# Patient Record
Sex: Female | Born: 1935 | Race: White | Hispanic: No | Smoking: Former smoker
Health system: Southern US, Community
[De-identification: ages and names within clinical notes are randomized; demographics above are authoritative.]

## PROBLEM LIST (undated history)

## (undated) DIAGNOSIS — C49A0 Gastrointestinal stromal tumor, unspecified site: Secondary | ICD-10-CM

## (undated) DIAGNOSIS — C679 Malignant neoplasm of bladder, unspecified: Secondary | ICD-10-CM

## (undated) DIAGNOSIS — E039 Hypothyroidism, unspecified: Secondary | ICD-10-CM

## (undated) DIAGNOSIS — K3189 Other diseases of stomach and duodenum: Secondary | ICD-10-CM

## (undated) DIAGNOSIS — I10 Essential (primary) hypertension: Secondary | ICD-10-CM

## (undated) DIAGNOSIS — C4490 Unspecified malignant neoplasm of skin, unspecified: Secondary | ICD-10-CM

## (undated) DIAGNOSIS — I499 Cardiac arrhythmia, unspecified: Secondary | ICD-10-CM

## (undated) DIAGNOSIS — K2270 Barrett's esophagus without dysplasia: Secondary | ICD-10-CM

## (undated) DIAGNOSIS — R413 Other amnesia: Principal | ICD-10-CM

## (undated) HISTORY — DX: Other amnesia: R41.3

## (undated) HISTORY — DX: Barrett's esophagus without dysplasia: K22.70

## (undated) HISTORY — PX: OTHER SURGICAL HISTORY: SHX169

## (undated) HISTORY — DX: Unspecified malignant neoplasm of skin, unspecified: C44.90

## (undated) HISTORY — PX: UPPER GASTROINTESTINAL ENDOSCOPY: SHX188

## (undated) HISTORY — PX: COLONOSCOPY: SHX174

## (undated) HISTORY — PX: CATARACT EXTRACTION, BILATERAL: SHX1313

## (undated) HISTORY — DX: Malignant neoplasm of bladder, unspecified: C67.9

## (undated) HISTORY — DX: Gastrointestinal stromal tumor, unspecified site: C49.A0

---

## 1997-04-15 ENCOUNTER — Other Ambulatory Visit: Admission: RE | Admit: 1997-04-15 | Discharge: 1997-04-15 | Payer: Self-pay | Admitting: Internal Medicine

## 1998-10-15 ENCOUNTER — Ambulatory Visit (HOSPITAL_COMMUNITY): Admission: RE | Admit: 1998-10-15 | Discharge: 1998-10-15 | Payer: Self-pay | Admitting: *Deleted

## 1998-10-15 ENCOUNTER — Encounter (INDEPENDENT_AMBULATORY_CARE_PROVIDER_SITE_OTHER): Payer: Self-pay | Admitting: Specialist

## 1999-04-18 ENCOUNTER — Encounter: Admission: RE | Admit: 1999-04-18 | Discharge: 1999-04-18 | Payer: Self-pay | Admitting: Internal Medicine

## 1999-04-18 ENCOUNTER — Encounter: Payer: Self-pay | Admitting: Internal Medicine

## 2000-04-19 ENCOUNTER — Encounter: Admission: RE | Admit: 2000-04-19 | Discharge: 2000-04-19 | Payer: Self-pay | Admitting: Internal Medicine

## 2000-04-19 ENCOUNTER — Encounter: Payer: Self-pay | Admitting: Internal Medicine

## 2000-05-24 ENCOUNTER — Other Ambulatory Visit: Admission: RE | Admit: 2000-05-24 | Discharge: 2000-05-24 | Payer: Self-pay | Admitting: Internal Medicine

## 2000-06-08 ENCOUNTER — Other Ambulatory Visit: Admission: RE | Admit: 2000-06-08 | Discharge: 2000-06-08 | Payer: Self-pay | Admitting: Obstetrics and Gynecology

## 2000-06-08 ENCOUNTER — Encounter (INDEPENDENT_AMBULATORY_CARE_PROVIDER_SITE_OTHER): Payer: Self-pay

## 2001-04-10 ENCOUNTER — Encounter: Payer: Self-pay | Admitting: Internal Medicine

## 2001-04-10 ENCOUNTER — Encounter: Admission: RE | Admit: 2001-04-10 | Discharge: 2001-04-10 | Payer: Self-pay | Admitting: Internal Medicine

## 2001-11-02 ENCOUNTER — Encounter: Admission: RE | Admit: 2001-11-02 | Discharge: 2001-11-02 | Payer: Self-pay | Admitting: Orthopedic Surgery

## 2001-11-02 ENCOUNTER — Encounter: Payer: Self-pay | Admitting: Orthopedic Surgery

## 2002-04-25 ENCOUNTER — Encounter: Admission: RE | Admit: 2002-04-25 | Discharge: 2002-04-25 | Payer: Self-pay | Admitting: Internal Medicine

## 2002-04-25 ENCOUNTER — Encounter: Payer: Self-pay | Admitting: Internal Medicine

## 2003-06-22 ENCOUNTER — Other Ambulatory Visit: Admission: RE | Admit: 2003-06-22 | Discharge: 2003-06-22 | Payer: Self-pay | Admitting: Internal Medicine

## 2004-05-25 ENCOUNTER — Encounter: Admission: RE | Admit: 2004-05-25 | Discharge: 2004-05-25 | Payer: Self-pay | Admitting: Internal Medicine

## 2005-08-03 ENCOUNTER — Encounter: Admission: RE | Admit: 2005-08-03 | Discharge: 2005-08-03 | Payer: Self-pay | Admitting: Internal Medicine

## 2006-08-21 ENCOUNTER — Encounter: Admission: RE | Admit: 2006-08-21 | Discharge: 2006-08-21 | Payer: Self-pay | Admitting: Internal Medicine

## 2007-09-25 ENCOUNTER — Encounter: Admission: RE | Admit: 2007-09-25 | Discharge: 2007-09-25 | Payer: Self-pay | Admitting: Internal Medicine

## 2008-10-21 ENCOUNTER — Encounter: Admission: RE | Admit: 2008-10-21 | Discharge: 2008-10-21 | Payer: Self-pay | Admitting: Internal Medicine

## 2009-10-22 ENCOUNTER — Encounter: Admission: RE | Admit: 2009-10-22 | Discharge: 2009-10-22 | Payer: Self-pay | Admitting: Internal Medicine

## 2010-11-09 ENCOUNTER — Other Ambulatory Visit: Payer: Self-pay | Admitting: Internal Medicine

## 2010-11-09 DIAGNOSIS — Z1231 Encounter for screening mammogram for malignant neoplasm of breast: Secondary | ICD-10-CM

## 2010-11-18 ENCOUNTER — Ambulatory Visit
Admission: RE | Admit: 2010-11-18 | Discharge: 2010-11-18 | Disposition: A | Discharge status: Home / Self Care | Payer: Medicare Other | Source: Ambulatory Visit | Attending: Internal Medicine | Admitting: Internal Medicine

## 2010-11-18 DIAGNOSIS — Z1231 Encounter for screening mammogram for malignant neoplasm of breast: Secondary | ICD-10-CM

## 2011-04-06 DIAGNOSIS — I10 Essential (primary) hypertension: Secondary | ICD-10-CM | POA: Diagnosis not present

## 2011-06-13 DIAGNOSIS — I10 Essential (primary) hypertension: Secondary | ICD-10-CM | POA: Diagnosis not present

## 2011-06-13 DIAGNOSIS — K649 Unspecified hemorrhoids: Secondary | ICD-10-CM | POA: Diagnosis not present

## 2011-06-13 DIAGNOSIS — K5900 Constipation, unspecified: Secondary | ICD-10-CM | POA: Diagnosis not present

## 2011-07-05 DIAGNOSIS — K625 Hemorrhage of anus and rectum: Secondary | ICD-10-CM | POA: Diagnosis not present

## 2011-07-05 DIAGNOSIS — K623 Rectal prolapse: Secondary | ICD-10-CM | POA: Diagnosis not present

## 2011-07-05 DIAGNOSIS — K648 Other hemorrhoids: Secondary | ICD-10-CM | POA: Diagnosis not present

## 2011-07-05 DIAGNOSIS — K6289 Other specified diseases of anus and rectum: Secondary | ICD-10-CM | POA: Diagnosis not present

## 2011-07-18 DIAGNOSIS — I10 Essential (primary) hypertension: Secondary | ICD-10-CM | POA: Diagnosis not present

## 2011-07-19 DIAGNOSIS — K648 Other hemorrhoids: Secondary | ICD-10-CM | POA: Diagnosis not present

## 2011-07-19 DIAGNOSIS — K602 Anal fissure, unspecified: Secondary | ICD-10-CM | POA: Diagnosis not present

## 2011-08-02 DIAGNOSIS — K648 Other hemorrhoids: Secondary | ICD-10-CM | POA: Diagnosis not present

## 2011-08-22 DIAGNOSIS — Z961 Presence of intraocular lens: Secondary | ICD-10-CM | POA: Diagnosis not present

## 2011-10-02 DIAGNOSIS — Z23 Encounter for immunization: Secondary | ICD-10-CM | POA: Diagnosis not present

## 2011-11-08 DIAGNOSIS — L570 Actinic keratosis: Secondary | ICD-10-CM | POA: Diagnosis not present

## 2011-11-08 DIAGNOSIS — Z85820 Personal history of malignant melanoma of skin: Secondary | ICD-10-CM | POA: Diagnosis not present

## 2011-11-08 DIAGNOSIS — D235 Other benign neoplasm of skin of trunk: Secondary | ICD-10-CM | POA: Diagnosis not present

## 2011-12-14 ENCOUNTER — Other Ambulatory Visit: Payer: Self-pay | Admitting: Internal Medicine

## 2011-12-14 DIAGNOSIS — Z1231 Encounter for screening mammogram for malignant neoplasm of breast: Secondary | ICD-10-CM

## 2012-01-19 ENCOUNTER — Ambulatory Visit: Payer: Medicare Other

## 2012-02-15 DIAGNOSIS — Z0000 Encounter for general adult medical examination without abnormal findings: Secondary | ICD-10-CM | POA: Diagnosis not present

## 2012-02-15 DIAGNOSIS — M899 Disorder of bone, unspecified: Secondary | ICD-10-CM | POA: Diagnosis not present

## 2012-02-15 DIAGNOSIS — R7989 Other specified abnormal findings of blood chemistry: Secondary | ICD-10-CM | POA: Diagnosis not present

## 2012-02-15 DIAGNOSIS — M949 Disorder of cartilage, unspecified: Secondary | ICD-10-CM | POA: Diagnosis not present

## 2012-02-15 DIAGNOSIS — I10 Essential (primary) hypertension: Secondary | ICD-10-CM | POA: Diagnosis not present

## 2012-02-15 DIAGNOSIS — E039 Hypothyroidism, unspecified: Secondary | ICD-10-CM | POA: Diagnosis not present

## 2012-02-20 DIAGNOSIS — E039 Hypothyroidism, unspecified: Secondary | ICD-10-CM | POA: Diagnosis not present

## 2012-02-20 DIAGNOSIS — I10 Essential (primary) hypertension: Secondary | ICD-10-CM | POA: Diagnosis not present

## 2012-02-20 DIAGNOSIS — Z8249 Family history of ischemic heart disease and other diseases of the circulatory system: Secondary | ICD-10-CM | POA: Diagnosis not present

## 2012-02-20 DIAGNOSIS — M899 Disorder of bone, unspecified: Secondary | ICD-10-CM | POA: Diagnosis not present

## 2012-02-27 DIAGNOSIS — M810 Age-related osteoporosis without current pathological fracture: Secondary | ICD-10-CM | POA: Diagnosis not present

## 2012-09-19 DIAGNOSIS — Z23 Encounter for immunization: Secondary | ICD-10-CM | POA: Diagnosis not present

## 2013-02-19 DIAGNOSIS — Z0000 Encounter for general adult medical examination without abnormal findings: Secondary | ICD-10-CM | POA: Diagnosis not present

## 2013-02-19 DIAGNOSIS — I10 Essential (primary) hypertension: Secondary | ICD-10-CM | POA: Diagnosis not present

## 2013-02-19 DIAGNOSIS — M899 Disorder of bone, unspecified: Secondary | ICD-10-CM | POA: Diagnosis not present

## 2013-02-19 DIAGNOSIS — E039 Hypothyroidism, unspecified: Secondary | ICD-10-CM | POA: Diagnosis not present

## 2013-02-19 DIAGNOSIS — M949 Disorder of cartilage, unspecified: Secondary | ICD-10-CM | POA: Diagnosis not present

## 2013-02-19 DIAGNOSIS — Z8249 Family history of ischemic heart disease and other diseases of the circulatory system: Secondary | ICD-10-CM | POA: Diagnosis not present

## 2013-02-19 DIAGNOSIS — N390 Urinary tract infection, site not specified: Secondary | ICD-10-CM | POA: Diagnosis not present

## 2013-02-24 DIAGNOSIS — M949 Disorder of cartilage, unspecified: Secondary | ICD-10-CM | POA: Diagnosis not present

## 2013-02-24 DIAGNOSIS — M899 Disorder of bone, unspecified: Secondary | ICD-10-CM | POA: Diagnosis not present

## 2013-02-24 DIAGNOSIS — Z1212 Encounter for screening for malignant neoplasm of rectum: Secondary | ICD-10-CM | POA: Diagnosis not present

## 2013-02-24 DIAGNOSIS — E039 Hypothyroidism, unspecified: Secondary | ICD-10-CM | POA: Diagnosis not present

## 2013-02-24 DIAGNOSIS — M549 Dorsalgia, unspecified: Secondary | ICD-10-CM | POA: Diagnosis not present

## 2013-02-24 DIAGNOSIS — Z2911 Encounter for prophylactic immunotherapy for respiratory syncytial virus (RSV): Secondary | ICD-10-CM | POA: Diagnosis not present

## 2013-02-24 DIAGNOSIS — I10 Essential (primary) hypertension: Secondary | ICD-10-CM | POA: Diagnosis not present

## 2013-03-10 DIAGNOSIS — M6530 Trigger finger, unspecified finger: Secondary | ICD-10-CM | POA: Diagnosis not present

## 2013-03-10 DIAGNOSIS — I10 Essential (primary) hypertension: Secondary | ICD-10-CM | POA: Diagnosis not present

## 2013-03-19 ENCOUNTER — Encounter: Payer: Self-pay | Admitting: Hematology & Oncology

## 2013-03-25 ENCOUNTER — Ambulatory Visit: Payer: Medicare Other | Admitting: Hematology & Oncology

## 2013-03-25 ENCOUNTER — Ambulatory Visit: Payer: Medicare Other

## 2013-03-25 ENCOUNTER — Other Ambulatory Visit: Payer: Medicare Other | Admitting: Lab

## 2013-04-01 DIAGNOSIS — M6530 Trigger finger, unspecified finger: Secondary | ICD-10-CM | POA: Diagnosis not present

## 2013-04-01 DIAGNOSIS — M79609 Pain in unspecified limb: Secondary | ICD-10-CM | POA: Diagnosis not present

## 2013-04-08 ENCOUNTER — Telehealth: Payer: Self-pay | Admitting: Hematology & Oncology

## 2013-04-08 NOTE — Telephone Encounter (Signed)
Spoke w NEW PATIENT today to remind them of their appointment with Dr. Ennever. Also, advised them to bring all meds and insurance information. ° °

## 2013-04-09 ENCOUNTER — Ambulatory Visit (HOSPITAL_BASED_OUTPATIENT_CLINIC_OR_DEPARTMENT_OTHER): Payer: Medicare Other | Admitting: Hematology & Oncology

## 2013-04-09 ENCOUNTER — Other Ambulatory Visit (HOSPITAL_BASED_OUTPATIENT_CLINIC_OR_DEPARTMENT_OTHER): Payer: Medicare Other | Admitting: Lab

## 2013-04-09 ENCOUNTER — Ambulatory Visit: Payer: Medicare Other

## 2013-04-09 VITALS — BP 141/56 | HR 95 | Temp 98.4°F | Resp 16 | Wt 152.0 lb

## 2013-04-09 DIAGNOSIS — D696 Thrombocytopenia, unspecified: Secondary | ICD-10-CM

## 2013-04-09 LAB — CBC WITH DIFFERENTIAL (CANCER CENTER ONLY)
BASO#: 0 10*3/uL (ref 0.0–0.2)
BASO%: 0.3 % (ref 0.0–2.0)
EOS ABS: 0.1 10*3/uL (ref 0.0–0.5)
EOS%: 0.7 % (ref 0.0–7.0)
HCT: 40.9 % (ref 34.8–46.6)
HEMOGLOBIN: 13.8 g/dL (ref 11.6–15.9)
LYMPH#: 1.6 10*3/uL (ref 0.9–3.3)
LYMPH%: 23.5 % (ref 14.0–48.0)
MCH: 31.3 pg (ref 26.0–34.0)
MCHC: 33.7 g/dL (ref 32.0–36.0)
MCV: 93 fL (ref 81–101)
MONO#: 0.6 10*3/uL (ref 0.1–0.9)
MONO%: 8.3 % (ref 0.0–13.0)
NEUT#: 4.6 10*3/uL (ref 1.5–6.5)
NEUT%: 67.2 % (ref 39.6–80.0)
Platelets: 214 10*3/uL (ref 145–400)
RBC: 4.41 10*6/uL (ref 3.70–5.32)
RDW: 12.8 % (ref 11.1–15.7)
WBC: 6.9 10*3/uL (ref 3.9–10.0)

## 2013-04-09 LAB — CHCC SATELLITE - SMEAR

## 2013-04-09 NOTE — Progress Notes (Signed)
Referral MD  Reason for Referral: Thrombocytopenia   Chief Complaint  Patient presents with  . New patient  : I was told to come here today because my blood is low.  HPI: Victoria Patrick is a very kind 80 year old female. She is followed by Dr. Shelia Media. He has noted that she's had some thrombocytopenia over the past year. He did work on her back in February of 2014. This showed a platelet count of 67,000. She had a normal white cell and red cell count.  This year, he, repeated a CBC which showed a white cell count of 5300. Hemoglobin was 13.9. Platelet count was 72,000. Her MCV was 92. She had a relatively normal electrolytes.  She's asymptomatic. She's had no bleeding. Has not noted any bruising.  She's not a vegetarian. There's been no weight loss weight gain. She's had no nausea vomiting. There's been no headache. There's been no leg swelling. She's had no rashes.  She was, and referred to the wasn't [place] for an evaluation.  She's had no change in bowel or bladder habits. There's been no cough. She's had no recent infections. She recently did have a steroid injection for a trigger finger.  Otherwise, she's been incredibly healthy. She's been active. She's been doing things outside.   No past medical history on file.:  No past surgical history on file.:  Current outpatient prescriptions:calcium-vitamin D (OSCAL WITH D) 500-200 MG-UNIT per tablet, Take 1 tablet by mouth., Disp: , Rfl: ;  Cholecalciferol (VITAMIN D-3) 5000 UNITS TABS, Take 5,000 Units by mouth daily., Disp: , Rfl: ;  losartan-hydrochlorothiazide (HYZAAR) 100-12.5 MG per tablet, Take 1 tablet by mouth daily., Disp: , Rfl: ;  Multiple Vitamin (MULTIVITAMIN) capsule, Take 1 capsule by mouth daily., Disp: , Rfl:  Multiple Vitamins-Minerals (PRESERVISION AREDS PO), Take by mouth. Take as directed, Disp: , Rfl: ;  Omega-3 Fatty Acids (FISH OIL) 1000 MG CAPS, Take by mouth daily. Two capsules daily, Disp: , Rfl: ;   SYNTHROID 88 MCG tablet, Take 88 mcg by mouth daily., Disp: , Rfl: :  :  Allergies  Allergen Reactions  . Diovan [Valsartan] Itching  :  No family history on file.:  History   Social History  . Marital Status: Widowed    Spouse Name: N/A    Number of Children: N/A  . Years of Education: N/A   Occupational History  . Not on file.   Social History Main Topics  . Smoking status: Not on file  . Smokeless tobacco: Not on file  . Alcohol Use: Not on file  . Drug Use: Not on file  . Sexual Activity: Not on file   Other Topics Concern  . Not on file   Social History Narrative  . No narrative on file  :  Pertinent items are noted in HPI.  Exam: @IPVITALS @  this is a well-developed well-nourished white female who is somewhat elderly. She certainly does not look her age. Her vital signs show temperature of 98 6. Pulse 82. Respiratory 18 blood pressure is 141/56. Weight 152 pounds.  Head and neck exam is normocephalic intra-axial. Is no ocular or oral lesions. There is no scleral icterus. There is no adenopathy in the neck. Thyroid is not palpable. Lungs are clear. Cardiac exam regular rate and rhythm with a normal S1 and S2. There are no murmurs rubs or bruits. Abdomen is soft. Has good bowel sounds. There is no fluid wave. There is no palpable abdominal mass. There is a  palpable hepatosplenomegaly. Back exam no tenderness over the spine ribs or hips. Extremities shows no clubbing cyanosis or edema. She has good range of motion of her joints. She has some osteophytic changes. There is no joint swelling or redness. Skin exam shows no rashes ecchymosis or petechia. Neurological exam shows no focal neurological deficits.    Recent Labs  04/09/13 1442  WBC 6.9  HGB 13.8  HCT 40.9  PLT 214 Platelet count consistent in citrate   No results found for this basename: NA, K, CL, CO2, GLUCOSE, BUN, CREATININE, CALCIUM,  in the last 72 hours  Blood smear review: Normochromic and  normocytic red blood cells. There are no nucleated red blood cells. I see no schistocytes or spherocytes. She has no rouleau formation. There are no nucleated red blood cells. A few teardrop cells. There are no target cells. White cells are normal in morphology maturation. She has no immature myeloid or lymphoid forms. There is no atypical lymphocytes. Platelets are adequate number size. I do not see any large platelets. Platelets are well granulated.  Pathology: Not available     Assessment and Plan: Victoria Patrick is a very charming 80 year old white female with transient thrombocytopenia. Again I do not see anything today that would suggest, thrombocytopenia. Her blood smear looks normal. Her physical exam is unremarkable. I cannot palpate a liver or spleen. She has no risk factors for hepatitis or HIV. I cannot palpate any lymphadenopathy.  I repeated the CBC and again the platelet count still came out about 200,000.  Again I don't have a real explanation as to why the platelet count was low with Dr. Shelia Media. He is very thorough. I don't see any thing on her medication list that would be an issue.  At 80 years old, she certainly looks great. She is totally asymptomatic. I just don't think we have to get her back to see [place].  I spent a good 45 minutes with her. I went over her lab work. I explained to her why she was sent over here.  I gave her a prayer blanket. She is very appreciative of this..  I will be more than happy to see her back again if there are any issues.

## 2013-04-14 DIAGNOSIS — E039 Hypothyroidism, unspecified: Secondary | ICD-10-CM | POA: Diagnosis not present

## 2013-05-21 DIAGNOSIS — L570 Actinic keratosis: Secondary | ICD-10-CM | POA: Diagnosis not present

## 2013-05-21 DIAGNOSIS — D235 Other benign neoplasm of skin of trunk: Secondary | ICD-10-CM | POA: Diagnosis not present

## 2013-05-21 DIAGNOSIS — L820 Inflamed seborrheic keratosis: Secondary | ICD-10-CM | POA: Diagnosis not present

## 2013-05-21 DIAGNOSIS — Z85820 Personal history of malignant melanoma of skin: Secondary | ICD-10-CM | POA: Diagnosis not present

## 2013-10-01 DIAGNOSIS — Z23 Encounter for immunization: Secondary | ICD-10-CM | POA: Diagnosis not present

## 2013-11-11 DIAGNOSIS — K622 Anal prolapse: Secondary | ICD-10-CM | POA: Diagnosis not present

## 2013-11-11 DIAGNOSIS — K5900 Constipation, unspecified: Secondary | ICD-10-CM | POA: Diagnosis not present

## 2013-12-12 DIAGNOSIS — R319 Hematuria, unspecified: Secondary | ICD-10-CM | POA: Diagnosis not present

## 2014-03-11 DIAGNOSIS — R634 Abnormal weight loss: Secondary | ICD-10-CM | POA: Diagnosis not present

## 2014-03-11 DIAGNOSIS — R319 Hematuria, unspecified: Secondary | ICD-10-CM | POA: Diagnosis not present

## 2014-04-01 DIAGNOSIS — C672 Malignant neoplasm of lateral wall of bladder: Secondary | ICD-10-CM | POA: Diagnosis not present

## 2014-04-01 DIAGNOSIS — C670 Malignant neoplasm of trigone of bladder: Secondary | ICD-10-CM | POA: Diagnosis not present

## 2014-04-01 DIAGNOSIS — N858 Other specified noninflammatory disorders of uterus: Secondary | ICD-10-CM | POA: Diagnosis not present

## 2014-04-01 DIAGNOSIS — N3289 Other specified disorders of bladder: Secondary | ICD-10-CM | POA: Diagnosis not present

## 2014-04-01 DIAGNOSIS — R310 Gross hematuria: Secondary | ICD-10-CM | POA: Diagnosis not present

## 2014-04-01 DIAGNOSIS — N1330 Unspecified hydronephrosis: Secondary | ICD-10-CM | POA: Diagnosis not present

## 2014-04-07 ENCOUNTER — Other Ambulatory Visit: Payer: Self-pay | Admitting: Urology

## 2014-04-07 DIAGNOSIS — N83201 Unspecified ovarian cyst, right side: Secondary | ICD-10-CM

## 2014-04-08 DIAGNOSIS — I10 Essential (primary) hypertension: Secondary | ICD-10-CM | POA: Diagnosis not present

## 2014-04-08 DIAGNOSIS — N390 Urinary tract infection, site not specified: Secondary | ICD-10-CM | POA: Diagnosis not present

## 2014-04-08 DIAGNOSIS — E039 Hypothyroidism, unspecified: Secondary | ICD-10-CM | POA: Diagnosis not present

## 2014-04-10 ENCOUNTER — Ambulatory Visit (HOSPITAL_COMMUNITY)
Admission: RE | Admit: 2014-04-10 | Discharge: 2014-04-10 | Disposition: A | Discharge status: Home / Self Care | Payer: Medicare Other | Source: Ambulatory Visit | Attending: Urology | Admitting: Urology

## 2014-04-10 ENCOUNTER — Other Ambulatory Visit: Payer: Self-pay | Admitting: Urology

## 2014-04-10 DIAGNOSIS — N8329 Other ovarian cysts: Secondary | ICD-10-CM | POA: Diagnosis not present

## 2014-04-10 DIAGNOSIS — N854 Malposition of uterus: Secondary | ICD-10-CM | POA: Diagnosis not present

## 2014-04-10 DIAGNOSIS — N83201 Unspecified ovarian cyst, right side: Secondary | ICD-10-CM

## 2014-04-10 DIAGNOSIS — N838 Other noninflammatory disorders of ovary, fallopian tube and broad ligament: Secondary | ICD-10-CM | POA: Diagnosis not present

## 2014-04-10 DIAGNOSIS — D259 Leiomyoma of uterus, unspecified: Secondary | ICD-10-CM | POA: Diagnosis not present

## 2014-04-15 DIAGNOSIS — R9431 Abnormal electrocardiogram [ECG] [EKG]: Secondary | ICD-10-CM | POA: Diagnosis not present

## 2014-04-15 DIAGNOSIS — Z01818 Encounter for other preprocedural examination: Secondary | ICD-10-CM | POA: Diagnosis not present

## 2014-04-15 DIAGNOSIS — C672 Malignant neoplasm of lateral wall of bladder: Secondary | ICD-10-CM | POA: Diagnosis not present

## 2014-04-15 DIAGNOSIS — Z1212 Encounter for screening for malignant neoplasm of rectum: Secondary | ICD-10-CM | POA: Diagnosis not present

## 2014-04-15 DIAGNOSIS — I10 Essential (primary) hypertension: Secondary | ICD-10-CM | POA: Diagnosis not present

## 2014-04-21 DIAGNOSIS — R9431 Abnormal electrocardiogram [ECG] [EKG]: Secondary | ICD-10-CM | POA: Diagnosis not present

## 2014-04-21 DIAGNOSIS — Z01810 Encounter for preprocedural cardiovascular examination: Secondary | ICD-10-CM | POA: Diagnosis not present

## 2014-04-21 DIAGNOSIS — I10 Essential (primary) hypertension: Secondary | ICD-10-CM | POA: Diagnosis not present

## 2014-04-22 NOTE — Progress Notes (Signed)
LOV- 04/10/2014 in EPIC - Dr Martha Clan- thrombocytopenia.

## 2014-04-22 NOTE — Patient Instructions (Signed)
Victoria Patrick  04/22/2014   Your procedure is scheduled on:    04/30/2014    Report to [facility] Main  Entrance and follow signs to               [place] at      [place] AM.  Call this number if you have problems the morning of surgery [phone]   Remember:  Do not eat food or drink liquids :After Midnight.     Take these medicines the morning of surgery with A SIP OF WATER:   Synthroid                                You may not have any metal on your body including hair pins and              piercings  Do not wear jewelry, make-up, lotions, powders or perfumes., deodorant.               Do not wear nail polish.  Do not shave  48 hours prior to surgery.                 Do not bring valuables to the hospital. [place].  Contacts, dentures or bridgework may not be worn into surgery.  Leave suitcase in the car. After surgery it may be brought to your room.         Special Instructions: coughing and deep breathing exercises, leg exercises               Please read over the following fact sheets you were given: _____________________________________________________________________             [facility] - Preparing for Surgery Before surgery, you can play an important role.  Because skin is not sterile, your skin needs to be as free of germs as possible.  You can reduce the number of germs on your skin by washing with CHG (chlorahexidine gluconate) soap before surgery.  CHG is an antiseptic cleaner which kills germs and bonds with the skin to continue killing germs even after washing. Please DO NOT use if you have an allergy to CHG or antibacterial soaps.  If your skin becomes reddened/irritated stop using the CHG and inform your nurse when you arrive at Short Stay. Do not shave (including legs and underarms) for at least 48 hours prior to the first CHG shower.  You may shave your face/neck. Please  follow these instructions carefully:  1.  Shower with CHG Soap the night before surgery and the  morning of Surgery.  2.  If you choose to wash your hair, wash your hair first as usual with your  normal  shampoo.  3.  After you shampoo, rinse your hair and body thoroughly to remove the  shampoo.                           4.  Use CHG as you would any other liquid soap.  You can apply chg directly  to the skin and wash                       Gently with  a scrungie or clean washcloth.  5.  Apply the CHG Soap to your body ONLY FROM THE NECK DOWN.   Do not use on face/ open                           Wound or open sores. Avoid contact with eyes, ears mouth and genitals (private parts).                       Wash face,  Genitals (private parts) with your normal soap.             6.  Wash thoroughly, paying special attention to the area where your surgery  will be performed.  7.  Thoroughly rinse your body with warm water from the neck down.  8.  DO NOT shower/wash with your normal soap after using and rinsing off  the CHG Soap.                9.  Pat yourself dry with a clean towel.            10.  Wear clean pajamas.            11.  Place clean sheets on your bed the night of your first shower and do not  sleep with pets. Day of Surgery : Do not apply any lotions/deodorants the morning of surgery.  Please wear clean clothes to the hospital/surgery center.  FAILURE TO FOLLOW THESE INSTRUCTIONS MAY RESULT IN THE CANCELLATION OF YOUR SURGERY PATIENT SIGNATURE_________________________________  NURSE SIGNATURE__________________________________  ________________________________________________________________________

## 2014-04-22 NOTE — Progress Notes (Addendum)
Requested and received from office of Dr Einar Gip office visit dated 04/21/2014  With preop clearance in note and highlighted and placed on chart.  EKG- 04/15/2014 on chart.

## 2014-04-23 ENCOUNTER — Encounter (HOSPITAL_COMMUNITY)
Admission: RE | Admit: 2014-04-23 | Discharge: 2014-04-23 | Disposition: A | Discharge status: Home / Self Care | Payer: Medicare Other | Source: Ambulatory Visit | Attending: Urology | Admitting: Urology

## 2014-04-23 ENCOUNTER — Encounter (HOSPITAL_COMMUNITY): Payer: Self-pay

## 2014-04-23 DIAGNOSIS — Z01818 Encounter for other preprocedural examination: Secondary | ICD-10-CM | POA: Diagnosis not present

## 2014-04-23 HISTORY — DX: Cardiac arrhythmia, unspecified: I49.9

## 2014-04-23 HISTORY — DX: Essential (primary) hypertension: I10

## 2014-04-23 HISTORY — DX: Hypothyroidism, unspecified: E03.9

## 2014-04-23 LAB — CBC
HEMATOCRIT: 39 % (ref 36.0–46.0)
HEMOGLOBIN: 13.3 g/dL (ref 12.0–15.0)
MCH: 30.8 pg (ref 26.0–34.0)
MCHC: 34.1 g/dL (ref 30.0–36.0)
MCV: 90.3 fL (ref 78.0–100.0)
Platelets: ADEQUATE 10*3/uL (ref 150–400)
RBC: 4.32 MIL/uL (ref 3.87–5.11)
RDW: 12.2 % (ref 11.5–15.5)
WBC: 6 10*3/uL (ref 4.0–10.5)

## 2014-04-23 LAB — BASIC METABOLIC PANEL
Anion gap: 9 (ref 5–15)
BUN: 17 mg/dL (ref 6–23)
CO2: 28 mmol/L (ref 19–32)
Calcium: 9.3 mg/dL (ref 8.4–10.5)
Chloride: 96 mmol/L (ref 96–112)
Creatinine, Ser: 0.96 mg/dL (ref 0.50–1.10)
GFR calc Af Amer: 59 mL/min — ABNORMAL LOW (ref >90)
GFR calc non Af Amer: 51 mL/min — ABNORMAL LOW (ref >90)
Glucose, Bld: 174 mg/dL — ABNORMAL HIGH (ref 70–99)
POTASSIUM: 3.7 mmol/L (ref 3.5–5.1)
SODIUM: 133 mmol/L — AB (ref 135–145)

## 2014-04-23 NOTE — Progress Notes (Signed)
CBC result done 04/23/2014 faxed via EPIC to Dr Irine Seal.

## 2014-04-23 NOTE — Progress Notes (Addendum)
EKG- 04/15/2014 on chart  LOV with Dr Einar Gip 04/21/2014 on chart with clearance in note.  LOV with Dr Oliver Pila in [facility] on 04/09/2013.

## 2014-04-28 NOTE — H&P (Signed)
Active Problems Problems  1. Cyst of right ovary (N83.20) 2. Gross hematuria (R31.0) 3. Hydronephrosis, left (N13.30) 4. 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 Shelia Media for hematuria. She saw gross hematuria in December for a few days that then resolved.  She had no pain or dysuria associated with the bleeding. She was given something for 5 days that was an antibiotic but she doesn't know the name. She got a second round when the bleeding recurred.  A culture was sent but I don't have that result. She has no voiding complaints or flank pain and has had no further gross hematuria since December.  Her UA today shows microhematuria. She has no prior GU history. She was smoker for about 25 years but she quit about 35 years ago.   Past Medical History Problems  1. History of Abnormal finding on EKG (R94.31) 2. History of Anal fissure (K60.2) 3. History of Chronic back pain (M54.9,G89.29) 4. History of cardiac disorder (Z86.79) 5. History of constipation (Z87.19) 6. History of hemorrhoids (Z87.19) 7. History of hypertension (Z86.79) 8. History of hypothyroidism (Z86.39) 9. History of malignant neoplasm of skin (Z85.828) 10. History of osteopenia (Z87.39) 11. History of thrombocytopenia (Z86.2) 12. History of Prediabetes (R73.09)  Surgical History Problems  1. History of No Surgical Problems  Current Meds 1. Calcium + D TABS;  Therapy: (Recorded:23Mar2016) to Recorded 2. Fish Oil CAPS;  Therapy: (Recorded:23Mar2016) to Recorded 3. Levothyroxine Sodium 88 MCG Oral Tablet;  Therapy: (Recorded:23Mar2016) to Recorded 4. Losartan Potassium-HCTZ 100-12.5 MG Oral Tablet;  Therapy: (Recorded:23Mar2016) to Recorded 5. Multi-Vitamin TABS;  Therapy: (Recorded:23Mar2016) to Recorded 6. PreserVision AREDS Oral Capsule;  Therapy: (Recorded:23Mar2016)  to Recorded 7. Vitamin D3 5000 UNIT Oral Capsule;  Therapy: (Recorded:23Mar2016) to Recorded  Allergies Medication  1. Codeine Derivatives 2. Diovan TABS 3. Lisinopril TABS  Family History Problems  1. Family history of cardiac disorder (Z82.49) : Father 2. Family history of congestive heart failure (Z82.49) : Mother  Social History Problems    Denied: History of Alcohol use   Caffeine use (F15.90)   2 a day   Former smoker [phone])   Number of children   None   Retired   Widowed  Review of Systems Genitourinary, constitutional, skin, eye, otolaryngeal, hematologic/lymphatic, cardiovascular, pulmonary, endocrine, musculoskeletal, gastrointestinal, neurological and psychiatric system(s) were reviewed and pertinent findings if present are noted and are otherwise negative.  Genitourinary: nocturia.  Constitutional: recent weight loss.    Vitals Vital Signs [Data Includes: Last 1 Day]  Recorded: 23Mar2016 09:06AM  Height: 5 ft 7.75 in Weight: 130 lb  BMI Calculated: 19.91 BSA Calculated: 1.7 Blood Pressure: 152 / 78 Temperature: 97.7 F Heart Rate: 86  Physical Exam Constitutional: Well nourished and well developed . No acute distress.  ENT:. The ears and nose are normal in appearance.  Neck: The appearance of the neck is normal and no neck mass is present.  Pulmonary: No respiratory distress and normal respiratory rhythm and effort.  Cardiovascular: Heart rate and rhythm are normal . No peripheral edema.  Abdomen: The abdomen is soft and nontender. No masses are palpated. No CVA tenderness. No hernias are palpable. No hepatosplenomegaly noted.  Genitourinary:   Examination of the external genitalia shows vulvar atrophy (severe stenosis). The urethra is normal in appearance. Urethral hypermobility is not present. Vaginal exam demonstrates atrophy and the vaginal epithelium  to be poorly estrogenized, but no uterine prolapse. No cystocele is identified. No  rectocele is identified. The cervix is is without abnormalities (but I am unable to reach above the cervix). The adnexa are palpably normal. The bladder is normal on palpation, non tender and not distended. The anus is normal on inspection. The perineum is normal on inspection.  Lymphatics: The posterior cervical, supraclavicular, femoral and inguinal nodes are not enlarged or tender.  Skin: Normal skin turgor, no visible rash and no visible skin lesions.  Neuro/Psych:. Mood and affect are appropriate. Normal sensation of the perineum/perianal region (S3,4,5).    Results/Data  Urine [Data Includes: Last 1 Day]   [id number]  COLOR YELLOW   APPEARANCE CLEAR   SPECIFIC GRAVITY 1.010   pH 7.0   GLUCOSE NEG mg/dL  BILIRUBIN NEG   KETONE NEG mg/dL  BLOOD LARGE   PROTEIN TRACE mg/dL  UROBILINOGEN 0.2 mg/dL  NITRITE NEG   LEUKOCYTE ESTERASE SMALL   SQUAMOUS EPITHELIAL/HPF NONE SEEN   WBC 3-6 WBC/hpf  RBC 21-50 RBC/hpf  BACTERIA RARE   CRYSTALS NONE SEEN   CASTS NONE SEEN   Other OVAL FAT BODIES NOTED    Old records or history reviewed: I have reviewed records and UA's from Dr. Pennie Banter office. The UA's had hematuria without evidence of infection.  The following clinical lab reports were reviewed:  UA reviewed.  Will request old records/history: Urine culture, labs and list of antibiotics given requested from Dr. Pennie Banter office. Selected Results  AU CT-HEMATURIA PROTOCOL [id number] 12:00AM Irine Seal   Test Name Result Flag Reference  AU CT-HEMATURIA PROTOCOL (Report)    ** RADIOLOGY REPORT BY [facility] **   CLINICAL DATA: Microhematuria and gross hematuria, initial encounter.  EXAM: CT ABDOMEN AND PELVIS WITHOUT AND WITH CONTRAST  TECHNIQUE: Multidetector CT imaging of the abdomen and pelvis was performed following the standard protocol before and following the bolus administration of intravenous contrast.  CONTRAST: 125 cc Isovue 300.  COMPARISON:  None.  FINDINGS: Lower chest: Lung bases show no acute findings. Coronary artery calcification. Heart is at the upper limits of normal in size. No pericardial or pleural effusion.  Hepatobiliary: Liver measures 18.6 cm. Liver and gallbladder are otherwise unremarkable. No biliary ductal dilatation.  Pancreas: Negative.  Spleen: Negative.  Adrenals/Urinary Tract: Right adrenal gland is unremarkable. Adreniform thickening on the left. Right kidney is unremarkable. Right ureter is decompressed. Moderate to severe left hydronephrosis secondary to an enhancing mass along the posterior and left lateral wall of the bladder, measuring approximately 3.0 x 4.8 x 6.2 cm. There is symmetric excretion of contrast from the left kidney. Low-attenuation lesions in the left kidney measure up to 7 mm, too small to characterize.  Stomach/Bowel: Stomach, small bowel and colon are unremarkable. Appendix is not readily visualized.  Vascular/Lymphatic: Atherosclerotic calcification of the arterial vasculature without abdominal aortic aneurysm. Circumaortic left renal vein. There is asymmetrically increased vascularity along the distal left ureter and left posterolateral aspect of the bladder. No definite pathologically enlarged lymph nodes.  Reproductive: Calcifications in the uterus likely represent fibroids. A 5.6 cm low-attenuation mass in the right adnexa likely arises from the right ovary. Left ovary is visualized.  Other: No free fluid. Mild omental haziness, nonspecific. No discrete nodularity. Tiny periumbilical hernia contains fat. A 12 mm subcutaneous supraumbilical nodule is likely a sebaceous cyst.  Musculoskeletal: No worrisome lytic or sclerotic lesions.  IMPRESSION: 1. Large bladder mass, most consistent with transitional cell carcinoma, with associated moderate to  severe left hydronephrosis. Normal excretion of contrast from the left kidney. 2. Coronary artery  calcification. 3. Mild hepatomegaly. 4. Cystic right adnexal lesion, likely rising from the right ovary. Baseline pelvic ultrasound may be helpful in further evaluation, as clinically indicated, as a cystic ovarian neoplasm cannot be excluded.   Electronically Signed  By: Lorin Picket M.D.  On: 04/01/2014 12:15    01 Apr 2014 8:46 AM  UA With REFLEX    COLOR YELLOW     APPEARANCE CLEAR     SPECIFIC GRAVITY 1.010     pH 7.0     GLUCOSE NEG     BILIRUBIN NEG     KETONE NEG     BLOOD LARGE     PROTEIN TRACE     UROBILINOGEN 0.2     NITRITE NEG     LEUKOCYTE ESTERASE SMALL     SQUAMOUS EPITHELIAL/HPF NONE SEEN     WBC 3-6     CRYSTALS NONE SEEN     CASTS NONE SEEN     RBC 21-50     BACTERIA RARE     Other OVAL FAT BODIES NOTED  Assessment Assessed  1. Gross hematuria (R31.0) 2. Hydronephrosis, left (N13.30) 3. 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 Cyst of right ovary (N83.20)  Victoria Patrick has a large bladder neoplasm on the left with partial obstruction of the left ureter. this is the source of the hematuria.  She also has a right ovarian cyst and pelvic US was recommended but she has introital stenosis so it would need to be don't abdominally.   Plan  Cyst of right ovary  1. PELVIC U/S; Status:Hold For - Appointment,Date of Service; Requested for:23Mar2016;  Gross hematuria  2. Get Outside Records Office  Follow-up  Status: Hold For - Records,Records Release   Requested for: [id number] 3. AU CT-HEMATURIA PROTOCOL; Status:Resulted - Requires Verification;   Done:  [id number] 12:00AM 4. Pelvic Exam; Status:Complete;   Done: [id number] 5. URINE CYTOLOGY; Status:Canceled - Specimen/Data Collection,Appointment;  6. VENIPUNCTURE; Status:Complete;   Done: [id number] Malignant neoplasm of lateral wall of bladder  7. Follow-up Schedule Surgery Office  Follow-up  Status: Hold For - Appointment   Requested  for: [phone]  Urine culture.  I am going to order the pelvic US.  She will be scheduled for cystoscopy with TURBT with possible [facility] and possible left ureteral stenting.    I reviewed the risks of bleeding, infection, bladder wall injury, chemical cystitis, need for a stent or secondary resections, thrombotic events and anesthetic complications.   Discussion/Summary CC: Dr. Deland Pretty.

## 2014-04-30 ENCOUNTER — Encounter (HOSPITAL_COMMUNITY)
Admission: RE | Disposition: A | Discharge status: Home / Self Care | Payer: Self-pay | Source: Ambulatory Visit | Attending: Urology

## 2014-04-30 ENCOUNTER — Ambulatory Visit (HOSPITAL_COMMUNITY): Payer: Medicare Other | Admitting: Anesthesiology

## 2014-04-30 ENCOUNTER — Observation Stay (HOSPITAL_COMMUNITY)
Admission: RE | Admit: 2014-04-30 | Discharge: 2014-05-02 | Disposition: A | Discharge status: Home / Self Care | Payer: Medicare Other | Source: Ambulatory Visit | Attending: Urology | Admitting: Urology

## 2014-04-30 ENCOUNTER — Encounter (HOSPITAL_COMMUNITY): Payer: Self-pay | Admitting: Anesthesiology

## 2014-04-30 DIAGNOSIS — F419 Anxiety disorder, unspecified: Secondary | ICD-10-CM | POA: Diagnosis not present

## 2014-04-30 DIAGNOSIS — Z79899 Other long term (current) drug therapy: Secondary | ICD-10-CM | POA: Insufficient documentation

## 2014-04-30 DIAGNOSIS — C670 Malignant neoplasm of trigone of bladder: Secondary | ICD-10-CM | POA: Insufficient documentation

## 2014-04-30 DIAGNOSIS — C679 Malignant neoplasm of bladder, unspecified: Secondary | ICD-10-CM

## 2014-04-30 DIAGNOSIS — R634 Abnormal weight loss: Secondary | ICD-10-CM | POA: Insufficient documentation

## 2014-04-30 DIAGNOSIS — M8580 Other specified disorders of bone density and structure, unspecified site: Secondary | ICD-10-CM | POA: Diagnosis not present

## 2014-04-30 DIAGNOSIS — N9971 Accidental puncture and laceration of a genitourinary system organ or structure during a genitourinary system procedure: Secondary | ICD-10-CM | POA: Diagnosis not present

## 2014-04-30 DIAGNOSIS — M549 Dorsalgia, unspecified: Secondary | ICD-10-CM | POA: Diagnosis not present

## 2014-04-30 DIAGNOSIS — Y92234 Operating room of hospital as the place of occurrence of the external cause: Secondary | ICD-10-CM | POA: Diagnosis not present

## 2014-04-30 DIAGNOSIS — G8929 Other chronic pain: Secondary | ICD-10-CM | POA: Insufficient documentation

## 2014-04-30 DIAGNOSIS — Z87891 Personal history of nicotine dependence: Secondary | ICD-10-CM | POA: Insufficient documentation

## 2014-04-30 DIAGNOSIS — I10 Essential (primary) hypertension: Secondary | ICD-10-CM | POA: Diagnosis not present

## 2014-04-30 DIAGNOSIS — N131 Hydronephrosis with ureteral stricture, not elsewhere classified: Secondary | ICD-10-CM | POA: Diagnosis not present

## 2014-04-30 DIAGNOSIS — C672 Malignant neoplasm of lateral wall of bladder: Principal | ICD-10-CM | POA: Insufficient documentation

## 2014-04-30 DIAGNOSIS — Z681 Body mass index (BMI) 19 or less, adult: Secondary | ICD-10-CM | POA: Insufficient documentation

## 2014-04-30 DIAGNOSIS — E039 Hypothyroidism, unspecified: Secondary | ICD-10-CM | POA: Diagnosis not present

## 2014-04-30 DIAGNOSIS — R310 Gross hematuria: Secondary | ICD-10-CM | POA: Diagnosis present

## 2014-04-30 DIAGNOSIS — Y838 Other surgical procedures as the cause of abnormal reaction of the patient, or of later complication, without mention of misadventure at the time of the procedure: Secondary | ICD-10-CM | POA: Diagnosis not present

## 2014-04-30 DIAGNOSIS — C678 Malignant neoplasm of overlapping sites of bladder: Secondary | ICD-10-CM

## 2014-04-30 DIAGNOSIS — D490 Neoplasm of unspecified behavior of digestive system: Secondary | ICD-10-CM | POA: Diagnosis not present

## 2014-04-30 HISTORY — PX: TRANSURETHRAL RESECTION OF BLADDER TUMOR: SHX2575

## 2014-04-30 HISTORY — PX: CYSTOSCOPY: SHX5120

## 2014-04-30 HISTORY — DX: Malignant neoplasm of bladder, unspecified: C67.9

## 2014-04-30 LAB — GLUCOSE, CAPILLARY: GLUCOSE-CAPILLARY: 104 mg/dL — AB (ref 70–99)

## 2014-04-30 SURGERY — CYSTOSCOPY
Anesthesia: General

## 2014-04-30 MED ORDER — EPHEDRINE SULFATE 50 MG/ML IJ SOLN
INTRAMUSCULAR | Status: AC
  Filled 2014-04-30: qty 1

## 2014-04-30 MED ORDER — STERILE WATER FOR IRRIGATION IR SOLN: Status: DC | PRN

## 2014-04-30 MED ORDER — ONDANSETRON HCL 4 MG/2ML IJ SOLN
INTRAMUSCULAR | Status: AC
  Filled 2014-04-30: qty 2

## 2014-04-30 MED ORDER — PHENYLEPHRINE HCL 10 MG/ML IJ SOLN
INTRAMUSCULAR | Status: DC | PRN
  Administered 2014-04-30 (×5): 80 ug via INTRAVENOUS

## 2014-04-30 MED ORDER — PROPOFOL 10 MG/ML IV BOLUS
INTRAVENOUS | Status: DC | PRN
  Administered 2014-04-30: 200 mg via INTRAVENOUS
  Administered 2014-04-30: 50 mg via INTRAVENOUS

## 2014-04-30 MED ORDER — DIPHENHYDRAMINE HCL 50 MG/ML IJ SOLN: 12.5000 mg | Freq: Four times a day (QID) | INTRAMUSCULAR | Status: DC | PRN

## 2014-04-30 MED ORDER — LACTATED RINGERS IV SOLN
INTRAVENOUS | Status: DC | PRN
  Administered 2014-04-30: 09:00:00 via INTRAVENOUS

## 2014-04-30 MED ORDER — FENTANYL CITRATE (PF) 100 MCG/2ML IJ SOLN
INTRAMUSCULAR | Status: AC
  Filled 2014-04-30: qty 2

## 2014-04-30 MED ORDER — CIPROFLOXACIN IN D5W 400 MG/200ML IV SOLN
INTRAVENOUS | Status: AC
  Filled 2014-04-30: qty 200

## 2014-04-30 MED ORDER — PROPOFOL 10 MG/ML IV BOLUS
INTRAVENOUS | Status: AC
  Filled 2014-04-30: qty 20

## 2014-04-30 MED ORDER — PHENYLEPHRINE 40 MCG/ML (10ML) SYRINGE FOR IV PUSH (FOR BLOOD PRESSURE SUPPORT)
PREFILLED_SYRINGE | INTRAVENOUS | Status: AC
  Filled 2014-04-30: qty 10

## 2014-04-30 MED ORDER — PHENAZOPYRIDINE HCL 200 MG PO TABS: 200.0000 mg | ORAL_TABLET | Freq: Three times a day (TID) | ORAL | Status: DC | PRN

## 2014-04-30 MED ORDER — CIPROFLOXACIN IN D5W 400 MG/200ML IV SOLN
400.0000 mg | INTRAVENOUS | Status: AC
  Administered 2014-04-30: 400 mg via INTRAVENOUS

## 2014-04-30 MED ORDER — LIDOCAINE HCL (CARDIAC) 20 MG/ML IV SOLN
INTRAVENOUS | Status: DC | PRN
  Administered 2014-04-30: 50 mg via INTRAVENOUS

## 2014-04-30 MED ORDER — ROCURONIUM BROMIDE 100 MG/10ML IV SOLN
INTRAVENOUS | Status: DC | PRN
  Administered 2014-04-30: 40 mg via INTRAVENOUS

## 2014-04-30 MED ORDER — FENTANYL CITRATE (PF) 100 MCG/2ML IJ SOLN: 25.0000 ug | INTRAMUSCULAR | Status: DC | PRN

## 2014-04-30 MED ORDER — SODIUM CHLORIDE 0.9 % IR SOLN
Status: DC | PRN
  Administered 2014-04-30: 24000 mL via INTRAVESICAL

## 2014-04-30 MED ORDER — LOSARTAN POTASSIUM 50 MG PO TABS
100.0000 mg | ORAL_TABLET | Freq: Every day | ORAL | Status: DC
  Administered 2014-04-30 – 2014-05-02 (×3): 100 mg via ORAL
  Filled 2014-04-30 (×3): qty 2

## 2014-04-30 MED ORDER — ONDANSETRON HCL 4 MG/2ML IJ SOLN
INTRAMUSCULAR | Status: DC | PRN
  Administered 2014-04-30: 4 mg via INTRAVENOUS

## 2014-04-30 MED ORDER — HYDROCODONE-ACETAMINOPHEN 5-325 MG PO TABS: 1.0000 | ORAL_TABLET | ORAL | Status: DC | PRN

## 2014-04-30 MED ORDER — LOSARTAN POTASSIUM-HCTZ 100-12.5 MG PO TABS: 1.0000 | ORAL_TABLET | Freq: Every morning | ORAL | Status: DC

## 2014-04-30 MED ORDER — ZOLPIDEM TARTRATE 5 MG PO TABS: 5.0000 mg | ORAL_TABLET | Freq: Every evening | ORAL | Status: DC | PRN

## 2014-04-30 MED ORDER — HYDROCODONE-ACETAMINOPHEN 5-325 MG PO TABS: 1.0000 | ORAL_TABLET | Freq: Four times a day (QID) | ORAL | Status: DC | PRN

## 2014-04-30 MED ORDER — ACETAMINOPHEN 325 MG PO TABS: 650.0000 mg | ORAL_TABLET | ORAL | Status: DC | PRN

## 2014-04-30 MED ORDER — HYDROMORPHONE HCL 1 MG/ML IJ SOLN: 0.5000 mg | INTRAMUSCULAR | Status: DC | PRN

## 2014-04-30 MED ORDER — LIDOCAINE HCL (CARDIAC) 20 MG/ML IV SOLN
INTRAVENOUS | Status: AC
  Filled 2014-04-30: qty 5

## 2014-04-30 MED ORDER — DIPHENHYDRAMINE HCL 12.5 MG/5ML PO ELIX: 12.5000 mg | ORAL_SOLUTION | Freq: Four times a day (QID) | ORAL | Status: DC | PRN

## 2014-04-30 MED ORDER — SODIUM CHLORIDE 0.9 % IJ SOLN
INTRAMUSCULAR | Status: AC
  Filled 2014-04-30: qty 10

## 2014-04-30 MED ORDER — ONDANSETRON HCL 4 MG/2ML IJ SOLN: 4.0000 mg | INTRAMUSCULAR | Status: DC | PRN

## 2014-04-30 MED ORDER — GLYCOPYRROLATE 0.2 MG/ML IJ SOLN
INTRAMUSCULAR | Status: DC | PRN
  Administered 2014-04-30: 0.4 mg via INTRAVENOUS

## 2014-04-30 MED ORDER — FENTANYL CITRATE (PF) 100 MCG/2ML IJ SOLN
INTRAMUSCULAR | Status: DC | PRN
  Administered 2014-04-30: 25 ug via INTRAVENOUS
  Administered 2014-04-30: 50 ug via INTRAVENOUS
  Administered 2014-04-30: 25 ug via INTRAVENOUS

## 2014-04-30 MED ORDER — CIPROFLOXACIN HCL 250 MG PO TABS
250.0000 mg | ORAL_TABLET | Freq: Two times a day (BID) | ORAL | Status: DC
  Administered 2014-04-30 – 2014-05-02 (×4): 250 mg via ORAL
  Filled 2014-04-30 (×4): qty 1

## 2014-04-30 MED ORDER — HYDROCHLOROTHIAZIDE 12.5 MG PO CAPS
12.5000 mg | ORAL_CAPSULE | Freq: Every day | ORAL | Status: DC
  Administered 2014-04-30 – 2014-05-02 (×3): 12.5 mg via ORAL
  Filled 2014-04-30 (×3): qty 1

## 2014-04-30 MED ORDER — KCL IN DEXTROSE-NACL 20-5-0.45 MEQ/L-%-% IV SOLN
INTRAVENOUS | Status: DC
  Administered 2014-04-30 – 2014-05-01 (×3): via INTRAVENOUS
  Filled 2014-04-30 (×5): qty 1000

## 2014-04-30 MED ORDER — DOCUSATE SODIUM 100 MG PO CAPS
100.0000 mg | ORAL_CAPSULE | Freq: Two times a day (BID) | ORAL | Status: DC
  Administered 2014-04-30 – 2014-05-02 (×4): 100 mg via ORAL
  Filled 2014-04-30 (×4): qty 1

## 2014-04-30 MED ORDER — NEOSTIGMINE METHYLSULFATE 10 MG/10ML IV SOLN
INTRAVENOUS | Status: DC | PRN
  Administered 2014-04-30: 2 mg via INTRAVENOUS

## 2014-04-30 MED ORDER — LEVOTHYROXINE SODIUM 88 MCG PO TABS
88.0000 ug | ORAL_TABLET | Freq: Every day | ORAL | Status: DC
  Administered 2014-05-01 – 2014-05-02 (×2): 88 ug via ORAL
  Filled 2014-04-30 (×3): qty 1

## 2014-04-30 MED ORDER — MITOMYCIN CHEMO FOR BLADDER INSTILLATION 40 MG
40.0000 mg | Freq: Once | INTRAVENOUS | Status: DC
  Filled 2014-04-30: qty 40

## 2014-04-30 MED ORDER — BISACODYL 10 MG RE SUPP
10.0000 mg | Freq: Every day | RECTAL | Status: DC | PRN
  Administered 2014-05-02: 10 mg via RECTAL
  Filled 2014-04-30: qty 1

## 2014-04-30 MED ORDER — HYOSCYAMINE SULFATE 0.125 MG SL SUBL
0.1250 mg | SUBLINGUAL_TABLET | SUBLINGUAL | Status: DC | PRN
  Filled 2014-04-30: qty 1

## 2014-04-30 MED ORDER — GLYCOPYRROLATE 0.2 MG/ML IJ SOLN
INTRAMUSCULAR | Status: AC
  Filled 2014-04-30: qty 1

## 2014-04-30 MED ORDER — ONDANSETRON HCL 4 MG PO TABS: 4.0000 mg | ORAL_TABLET | Freq: Three times a day (TID) | ORAL | Status: DC | PRN

## 2014-04-30 MED ORDER — ROCURONIUM BROMIDE 100 MG/10ML IV SOLN
INTRAVENOUS | Status: AC
  Filled 2014-04-30: qty 1

## 2014-04-30 MED ORDER — ONDANSETRON HCL 4 MG/2ML IJ SOLN: 4.0000 mg | Freq: Once | INTRAMUSCULAR | Status: DC | PRN

## 2014-04-30 SURGICAL SUPPLY — 20 items
BAG URINE DRAINAGE (UROLOGICAL SUPPLIES) ×3 IMPLANT
BAG URO CATCHER STRL LF (DRAPE) ×4 IMPLANT
CATH FOLEY 3WAY 30CC 22FR (CATHETERS) IMPLANT
CATH HEMA 3WAY 30CC 24FR COUDE (CATHETERS) ×3 IMPLANT
CATH ROBINSON RED A/P 16FR (CATHETERS) IMPLANT
CATH URET 5FR 28IN OPEN ENDED (CATHETERS) ×3 IMPLANT
CLOTH BEACON ORANGE TIMEOUT ST (SAFETY) ×4 IMPLANT
GLOVE SURG SS PI 8.0 STRL IVOR (GLOVE) ×3 IMPLANT
GOWN STRL REUS W/TWL XL LVL3 (GOWN DISPOSABLE) ×4 IMPLANT
HOLDER FOLEY CATH W/STRAP (MISCELLANEOUS) IMPLANT
KIT ASPIRATION TUBING (SET/KITS/TRAYS/PACK) ×1 IMPLANT
LOOP CUT BIPOLAR 24F LRG (ELECTROSURGICAL) ×3 IMPLANT
MANIFOLD NEPTUNE II (INSTRUMENTS) ×4 IMPLANT
PACK CYSTO (CUSTOM PROCEDURE TRAY) ×4 IMPLANT
PLUG CATH AND CAP STER (CATHETERS) ×3 IMPLANT
SUT ETHILON 3 0 PS 1 (SUTURE) IMPLANT
SYR 30ML LL (SYRINGE) IMPLANT
SYRINGE IRR TOOMEY STRL 70CC (SYRINGE) ×3 IMPLANT
TUBING CONNECTING 10 (TUBING) ×3 IMPLANT
TUBING CONNECTING 10' (TUBING) ×1

## 2014-04-30 NOTE — Discharge Instructions (Signed)

## 2014-04-30 NOTE — Brief Op Note (Signed)
04/30/2014  11:40 AM  PATIENT:  Josem Kaufmann  81 y.o. female  PRE-OPERATIVE DIAGNOSIS:  BLADDER TUMOR >5cm  POST-OPERATIVE DIAGNOSIS:  BLADDER TUMOR >5cm, LEFT URETERAL TUMOR  PROCEDURE:  Procedure(s): CYSTOSCOPY (N/A) TRANSURETHRAL RESECTION OF BLADDER TUMOR (TURBT) (N/A) >5cm  SURGEON:  Surgeon(s) and Role:    * Irine Seal, MD - Primary  PHYSICIAN ASSISTANT:   ASSISTANTS: none   ANESTHESIA:   general  EBL:  Total I/O In: 800 [I.V.:800] Out: -   BLOOD ADMINISTERED:none  DRAINS: Urinary Catheter (Foley)   LOCAL MEDICATIONS USED:  NONE  SPECIMEN:  Source of Specimen:  bladder tumor  DISPOSITION OF SPECIMEN:  PATHOLOGY  COUNTS:  YES  TOURNIQUET:  * No tourniquets in log *  DICTATION: .Other Dictation: Dictation Number [id number]  PLAN OF CARE: Admit for overnight observation  PATIENT DISPOSITION:  PACU - hemodynamically stable.   Delay start of Pharmacological VTE agent (>24hrs) due to surgical blood loss or risk of bleeding: yes

## 2014-04-30 NOTE — Transfer of Care (Signed)
Immediate Anesthesia Transfer of Care Note  Patient: Victoria Patrick  Procedure(s) Performed: Procedure(s): CYSTOSCOPY (N/A) TRANSURETHRAL RESECTION OF BLADDER TUMOR (TURBT) (N/A)  Patient Location: PACU  Anesthesia Type:General  Level of Consciousness:  sedated, patient cooperative and responds to stimulation  Airway & Oxygen Therapy:Patient Spontanous Breathing and Patient connected to face mask oxgen  Post-op Assessment:  Report given to PACU RN and Post -op Vital signs reviewed and stable  Post vital signs:  Reviewed and stable  Last Vitals:  Filed Vitals:   04/30/14 0817  BP: 147/80  Pulse: 97  Temp: 36.6 C  Resp: 20    Complications: No apparent anesthesia complications

## 2014-04-30 NOTE — Anesthesia Procedure Notes (Signed)
Procedure Name: Intubation Date/Time: 04/30/2014 10:16 AM Performed by: Hasson Gaspard, Virgel Gess Pre-anesthesia Checklist: Patient identified, Emergency Drugs available, Suction available, Patient being monitored and Timeout performed Patient Re-evaluated:Patient Re-evaluated prior to inductionOxygen Delivery Method: Circle system utilized Preoxygenation: Pre-oxygenation with 100% oxygen Intubation Type: IV induction Ventilation: Mask ventilation without difficulty Laryngoscope Size: Mac and 3 Grade View: Grade II Tube type: Oral Tube size: 7.5 mm Number of attempts: 1 Airway Equipment and Method: Stylet Placement Confirmation: ETT inserted through vocal cords under direct vision,  positive ETCO2,  CO2 detector and breath sounds checked- equal and bilateral Secured at: 22 cm Tube secured with: Tape Dental Injury: Teeth and Oropharynx as per pre-operative assessment

## 2014-04-30 NOTE — Interval H&P Note (Signed)
History and Physical Interval Note:  04/30/2014 10:03 AM  Josem Kaufmann  has presented today for surgery, with the diagnosis of BLADDER TUMOR  The various methods of treatment have been discussed with the patient and family. After consideration of risks, benefits and other options for treatment, the patient has consented to  Procedure(s): CYSTOSCOPY (N/A) TRANSURETHRAL RESECTION OF BLADDER TUMOR (TURBT) (N/A) CYSTOSCOPY WITH POSSIBLE LEFT URETERAL STENT   (Left) as a surgical intervention .  The patient's history has been reviewed, patient examined, no change in status, stable for surgery.  I have reviewed the patient's chart and labs.  Questions were answered to the patient's satisfaction.     Jericha Bryden J

## 2014-04-30 NOTE — Anesthesia Postprocedure Evaluation (Signed)
  Anesthesia Post-op Note  Patient: Victoria Patrick  Procedure(s) Performed: Procedure(s) (LRB): CYSTOSCOPY (N/A) TRANSURETHRAL RESECTION OF BLADDER TUMOR (TURBT) (N/A)  Patient Location: PACU  Anesthesia Type: General  Level of Consciousness: awake and alert   Airway and Oxygen Therapy: Patient Spontanous Breathing  Post-op Pain: mild  Post-op Assessment: Post-op Vital signs reviewed, Patient's Cardiovascular Status Stable, Respiratory Function Stable, Patent Airway and No signs of Nausea or vomiting  Last Vitals:  Filed Vitals:   04/30/14 1300  BP: 147/63  Pulse: 67  Temp: 36.3 C  Resp:     Post-op Vital Signs: stable   Complications: No apparent anesthesia complications

## 2014-04-30 NOTE — Op Note (Signed)
NAMEMarland Patrick  BURNA, ATLAS NO.:  [id number]  MEDICAL RECORD NO.:  [id number]  LOCATION:  4259                         FACILITY:  [facility]  PHYSICIAN:  Marshall Cork. Jeffie Pollock, M.D.    DATE OF BIRTH:  Oct 19, 1922  DATE OF PROCEDURE:  04/30/2014 DATE OF DISCHARGE:                              OPERATIVE REPORT   PROCEDURE:  Cystoscopy and transurethral resection of large bladder tumor.  PREOPERATIVE DIAGNOSIS:  Large left bladder wall, bladder tumor, ureteral obstruction.  POSTOPERATIVE DIAGNOSIS:  Large left bladder wall, bladder tumor, ureteral obstruction, with tumor in the left distal ureter.  SURGEON:  Marshall Cork. Jeffie Pollock, M.D.  ANESTHESIA:  General.  COMPLICATION:  Extraperitoneal bladder perforation.  DRAINS:  A 24-French hematuria Foley.  ESTIMATED BLOOD LOSS:  Approximately 100 mL.  INDICATIONS:  [name] is an 81 year old white female who was referred for gross hematuria.  She was found on CT scan to have a bladder tumor at the left lateral wall with left ureteral obstruction.  It was felt that TURBT, possible stent, possible mitomycin C were indicated.  FINDINGS OF PROCEDURE:  She was taken to the operating room.  A general anesthetic was induced.  She was given Cipro.  She was fitted with PAS hose and was placed in lithotomy position.  Her perineum and genitalia were prepped with Betadine solution.  She was draped in usual sterile fashion.  Cystoscopy was performed using the 23-French scope and 30-degree lens. Examination revealed a normal distal urethra, but at the bladder neck, there were circumferential papillary fronds.  Inspection of the bladder revealed a large tumor that extended from the right of midline and the trigone out to the anterior bladder wall well on to the posterior wall. The tumor was well in excess of 5 cm.  The left ureteral orifice could not be identified.  The right ureteral orifice was also not visualized.  Once cystoscopy had been  performed, a 28-French continuous flow resectoscope sheath was inserted.  This was fitted with an Beatrix Fetters handle with 30-degree lens and bipolar loop saline was used for the irrigant.  The resection was initiated in the base of the bladder posteriorly and carried out to the bladder neck and up laterally.  There was extensive tumor.  I was able to clean off the floor of the bladder and the inferior portion of the left lateral wall, and I did encounter the muscularis which was quite thin.  During my resection, I did unroof what appeared to be the ureteral meatus with tumor fronds growing up out of the ureteral meatus, consistent with distal ureteral involvement of the cancer.  The patient had been given a paralytic agent as part of her anesthesia, but despite this, while resecting the left anterior portion of the tumor, she experienced an obturator reflex and a small extraperitoneal perforation was noted.  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  At this point, I evacuated the bladder free of chips and clots and achieved good hemostasis.  Frequent palpation of the anterior abdominal wall suggested no significant extravasation into the perivesical space or into the peritoneal cavity, but I still felt that it would not be wise to continue resection at this point.  So, once adequate hemostasis was achieved, an inspection revealed no retained tumor, tissue, clots, or active bleeding.  The scope was removed and a 24-French 3-way hematuria catheter was inserted.  The balloon was filled with 20 mL of sterile fluid.  The irrigation port was plugged and the catheter was irrigated by hand with clear return and placed to  straight drainage.  The patient was then taken down from lithotomy position.  Her anesthetic was reversed and she was moved to recovery room in stable condition.  Her procedure was complicated by the small extraperitoneal perforation from the obturator reflex.  It is very likely that she will need cystectomy for adequate treatment of her extensive bladder cancer.     Marshall Cork. Jeffie Pollock, M.D.     JJW/MEDQ  D:  04/30/2014  T:  04/30/2014  Job:  [id number]

## 2014-04-30 NOTE — Anesthesia Preprocedure Evaluation (Addendum)
Anesthesia Evaluation  Patient identified by MRN, date of birth, ID band Patient awake    Reviewed: Allergy & Precautions, NPO status , Patient's Chart, lab work & pertinent test results  History of Anesthesia Complications Negative for: history of anesthetic complications  Airway Mallampati: II  TM Distance: >3 FB Neck ROM: Full    Dental no notable dental hx. (+) Dental Advisory Given   Pulmonary former smoker,  breath sounds clear to auscultation  Pulmonary exam normal       Cardiovascular hypertension, Pt. on medications + dysrhythmias Rhythm:Regular Rate:Normal     Neuro/Psych PSYCHIATRIC DISORDERS Anxiety negative neurological ROS     GI/Hepatic negative GI ROS, Neg liver ROS,   Endo/Other  diabetesHypothyroidism   Renal/GU negative Renal ROS  negative genitourinary   Musculoskeletal negative musculoskeletal ROS (+)   Abdominal   Peds negative pediatric ROS (+)  Hematology negative hematology ROS (+)   Anesthesia Other Findings   Reproductive/Obstetrics negative OB ROS                            Anesthesia Physical Anesthesia Plan  ASA: II  Anesthesia Plan: General   Post-op Pain Management:    Induction: Intravenous  Airway Management Planned: LMA  Additional Equipment:   Intra-op Plan:   Post-operative Plan: Extubation in OR  Informed Consent: I have reviewed the patients History and Physical, chart, labs and discussed the procedure including the risks, benefits and alternatives for the proposed anesthesia with the patient or authorized representative who has indicated his/her understanding and acceptance.   Dental advisory given  Plan Discussed with: CRNA  Anesthesia Plan Comments:         Anesthesia Quick Evaluation

## 2014-04-30 NOTE — Progress Notes (Signed)
Patient ID: Victoria Patrick, female   DOB: 10-Oct-1922, 81 y.o.   MRN: [id number] Day of Surgery  Subjective: Victoria Patrick is doing well postop.  She has some suprapubic aching but no real pain.   The foley is draining well.  The urine is pink.   She had very extensive bladder cancer and it appeared to go up the left ureter.  I was unable to completely resect the tumor because of the extent of disease and also because of the small perforation from an obturator reflex.  ROS:  ROS  Anti-infectives: Anti-infectives    Start     Dose/Rate Route Frequency Ordered Stop   04/30/14 2000  ciprofloxacin (CIPRO) tablet 250 mg     250 mg Oral 2 times daily 04/30/14 1310     04/30/14 0816  ciprofloxacin (CIPRO) IVPB 400 mg     400 mg 200 mL/hr over 60 Minutes Intravenous 60 min pre-op 04/30/14 0816 04/30/14 1020      Current Facility-Administered Medications  Medication Dose Route Frequency Provider Last Rate Last Dose  . acetaminophen (TYLENOL) tablet 650 mg  650 mg Oral Q4H PRN Irine Seal, MD      . bisacodyl (DULCOLAX) suppository 10 mg  10 mg Rectal Daily PRN Irine Seal, MD      . ciprofloxacin (CIPRO) tablet 250 mg  250 mg Oral BID Irine Seal, MD      . dextrose 5 % and 0.45 % NaCl with KCl 20 mEq/L infusion   Intravenous Continuous Irine Seal, MD 75 mL/hr at 04/30/14 1409    . diphenhydrAMINE (BENADRYL) injection 12.5 mg  12.5 mg Intravenous Q6H PRN Irine Seal, MD       Or  . diphenhydrAMINE (BENADRYL) 12.5 MG/5ML elixir 12.5 mg  12.5 mg Oral Q6H PRN Irine Seal, MD      . docusate sodium (COLACE) capsule 100 mg  100 mg Oral BID Irine Seal, MD      . losartan (COZAAR) tablet 100 mg  100 mg Oral Daily Irine Seal, MD   100 mg at 04/30/14 1410   And  . hydrochlorothiazide (MICROZIDE) capsule 12.5 mg  12.5 mg Oral Daily Irine Seal, MD   12.5 mg at 04/30/14 1410  . HYDROcodone-acetaminophen (NORCO/VICODIN) 5-325 MG per tablet 1-2 tablet  1-2 tablet Oral Q4H PRN Irine Seal, MD      . HYDROmorphone (DILAUDID)  injection 0.5-1 mg  0.5-1 mg Intravenous Q2H PRN Irine Seal, MD      . hyoscyamine (LEVSIN SL) SL tablet 0.125 mg  0.125 mg Sublingual Q4H PRN Irine Seal, MD      . Derrill Memo ON 05/01/2014] levothyroxine (SYNTHROID, LEVOTHROID) tablet 88 mcg  88 mcg Oral QAC breakfast Irine Seal, MD      . ondansetron [facility]) injection 4 mg  4 mg Intravenous Q4H PRN Irine Seal, MD      . zolpidem (AMBIEN) tablet 5 mg  5 mg Oral QHS PRN Irine Seal, MD         Objective: Vital signs in last 24 hours: Temp:  [97.3 F (36.3 C)-97.8 F (36.6 C)] 97.3 F (36.3 C) (04/21 1300) Pulse Rate:  [65-98] 67 (04/21 1300) Resp:  [13-20] 13 (04/21 1245) BP: (109-147)/(63-83) 147/63 mmHg (04/21 1300) SpO2:  [99 %-100 %] 100 % (04/21 1300) Weight:  [55.8 kg (123 lb 0.3 oz)-60.328 kg (133 lb)] 55.8 kg (123 lb 0.3 oz) (04/21 1300)  Intake/Output from previous day:   Intake/Output this shift: Total I/O In: 900 [I.V.:900] Out:  850 [Urine:800; Blood:50]   Physical Exam  Constitutional: She is well-developed, well-nourished, and in no distress.  Vitals reviewed.   Lab Results:  No results for input(s): WBC, HGB, HCT, PLT in the last 72 hours. BMET No results for input(s): NA, K, CL, CO2, GLUCOSE, BUN, CREATININE, CALCIUM in the last 72 hours. PT/INR No results for input(s): LABPROT, INR in the last 72 hours. ABG No results for input(s): PHART, HCO3 in the last 72 hours.  Invalid input(s): PCO2, PO2  Studies/Results: No results found.   Assessment: s/p Procedure(s): CYSTOSCOPY TRANSURETHRAL RESECTION OF BLADDER TUMOR (TURBT)  She is doing well post op but has extensive bladder cancer.   Plan: Continue foley drainage. She will probable need cystectomy with at least a partial left distal ureterectomy for treatment of her cancer.         Victoria Patrick J 04/30/2014

## 2014-05-01 ENCOUNTER — Encounter (HOSPITAL_COMMUNITY): Payer: Self-pay | Admitting: Urology

## 2014-05-01 DIAGNOSIS — M549 Dorsalgia, unspecified: Secondary | ICD-10-CM | POA: Diagnosis not present

## 2014-05-01 DIAGNOSIS — C678 Malignant neoplasm of overlapping sites of bladder: Secondary | ICD-10-CM | POA: Diagnosis not present

## 2014-05-01 DIAGNOSIS — E039 Hypothyroidism, unspecified: Secondary | ICD-10-CM | POA: Diagnosis not present

## 2014-05-01 DIAGNOSIS — G8929 Other chronic pain: Secondary | ICD-10-CM | POA: Diagnosis not present

## 2014-05-01 DIAGNOSIS — I10 Essential (primary) hypertension: Secondary | ICD-10-CM | POA: Diagnosis not present

## 2014-05-01 DIAGNOSIS — F419 Anxiety disorder, unspecified: Secondary | ICD-10-CM | POA: Diagnosis not present

## 2014-05-01 DIAGNOSIS — C672 Malignant neoplasm of lateral wall of bladder: Secondary | ICD-10-CM | POA: Diagnosis not present

## 2014-05-01 LAB — BASIC METABOLIC PANEL
Anion gap: 5 (ref 5–15)
BUN: 9 mg/dL (ref 6–23)
CO2: 31 mmol/L (ref 19–32)
Calcium: 9 mg/dL (ref 8.4–10.5)
Chloride: 96 mmol/L (ref 96–112)
Creatinine, Ser: 0.93 mg/dL (ref 0.50–1.10)
GFR calc Af Amer: 61 mL/min — ABNORMAL LOW (ref >90)
GFR calc non Af Amer: 53 mL/min — ABNORMAL LOW (ref >90)
GLUCOSE: 117 mg/dL — AB (ref 70–99)
POTASSIUM: 4.2 mmol/L (ref 3.5–5.1)
Sodium: 132 mmol/L — ABNORMAL LOW (ref 135–145)

## 2014-05-01 LAB — CBC
HCT: 34.3 % — ABNORMAL LOW (ref 36.0–46.0)
Hemoglobin: 11.7 g/dL — ABNORMAL LOW (ref 12.0–15.0)
MCH: 31 pg (ref 26.0–34.0)
MCHC: 34.1 g/dL (ref 30.0–36.0)
MCV: 90.7 fL (ref 78.0–100.0)
Platelets: UNDETERMINED 10*3/uL (ref 150–400)
RBC: 3.78 MIL/uL — ABNORMAL LOW (ref 3.87–5.11)
RDW: 12.4 % (ref 11.5–15.5)
WBC: 6.8 10*3/uL (ref 4.0–10.5)

## 2014-05-01 NOTE — Progress Notes (Signed)
UR completed 

## 2014-05-01 NOTE — Progress Notes (Signed)
Patient ID: Victoria Patrick, female   DOB: 05-May-1922, 82 y.o.   MRN: [id number] 1 Day Post-Op  Subjective: Victoria Patrick is doing well this morning with reduced pain but her urine is still somewhat bloody. ROS:  Review of Systems  Constitutional: Negative for fever.  Gastrointestinal: Negative for nausea.    Anti-infectives: Anti-infectives    Start     Dose/Rate Route Frequency Ordered Stop   04/30/14 2000  ciprofloxacin (CIPRO) tablet 250 mg     250 mg Oral 2 times daily 04/30/14 1310     04/30/14 0816  ciprofloxacin (CIPRO) IVPB 400 mg     400 mg 200 mL/hr over 60 Minutes Intravenous 60 min pre-op 04/30/14 0816 04/30/14 1020      Current Facility-Administered Medications  Medication Dose Route Frequency Provider Last Rate Last Dose  . acetaminophen (TYLENOL) tablet 650 mg  650 mg Oral Q4H PRN Irine Seal, MD      . bisacodyl (DULCOLAX) suppository 10 mg  10 mg Rectal Daily PRN Irine Seal, MD      . ciprofloxacin (CIPRO) tablet 250 mg  250 mg Oral BID Irine Seal, MD   250 mg at 04/30/14 2022  . dextrose 5 % and 0.45 % NaCl with KCl 20 mEq/L infusion   Intravenous Continuous Irine Seal, MD 75 mL/hr at 05/01/14 0234    . diphenhydrAMINE (BENADRYL) injection 12.5 mg  12.5 mg Intravenous Q6H PRN Irine Seal, MD       Or  . diphenhydrAMINE (BENADRYL) 12.5 MG/5ML elixir 12.5 mg  12.5 mg Oral Q6H PRN Irine Seal, MD      . docusate sodium (COLACE) capsule 100 mg  100 mg Oral BID Irine Seal, MD   100 mg at 04/30/14 2022  . losartan (COZAAR) tablet 100 mg  100 mg Oral Daily Irine Seal, MD   100 mg at 04/30/14 1410   And  . hydrochlorothiazide (MICROZIDE) capsule 12.5 mg  12.5 mg Oral Daily Irine Seal, MD   12.5 mg at 04/30/14 1410  . HYDROcodone-acetaminophen (NORCO/VICODIN) 5-325 MG per tablet 1-2 tablet  1-2 tablet Oral Q4H PRN Irine Seal, MD      . HYDROmorphone (DILAUDID) injection 0.5-1 mg  0.5-1 mg Intravenous Q2H PRN Irine Seal, MD      . hyoscyamine (LEVSIN SL) SL tablet 0.125 mg  0.125 mg  Sublingual Q4H PRN Irine Seal, MD      . levothyroxine (SYNTHROID, LEVOTHROID) tablet 88 mcg  88 mcg Oral QAC breakfast Irine Seal, MD      . ondansetron [facility]) injection 4 mg  4 mg Intravenous Q4H PRN Irine Seal, MD      . zolpidem (AMBIEN) tablet 5 mg  5 mg Oral QHS PRN Irine Seal, MD         Objective: Vital signs in last 24 hours: Temp:  [97.3 F (36.3 C)-98.7 F (37.1 C)] 98.4 F (36.9 C) (04/22 0456) Pulse Rate:  [65-98] 72 (04/22 0456) Resp:  [13-20] 16 (04/22 0456) BP: (109-147)/(48-83) 116/52 mmHg (04/22 0456) SpO2:  [96 %-100 %] 96 % (04/22 0456) Weight:  [55.8 kg (123 lb 0.3 oz)-60.328 kg (133 lb)] 55.8 kg (123 lb 0.3 oz) (04/21 1300)  Intake/Output from previous day: 04/21 0701 - 04/22 0700 In: 2568.8 [P.O.:480; I.V.:2088.8] Out: 2250 [Urine:2200; Blood:50] Intake/Output this shift:     Physical Exam  Constitutional: She is well-developed, well-nourished, and in no distress.  Cardiovascular: Normal rate and regular rhythm.   Pulmonary/Chest: Effort normal and breath sounds normal.  Abdominal: Soft. There is tenderness (suprapubic).  Vitals reviewed.   Lab Results:   Recent Labs  05/01/14 0522  WBC 6.8  HGB 11.7*  HCT 34.3*  PLT PLATELET CLUMPS NOTED ON SMEAR, UNABLE TO ESTIMATE   BMET  Recent Labs  05/01/14 0522  NA 132*  K 4.2  CL 96  CO2 31  GLUCOSE 117*  BUN 9  CREATININE 0.93  CALCIUM 9.0   PT/INR No results for input(s): LABPROT, INR in the last 72 hours. ABG No results for input(s): PHART, HCO3 in the last 72 hours.  Invalid input(s): PCO2, PO2  Studies/Results: No results found.  Labs reviewed.  Path pending  Assessment: s/p Procedure(s): CYSTOSCOPY TRANSURETHRAL RESECTION OF BLADDER TUMOR (TURBT)  She had an extensive resection with a small extraperitoneal perforation and was left with residual tumor because of the bulk of the disease. She appeared to have involvement of the left distal ureter with tumor.  She is  doing well but the urine is still somewhat bloody.  Plan: I am going to keep her one for day with the foley to give the small perf time to seal.  I have ordered the foley out in am.   I will have her see Dr. Tresa Moore post discharge for consideration of cystectomy with left distal ureterectomy.        Victoria Patrick J 05/01/2014

## 2014-05-01 NOTE — Progress Notes (Signed)
INITIAL NUTRITION ASSESSMENT  Pt meets criteria for NON-SEVERE (moderate) MALNUTRITION in the context of chronic illness, cancer as evidenced by mild/moderate muscle and fat wasting and 35 lb weight loss (22% body weight) in 6 months.  DOCUMENTATION CODES Per approved criteria  -Non-severe malnutrition in the context of chronic illness   INTERVENTION: - Continue current diet - RD to monitor for needs  NUTRITION DIAGNOSIS: Increased protein-energy needs related to biological demands, catabolic disease as evidenced by bladder cancer s/p resection.   Goal: Pt to meet >/=90% estimated needs  Monitor:  Meal intakes, need for supplement, weight trends, labs  Reason for Assessment: Malnutrition Screening Tool asssessment  81 y.o. female  Admitting Dx: Bladder cancer  ASSESSMENT: This is a 81 year old with hx of extensive bladder cancer who underwent resection of tumor 4/21.  Pt seen for MST with BMI WDL. No intakes documented since admission. Pt reports very good appetite now and PTA. She denies chewing/swallowing issues. She states that PTA she cut back on which foods she would eat in an attempt to lose weight. She states that she had been told she had borderline DM so she cut back on desserts and carbohydrate-dense foods. Pt reports UBW of 158 lbs and that she last weighed this about 6 months ago. This indicates 35 lb weight loss (22% body weight) in this time frame. Pt does not drink nutrition supplements at home and feels that her appetite has been "too good" here. Will monitor for need/acceptance of supplement.  Unable to assess if pt is meeting needs at this time. Labs reviewed; Na: 132 mmol/L, GFR: 53.  Nutrition Focused Physical Exam:  Subcutaneous Fat:  Orbital Region: WDL Upper Arm Region: mild/moderate depletion Thoracic and Lumbar Region: did not assess at this time  Muscle:  Temple Region: mild/moderate depletion Clavicle Bone Region: mild/moderate depletion Clavicle  and Acromion Bone Region: mild/moderate depletion Scapular Bone Region: WDL Dorsal Hand: WDL Patellar Region: WDL Anterior Thigh Region: did not assess at this time Posterior Calf Region: mild/moderate depletion  Edema: none present    Height: Ht Readings from Last 1 Encounters:  04/30/14 5\' 7"  (1.702 m)    Weight: Wt Readings from Last 1 Encounters:  04/30/14 123 lb 0.3 oz (55.8 kg)    Ideal Body Weight: 135 lbs (61.36 kg)  % Ideal Body Weight: 91%  Wt Readings from Last 10 Encounters:  04/30/14 123 lb 0.3 oz (55.8 kg)  04/09/13 152 lb (68.947 kg)    Usual Body Weight: 158 lbs (71.82 kg)  % Usual Body Weight: 78%  BMI:  Body mass index is 19.26 kg/(m^2).  Estimated Nutritional Needs: Kcal: 1600-1800 Protein: 65-85 grams Fluid: 2L/day  Skin: abdominal surgical wound likely but documented as WDL  Diet Order: Diet regular Room service appropriate?: Yes; Fluid consistency:: Thin  EDUCATION NEEDS: -No education needs identified at this time   Intake/Output Summary (Last 24 hours) at 05/01/14 1509 Last data filed at 05/01/14 0600  Gross per 24 hour  Intake 1668.75 ml  Output   2200 ml  Net -531.25 ml    Last BM: PTA   Labs:   Recent Labs Lab 05/01/14 0522  NA 132*  K 4.2  CL 96  CO2 31  BUN 9  CREATININE 0.93  CALCIUM 9.0  GLUCOSE 117*    CBG (last 3)   Recent Labs  04/30/14 0819  GLUCAP 104*    Scheduled Meds: . ciprofloxacin  250 mg Oral BID  . docusate sodium  100  mg Oral BID  . losartan  100 mg Oral Daily   And  . hydrochlorothiazide  12.5 mg Oral Daily  . levothyroxine  88 mcg Oral QAC breakfast    Continuous Infusions: . dextrose 5 % and 0.45 % NaCl with KCl 20 mEq/L 75 mL/hr at 05/01/14 0234    Past Medical History  Diagnosis Date  . Hx of bronchitis     age 21   . Boils     hx of  years ago   . Hypertension   . Dysrhythmia     occasional skip   . Hypothyroidism   . Diabetes mellitus without complication      borderline on no meds   . Anxiety   . Cancer     hx of skin cancer on face     Past Surgical History  Procedure Laterality Date  . Hx of skin cancer removal      locally done   . Cystoscopy N/A 04/30/2014    Procedure: CYSTOSCOPY;  Surgeon: Irine Seal, MD;  Location: [facility];  Service: Urology;  Laterality: N/A;  . Transurethral resection of bladder tumor N/A 04/30/2014    Procedure: TRANSURETHRAL RESECTION OF BLADDER TUMOR (TURBT);  Surgeon: Irine Seal, MD;  Location: [facility];  Service: Urology;  Laterality: N/A;    Jarome Matin, RD, LDN Inpatient Clinical Dietitian Pager # [phone] After hours/weekend pager # [phone]

## 2014-05-02 DIAGNOSIS — C672 Malignant neoplasm of lateral wall of bladder: Secondary | ICD-10-CM | POA: Diagnosis not present

## 2014-05-02 DIAGNOSIS — M549 Dorsalgia, unspecified: Secondary | ICD-10-CM | POA: Diagnosis not present

## 2014-05-02 DIAGNOSIS — I10 Essential (primary) hypertension: Secondary | ICD-10-CM | POA: Diagnosis not present

## 2014-05-02 DIAGNOSIS — E039 Hypothyroidism, unspecified: Secondary | ICD-10-CM | POA: Diagnosis not present

## 2014-05-02 DIAGNOSIS — F419 Anxiety disorder, unspecified: Secondary | ICD-10-CM | POA: Diagnosis not present

## 2014-05-02 DIAGNOSIS — G8929 Other chronic pain: Secondary | ICD-10-CM | POA: Diagnosis not present

## 2014-05-02 NOTE — Progress Notes (Signed)
Reviewed discharge instructions with patient and family present. Patient verbalizes understading of reasons to call MD and prescriptions.

## 2014-05-02 NOTE — Progress Notes (Signed)
Utilization Review completed.  

## 2014-05-02 NOTE — Discharge Summary (Signed)
Patient ID: Victoria Patrick MRN: [id number] DOB/AGE: 1922/09/04 81 y.o.  Admit date: 04/30/2014 Discharge date: 05/02/2014  Primary Care Physician:  Horatio Pel, MD  Discharge Diagnoses:   Present on Admission:  . Bladder cancer  Consults:  None   Discharge Medications:   Medication List    TAKE these medications        calcium-vitamin D 500-200 MG-UNIT per tablet  Commonly known as:  OSCAL WITH D  Take 1 tablet by mouth every morning.     Fish Oil 1000 MG Caps  Take 2 capsules by mouth every morning. Two capsules daily     HYDROcodone-acetaminophen 5-325 MG per tablet  Commonly known as:  NORCO  Take 1 tablet by mouth every 6 (six) hours as needed for moderate pain.     losartan-hydrochlorothiazide 100-12.5 MG per tablet  Commonly known as:  HYZAAR  Take 1 tablet by mouth every morning.     multivitamin capsule  Take 1 capsule by mouth every morning.     ondansetron 4 MG tablet  Commonly known as:  ZOFRAN  Take 1 tablet (4 mg total) by mouth every 8 (eight) hours as needed for nausea or vomiting.     phenazopyridine 200 MG tablet  Commonly known as:  PYRIDIUM  Take 1 tablet (200 mg total) by mouth 3 (three) times daily as needed for pain.     PRESERVISION AREDS PO  Take 1 tablet by mouth every morning. Take as directed     SYNTHROID 88 MCG tablet  Generic drug:  levothyroxine  Take 88 mcg by mouth every morning.     Vitamin D-3 5000 UNITS Tabs  Take 5,000 Units by mouth every evening.         Significant Diagnostic Studies:  No results found.  Brief H and P: For complete details please refer to admission H and P, but in brief the patient is admitted for surgical management of a large bladder tumor.  Hospital Course:  Active Problems:   Bladder cancer  the patient underwent TURBT. She tolerated the procedure well. Because of the small extraperitoneal perforation, the catheter was left in more than 24 hours. She voided well after the catheter  was removed on postoperative day #2. She was discharged at that time and stable condition  Day of Discharge BP 118/68 mmHg  Pulse 78  Temp(Src) 97.9 F (36.6 C) (Oral)  Resp 16  Ht 5\' 7"  (1.702 m)  Wt 123 lb 0.3 oz (55.8 kg)  BMI 19.26 kg/m2  SpO2 100%  No results found for this or any previous visit (from the past 24 hour(s)).  Physical Exam: General: Alert and awake oriented x3 not in any acute distress. HEENT: anicteric sclera, pupils reactive to light and accommodation CVS: S1-S2 clear no murmur rubs or gallops Chest: clear to auscultation bilaterally, no wheezing rales or rhonchi Abdomen: soft nontender, nondistended, normal bowel sounds, no organomegaly Extremities: no cyanosis, clubbing or edema noted bilaterally Neuro: Cranial nerves II-XII intact, no focal neurological deficits  Disposition:  Home  Diet:  No restrictions  Activity:  Discussed with patient   Disposition and Follow-up:     Discharge Instructions    Discharge patient    Complete by:  As directed             We will arrange follow-up  TESTS THAT NEED FOLLOW-UP  Pathology review  DISCHARGE FOLLOW-UP Follow-up Information    Follow up with Malka So, MD On 05/14/2014.   Specialty:  Urology   Why:  [address]   Contact information:   [place] [id number] [phone]       Time spent on Discharge:  15 minutes  Signed: Jorja Loa 05/02/2014, 8:46 AM

## 2014-05-14 DIAGNOSIS — N1330 Unspecified hydronephrosis: Secondary | ICD-10-CM | POA: Diagnosis not present

## 2014-05-14 DIAGNOSIS — C662 Malignant neoplasm of left ureter: Secondary | ICD-10-CM | POA: Diagnosis not present

## 2014-05-14 DIAGNOSIS — C672 Malignant neoplasm of lateral wall of bladder: Secondary | ICD-10-CM | POA: Diagnosis not present

## 2014-05-14 DIAGNOSIS — C670 Malignant neoplasm of trigone of bladder: Secondary | ICD-10-CM | POA: Diagnosis not present

## 2014-05-18 ENCOUNTER — Other Ambulatory Visit: Payer: Self-pay | Admitting: Urology

## 2014-05-18 DIAGNOSIS — N1330 Unspecified hydronephrosis: Secondary | ICD-10-CM | POA: Diagnosis not present

## 2014-05-18 DIAGNOSIS — C672 Malignant neoplasm of lateral wall of bladder: Secondary | ICD-10-CM | POA: Diagnosis not present

## 2014-05-18 DIAGNOSIS — N8320 Unspecified ovarian cysts: Secondary | ICD-10-CM | POA: Diagnosis not present

## 2014-05-19 ENCOUNTER — Other Ambulatory Visit: Payer: Self-pay | Admitting: Urology

## 2014-06-25 ENCOUNTER — Inpatient Hospital Stay (HOSPITAL_COMMUNITY)
Admission: RE | Admit: 2014-06-25 | Discharge: 2014-07-02 | DRG: 654 | Disposition: A | Discharge status: Skilled Nursing Facility | Payer: Medicare Other | Source: Ambulatory Visit | Attending: Urology | Admitting: Urology

## 2014-06-25 ENCOUNTER — Encounter (HOSPITAL_COMMUNITY): Payer: Self-pay

## 2014-06-25 DIAGNOSIS — Z885 Allergy status to narcotic agent status: Secondary | ICD-10-CM

## 2014-06-25 DIAGNOSIS — I10 Essential (primary) hypertension: Secondary | ICD-10-CM | POA: Diagnosis present

## 2014-06-25 DIAGNOSIS — Z85828 Personal history of other malignant neoplasm of skin: Secondary | ICD-10-CM | POA: Diagnosis not present

## 2014-06-25 DIAGNOSIS — K567 Ileus, unspecified: Secondary | ICD-10-CM | POA: Diagnosis not present

## 2014-06-25 DIAGNOSIS — N8320 Unspecified ovarian cysts: Secondary | ICD-10-CM | POA: Diagnosis present

## 2014-06-25 DIAGNOSIS — N131 Hydronephrosis with ureteral stricture, not elsewhere classified: Secondary | ICD-10-CM | POA: Diagnosis present

## 2014-06-25 DIAGNOSIS — E039 Hypothyroidism, unspecified: Secondary | ICD-10-CM | POA: Diagnosis present

## 2014-06-25 DIAGNOSIS — F419 Anxiety disorder, unspecified: Secondary | ICD-10-CM | POA: Diagnosis present

## 2014-06-25 DIAGNOSIS — K913 Postprocedural intestinal obstruction: Secondary | ICD-10-CM | POA: Diagnosis not present

## 2014-06-25 DIAGNOSIS — M6281 Muscle weakness (generalized): Secondary | ICD-10-CM | POA: Diagnosis not present

## 2014-06-25 DIAGNOSIS — N736 Female pelvic peritoneal adhesions (postinfective): Secondary | ICD-10-CM | POA: Diagnosis present

## 2014-06-25 DIAGNOSIS — R2681 Unsteadiness on feet: Secondary | ICD-10-CM | POA: Diagnosis not present

## 2014-06-25 DIAGNOSIS — C672 Malignant neoplasm of lateral wall of bladder: Secondary | ICD-10-CM | POA: Diagnosis not present

## 2014-06-25 DIAGNOSIS — R41841 Cognitive communication deficit: Secondary | ICD-10-CM | POA: Diagnosis not present

## 2014-06-25 DIAGNOSIS — C679 Malignant neoplasm of bladder, unspecified: Secondary | ICD-10-CM | POA: Diagnosis not present

## 2014-06-25 DIAGNOSIS — C678 Malignant neoplasm of overlapping sites of bladder: Secondary | ICD-10-CM | POA: Diagnosis not present

## 2014-06-25 DIAGNOSIS — Z87891 Personal history of nicotine dependence: Secondary | ICD-10-CM

## 2014-06-25 DIAGNOSIS — Z888 Allergy status to other drugs, medicaments and biological substances status: Secondary | ICD-10-CM

## 2014-06-25 DIAGNOSIS — Z79899 Other long term (current) drug therapy: Secondary | ICD-10-CM | POA: Diagnosis not present

## 2014-06-25 DIAGNOSIS — R278 Other lack of coordination: Secondary | ICD-10-CM | POA: Diagnosis not present

## 2014-06-25 DIAGNOSIS — C801 Malignant (primary) neoplasm, unspecified: Secondary | ICD-10-CM | POA: Diagnosis not present

## 2014-06-25 DIAGNOSIS — E119 Type 2 diabetes mellitus without complications: Secondary | ICD-10-CM | POA: Diagnosis present

## 2014-06-25 DIAGNOSIS — N1330 Unspecified hydronephrosis: Secondary | ICD-10-CM | POA: Diagnosis not present

## 2014-06-25 DIAGNOSIS — Z5189 Encounter for other specified aftercare: Secondary | ICD-10-CM | POA: Diagnosis not present

## 2014-06-25 DIAGNOSIS — Z8551 Personal history of malignant neoplasm of bladder: Secondary | ICD-10-CM | POA: Diagnosis not present

## 2014-06-25 LAB — COMPREHENSIVE METABOLIC PANEL
ALT: 17 U/L (ref 14–54)
AST: 27 U/L (ref 15–41)
Albumin: 4.5 g/dL (ref 3.5–5.0)
Alkaline Phosphatase: 60 U/L (ref 38–126)
Anion gap: 8 (ref 5–15)
BILIRUBIN TOTAL: 0.6 mg/dL (ref 0.3–1.2)
BUN: 18 mg/dL (ref 6–20)
CHLORIDE: 97 mmol/L — AB (ref 101–111)
CO2: 27 mmol/L (ref 22–32)
CREATININE: 0.97 mg/dL (ref 0.44–1.00)
Calcium: 9.1 mg/dL (ref 8.9–10.3)
GFR calc non Af Amer: 50 mL/min — ABNORMAL LOW (ref >60)
GFR, EST AFRICAN AMERICAN: 58 mL/min — AB (ref >60)
Glucose, Bld: 173 mg/dL — ABNORMAL HIGH (ref 65–99)
Potassium: 3.5 mmol/L (ref 3.5–5.1)
SODIUM: 132 mmol/L — AB (ref 135–145)
Total Protein: 7.3 g/dL (ref 6.5–8.1)

## 2014-06-25 LAB — CBC
HCT: 40.7 % (ref 36.0–46.0)
HEMOGLOBIN: 13.6 g/dL (ref 12.0–15.0)
MCH: 30.2 pg (ref 26.0–34.0)
MCHC: 33.4 g/dL (ref 30.0–36.0)
MCV: 90.4 fL (ref 78.0–100.0)
PLATELETS: ADEQUATE 10*3/uL (ref 150–400)
RBC: 4.5 MIL/uL (ref 3.87–5.11)
RDW: 12.4 % (ref 11.5–15.5)
WBC: 6.3 10*3/uL (ref 4.0–10.5)

## 2014-06-25 LAB — PREPARE RBC (CROSSMATCH)

## 2014-06-25 LAB — ABO/RH: ABO/RH(D): O NEG

## 2014-06-25 MED ORDER — LOSARTAN POTASSIUM-HCTZ 100-12.5 MG PO TABS: 1.0000 | ORAL_TABLET | Freq: Every morning | ORAL | Status: DC

## 2014-06-25 MED ORDER — SODIUM CHLORIDE 0.9 % IV SOLN
INTRAVENOUS | Status: DC
  Administered 2014-06-25: 15:00:00 via INTRAVENOUS

## 2014-06-25 MED ORDER — LOSARTAN POTASSIUM 50 MG PO TABS
100.0000 mg | ORAL_TABLET | Freq: Every day | ORAL | Status: DC
  Administered 2014-06-28 – 2014-07-02 (×5): 100 mg via ORAL
  Filled 2014-06-25 (×7): qty 2

## 2014-06-25 MED ORDER — ONDANSETRON HCL 4 MG PO TABS: 4.0000 mg | ORAL_TABLET | Freq: Three times a day (TID) | ORAL | Status: DC | PRN

## 2014-06-25 MED ORDER — PHENAZOPYRIDINE HCL 200 MG PO TABS: 200.0000 mg | ORAL_TABLET | Freq: Three times a day (TID) | ORAL | Status: DC | PRN

## 2014-06-25 MED ORDER — PIPERACILLIN-TAZOBACTAM 3.375 G IVPB 30 MIN
3.3750 g | INTRAVENOUS | Status: AC
  Administered 2014-06-26: 3.375 g via INTRAVENOUS
  Filled 2014-06-25 (×3): qty 50

## 2014-06-25 MED ORDER — LEVOTHYROXINE SODIUM 88 MCG PO TABS: 88.0000 ug | ORAL_TABLET | Freq: Every morning | ORAL | Status: DC

## 2014-06-25 MED ORDER — LEVOTHYROXINE SODIUM 88 MCG PO TABS
88.0000 ug | ORAL_TABLET | Freq: Every day | ORAL | Status: DC
  Administered 2014-06-27 – 2014-07-02 (×6): 88 ug via ORAL
  Filled 2014-06-25 (×8): qty 1

## 2014-06-25 MED ORDER — HYDROCHLOROTHIAZIDE 12.5 MG PO CAPS
12.5000 mg | ORAL_CAPSULE | Freq: Every day | ORAL | Status: DC
  Filled 2014-06-25: qty 1

## 2014-06-25 MED ORDER — PEG 3350-KCL-NA BICARB-NACL 420 G PO SOLR
4000.0000 mL | Freq: Once | ORAL | Status: AC
  Administered 2014-06-25: 4000 mL via ORAL

## 2014-06-25 NOTE — Progress Notes (Signed)
Pt walked up w/ staff member w/ husband. Victoria Patrick

## 2014-06-25 NOTE — Consult Note (Signed)
WOC ostomy consult note Patient seen for ostomy site selection and marking per Dr. Zettie Pho request.  Abdomen assessed in the sitting, lying and standing positions.  There is a marked crease at the umbilicus that extends 5cm and dips downward toward the lower quadrants.  Site selected is in the RUQ, 6.5cm to the right of the umbilicus and 1cm above.  Patient is able to visualize mark.  Elta [place] is made using a surgical site marking pen and is covered with a thin film transparent dressing. Patient understands that post operatively, a [place] will be available to teach her the care and management of an ostomy and assist with pouching. She also is taught that more likely than not, she will be able to access the services of a HHRN to further assist with integration of an ostomy into her ADLs and lifestyle.  She is amenable to those ideas.  I look forward to assisting in the care and recovery of this nice woman. [facility] nursing team will follow, will remain available to this patient, the nursing and medical teams.  Please re-consult post op. Thanks, Maudie Flakes, MSN, RN, [address], [place], [place] [phone])

## 2014-06-25 NOTE — H&P (Signed)
Victoria Patrick is an 82 y.o. female.    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 Found on eval gross hematura. Left ureter succesfully unroofed. Path T1G3, but clinically stage 3 with malignant hydro. Staging CT w/o pelvic adenopathy or distant disease.   2 - Left Hydronephrosis - moderate left hydro by CT 04/2014 on eval above. Improved after TURBT on f/u US 05/2014. Cr pre-TURBT 1.0.   3 - Right Ovarian Cyst - 5.6 cm right ovarian cystic lesion incidental on CT 2016. Clincally localized, no mass effect.   PMH sig for cateract surgery. NO CV disease. No strong blood thinners. She lives unrepentantly and drives. SHe is widowed but has some family near by. Her PCP is Victoria Pretty MD.  Today " Victoria Patrick " is seen as pre-op admission for stomal consult, labs, bowel prep prior to cystectomy with pelvic exenteration and ileal conduit tomorrow.   Past Medical History  Diagnosis Date  . Hx of bronchitis     age 18   . Boils     hx of  years ago   . Hypertension   . Dysrhythmia     occasional skip   . Hypothyroidism   . Diabetes mellitus without complication     borderline on no meds   . Anxiety   . Cancer     hx of skin cancer on face     Past Surgical History  Procedure Laterality Date  . Hx of skin cancer removal      locally done   . Cystoscopy N/A 04/30/2014    Procedure: CYSTOSCOPY;  Surgeon: Irine Seal, MD;  Location: [facility];  Service: Urology;  Laterality: N/A;  . Transurethral resection of bladder tumor N/A 04/30/2014    Procedure: TRANSURETHRAL RESECTION OF BLADDER TUMOR (TURBT);  Surgeon: Irine Seal, MD;  Location: [facility];  Service: Urology;  Laterality: N/A;    No family history on file. Social History:  reports that she has quit smoking. She has never used smokeless tobacco. She  reports that she drinks alcohol. She reports that she does not use illicit drugs.  Allergies:  Allergies  Allergen Reactions  . Diovan [Valsartan] Itching  . Codeine Nausea And Vomiting    Medications Prior to Admission  Medication Sig Dispense Refill  . calcium-vitamin D (OSCAL WITH D) 500-200 MG-UNIT per tablet Take 1 tablet by mouth every morning.     . Cholecalciferol (VITAMIN D-3) 5000 UNITS TABS Take 5,000 Units by mouth every evening.     [name] HYDROcodone-acetaminophen (NORCO) 5-325 MG per tablet Take 1 tablet by mouth every 6 (six) hours as needed for moderate pain. 15 tablet 0  . losartan-hydrochlorothiazide (HYZAAR) 100-12.5 MG per tablet Take 1 tablet by mouth every morning.     . Multiple Vitamin (MULTIVITAMIN) capsule Take 1 capsule by mouth every morning.     . Multiple Vitamins-Minerals (PRESERVISION AREDS PO) Take 1 tablet by mouth every morning. Take as directed    . Omega-3 Fatty Acids (FISH OIL) 1000 MG CAPS Take 2 capsules by mouth every morning. Two capsules daily    . ondansetron (ZOFRAN) 4 MG tablet Take 1 tablet (4 mg total) by mouth every 8 (eight) hours as needed for nausea or vomiting. 12 tablet 0  . phenazopyridine (PYRIDIUM) 200 MG tablet Take 1 tablet (200 mg total) by  mouth 3 (three) times daily as needed for pain. 15 tablet 0  . SYNTHROID 88 MCG tablet Take 88 mcg by mouth every morning.       No results found for this or any previous visit (from the past 48 hour(s)). No results found.  Review of Systems  Constitutional: Negative.  Negative for fever and chills.  HENT: Negative.   Eyes: Negative.   Respiratory: Negative.   Cardiovascular: Negative.   Gastrointestinal: Negative.   Genitourinary: Positive for hematuria.  Musculoskeletal: Negative.   Skin: Negative.   Neurological: Negative.   Endo/Heme/Allergies: Negative.   Psychiatric/Behavioral: Negative.     Blood pressure 138/74, pulse 95, temperature 97.4 F (36.3 C), temperature source Oral,  resp. rate 18, height 5\' 7"  (1.702 m), weight 59.875 kg (132 lb), SpO2 99 %. Physical Exam  Constitutional: She appears well-developed.  Very vigorous for age  HENT:  Head: Normocephalic.  Eyes: Pupils are equal, round, and reactive to light.  Neck: Normal range of motion.  Cardiovascular: Normal rate.   Respiratory: Effort normal.  GI: Soft.  Genitourinary:  No CVAT  Musculoskeletal: Normal range of motion.  Neurological: She is alert.  Skin: Skin is warm.  Psychiatric: She has a normal mood and affect. Her behavior is normal. Judgment and thought content normal.     Assessment/Plan    1 - High Grade Bladder Cancer with Malignant Ureteral Obstruction - We rediscussed the role of radical cystectomy + lymph node dissection with concomitant prostatectomy in female and hysterectomy / oophorectomy in female and ileal conduit urinary diversion with the overall goal of complete surgical excision (negative margins) and better staging / diagnosis. We specifically rediscussed alternatives including chemo-radiation, palliative therapies, and the role of neoadjuvant chemotherapy. We then rediscussed surgical approaches including robotic and open techniques with robotic associated with a shorter convalescence. I showed the patient on their abdomen the approximately 4-6 incision (trocar) sites as well as presumed extraction sites with robotic approach as well as possible open incision sites. I also showed them potential sites for the ileal conduit and spent significant time explaining the "plumbing" of this with regards to GI and GU tracts and specific risks of diversion including ureteral stricture. We specifically readdressed that there may be need to alter operative plans according to intraopertive findings including conversion to open procedure. We rediscussed specific peri-operative risks including bleeding, infection, deep vein thrombosis, pulmonary embolism, compartment syndrome, nuropathy /  neuropraxia, bowel leak, bowel stricture, heart attack, stroke, death, as well as long-term risks such as non-cure / need for additional therapy and need for imaging and lab based post-op surveillance protocols. We rediscussed typical hospital course of approximately 5-7 day hospitalization, need for peri-operative drains / catheters, and typical post-hospital course with return to most non-strenuous activities by 4 weeks and ability to return to most jobs and more strenuous activity such as exercise by 8 weeks but with complete return to baseline often taking 26mos plus.  After this lengthy and detail discussion, including answering all of the patient's questions to their satisfaction, they have chosen to proceed with cystectomy tomorrow as planned.   2 - Left Hydronephrosis - malignant, plan as per above. Fortunately her GFR remains excellent at present.   3 - Right Ovarian Cyst - unclear etiology, again, cystetomy above would remove and likley be curative regardless of etiology.  Torri Langston 06/25/2014, 1:16 PM

## 2014-06-26 ENCOUNTER — Encounter (HOSPITAL_COMMUNITY): Payer: Self-pay | Admitting: Certified Registered"

## 2014-06-26 ENCOUNTER — Encounter (HOSPITAL_COMMUNITY)
Admission: RE | Disposition: A | Discharge status: Skilled Nursing Facility | Payer: Self-pay | Source: Ambulatory Visit | Attending: Urology

## 2014-06-26 ENCOUNTER — Inpatient Hospital Stay (HOSPITAL_COMMUNITY): Payer: Medicare Other | Admitting: Certified Registered"

## 2014-06-26 HISTORY — PX: ROBOT ASSISTED LAPAROSCOPIC COMPLETE CYSTECT ILEAL CONDUIT: SHX5139

## 2014-06-26 HISTORY — PX: CYSTOSCOPY WITH INJECTION: SHX1424

## 2014-06-26 HISTORY — PX: ROBOTIC ASSISTED LAPAROSCOPIC HYSTERECTOMY AND SALPINGECTOMY: SHX6379

## 2014-06-26 LAB — GLUCOSE, CAPILLARY
GLUCOSE-CAPILLARY: 94 mg/dL (ref 65–99)
GLUCOSE-CAPILLARY: 180 mg/dL — AB (ref 65–99)

## 2014-06-26 LAB — SURGICAL PCR SCREEN
MRSA, PCR: NEGATIVE
STAPHYLOCOCCUS AUREUS: NEGATIVE

## 2014-06-26 LAB — HEMOGLOBIN AND HEMATOCRIT, BLOOD
HCT: 36.4 % (ref 36.0–46.0)
Hemoglobin: 12.3 g/dL (ref 12.0–15.0)

## 2014-06-26 SURGERY — ROBOTIC ASSISTED LAPAROSCOPIC COMPLETE CYSTECT ILEAL CONDUIT
Anesthesia: General

## 2014-06-26 MED ORDER — PROPOFOL 10 MG/ML IV BOLUS
INTRAVENOUS | Status: DC | PRN
  Administered 2014-06-26: 140 mg via INTRAVENOUS

## 2014-06-26 MED ORDER — BUPIVACAINE HCL (PF) 0.25 % IJ SOLN
INTRAMUSCULAR | Status: DC | PRN
  Administered 2014-06-26: 19 mL

## 2014-06-26 MED ORDER — HEPARIN SODIUM (PORCINE) 5000 UNIT/ML IJ SOLN
5000.0000 [IU] | Freq: Three times a day (TID) | INTRAMUSCULAR | Status: DC
  Administered 2014-06-27 – 2014-06-30 (×9): 5000 [IU] via SUBCUTANEOUS
  Filled 2014-06-26 (×12): qty 1

## 2014-06-26 MED ORDER — HYDROMORPHONE 0.3 MG/ML IV SOLN
INTRAVENOUS | Status: DC
  Administered 2014-06-26: 15:00:00 via INTRAVENOUS
  Administered 2014-06-26: 0 mg via INTRAVENOUS
  Administered 2014-06-27: 0.3 mg via INTRAVENOUS
  Administered 2014-06-27: 0 mg via INTRAVENOUS
  Administered 2014-06-27: 2.7 mg via INTRAVENOUS
  Administered 2014-06-27 – 2014-06-28 (×2): 0 mg via INTRAVENOUS
  Administered 2014-06-28: 0.3 mg via INTRAVENOUS
  Administered 2014-06-28: 25 mL via INTRAVENOUS
  Administered 2014-06-28: 0.3 mg via INTRAVENOUS
  Administered 2014-06-28: 0 mg via INTRAVENOUS
  Administered 2014-06-28: 0.3 mg via INTRAVENOUS
  Administered 2014-06-29 (×5): 0 mg via INTRAVENOUS
  Filled 2014-06-26: qty 25

## 2014-06-26 MED ORDER — HYDROMORPHONE 0.3 MG/ML IV SOLN
INTRAVENOUS | Status: AC
  Filled 2014-06-26: qty 25

## 2014-06-26 MED ORDER — LACTATED RINGERS IV SOLN: INTRAVENOUS | Status: DC

## 2014-06-26 MED ORDER — BUPIVACAINE 0.25 % ON-Q PUMP SINGLE CATH 300ML
300.0000 mL | INJECTION | Status: DC
  Administered 2014-06-26: 300 mL
  Filled 2014-06-26: qty 300

## 2014-06-26 MED ORDER — PIPERACILLIN-TAZOBACTAM 3.375 G IVPB
INTRAVENOUS | Status: AC
  Filled 2014-06-26: qty 50

## 2014-06-26 MED ORDER — PHENYLEPHRINE 40 MCG/ML (10ML) SYRINGE FOR IV PUSH (FOR BLOOD PRESSURE SUPPORT)
PREFILLED_SYRINGE | INTRAVENOUS | Status: AC
  Filled 2014-06-26: qty 10

## 2014-06-26 MED ORDER — MIDAZOLAM HCL 2 MG/2ML IJ SOLN
INTRAMUSCULAR | Status: AC
  Filled 2014-06-26: qty 2

## 2014-06-26 MED ORDER — ROCURONIUM BROMIDE 100 MG/10ML IV SOLN
INTRAVENOUS | Status: DC | PRN
  Administered 2014-06-26: 45 mg via INTRAVENOUS
  Administered 2014-06-26 (×2): 10 mg via INTRAVENOUS
  Administered 2014-06-26: 5 mg via INTRAVENOUS
  Administered 2014-06-26: 10 mg via INTRAVENOUS
  Administered 2014-06-26: 20 mg via INTRAVENOUS
  Administered 2014-06-26 (×2): 10 mg via INTRAVENOUS
  Administered 2014-06-26: 20 mg via INTRAVENOUS

## 2014-06-26 MED ORDER — ACETAMINOPHEN 500 MG PO TABS
1000.0000 mg | ORAL_TABLET | Freq: Three times a day (TID) | ORAL | Status: AC
  Administered 2014-06-26 – 2014-06-27 (×2): 1000 mg via ORAL
  Filled 2014-06-26 (×2): qty 2

## 2014-06-26 MED ORDER — DROPERIDOL 2.5 MG/ML IJ SOLN
0.6250 mg | INTRAMUSCULAR | Status: DC | PRN
  Filled 2014-06-26: qty 0.25

## 2014-06-26 MED ORDER — NEOSTIGMINE METHYLSULFATE 10 MG/10ML IV SOLN
INTRAVENOUS | Status: DC | PRN
  Administered 2014-06-26: 4 mg via INTRAVENOUS

## 2014-06-26 MED ORDER — PROPOFOL 10 MG/ML IV BOLUS
INTRAVENOUS | Status: AC
  Filled 2014-06-26: qty 20

## 2014-06-26 MED ORDER — NALOXONE HCL 0.4 MG/ML IJ SOLN: 0.4000 mg | INTRAMUSCULAR | Status: DC | PRN

## 2014-06-26 MED ORDER — EPHEDRINE SULFATE 50 MG/ML IJ SOLN
INTRAMUSCULAR | Status: DC | PRN
  Administered 2014-06-26 (×3): 5 mg via INTRAVENOUS
  Administered 2014-06-26: 10 mg via INTRAVENOUS

## 2014-06-26 MED ORDER — LACTATED RINGERS IV SOLN
INTRAVENOUS | Status: DC | PRN
Start: 2014-06-26 — End: 2014-06-26
  Administered 2014-06-26: 07:00:00 via INTRAVENOUS

## 2014-06-26 MED ORDER — KCL IN DEXTROSE-NACL 20-5-0.45 MEQ/L-%-% IV SOLN
INTRAVENOUS | Status: DC
  Administered 2014-06-26 – 2014-06-27 (×2): via INTRAVENOUS
  Administered 2014-06-27: 1000 mL via INTRAVENOUS
  Administered 2014-06-27 – 2014-06-30 (×4): via INTRAVENOUS
  Filled 2014-06-26 (×8): qty 1000

## 2014-06-26 MED ORDER — NALOXONE HCL 0.4 MG/ML IJ SOLN
INTRAMUSCULAR | Status: DC | PRN
  Administered 2014-06-26: 40 ug via INTRAVENOUS

## 2014-06-26 MED ORDER — GLYCOPYRROLATE 0.2 MG/ML IJ SOLN
INTRAMUSCULAR | Status: DC | PRN
  Administered 2014-06-26: 0.6 mg via INTRAVENOUS

## 2014-06-26 MED ORDER — PIPERACILLIN-TAZOBACTAM 3.375 G IVPB
3.3750 g | Freq: Three times a day (TID) | INTRAVENOUS | Status: AC
  Administered 2014-06-26 – 2014-06-28 (×6): 3.375 g via INTRAVENOUS
  Filled 2014-06-26 (×10): qty 50

## 2014-06-26 MED ORDER — BUPIVACAINE ON-Q PAIN PUMP (FOR ORDER SET NO CHG)
INJECTION | Status: DC
  Filled 2014-06-26: qty 1

## 2014-06-26 MED ORDER — HYDROMORPHONE HCL 2 MG/ML IJ SOLN
INTRAMUSCULAR | Status: AC
  Filled 2014-06-26: qty 1

## 2014-06-26 MED ORDER — ONDANSETRON HCL 4 MG/2ML IJ SOLN
INTRAMUSCULAR | Status: AC
  Filled 2014-06-26: qty 2

## 2014-06-26 MED ORDER — SODIUM CHLORIDE 0.9 % IJ SOLN: 9.0000 mL | INTRAMUSCULAR | Status: DC | PRN

## 2014-06-26 MED ORDER — FENTANYL CITRATE (PF) 250 MCG/5ML IJ SOLN
INTRAMUSCULAR | Status: AC
  Filled 2014-06-26: qty 5

## 2014-06-26 MED ORDER — SENNOSIDES-DOCUSATE SODIUM 8.6-50 MG PO TABS
2.0000 | ORAL_TABLET | Freq: Every day | ORAL | Status: DC
  Administered 2014-06-28 – 2014-06-30 (×2): 2 via ORAL
  Filled 2014-06-26 (×7): qty 2

## 2014-06-26 MED ORDER — STERILE WATER FOR IRRIGATION IR SOLN
Status: DC | PRN
  Administered 2014-06-26: 3000 mL

## 2014-06-26 MED ORDER — ONDANSETRON HCL 4 MG/2ML IJ SOLN
4.0000 mg | INTRAMUSCULAR | Status: DC | PRN
  Administered 2014-06-26: 4 mg via INTRAVENOUS

## 2014-06-26 MED ORDER — DEXAMETHASONE SODIUM PHOSPHATE 10 MG/ML IJ SOLN
INTRAMUSCULAR | Status: DC | PRN
  Administered 2014-06-26: 10 mg via INTRAVENOUS

## 2014-06-26 MED ORDER — NALOXONE HCL 0.4 MG/ML IJ SOLN
INTRAMUSCULAR | Status: AC
  Filled 2014-06-26: qty 1

## 2014-06-26 MED ORDER — FENTANYL CITRATE (PF) 100 MCG/2ML IJ SOLN
INTRAMUSCULAR | Status: DC | PRN
  Administered 2014-06-26 (×2): 50 ug via INTRAVENOUS
  Administered 2014-06-26: 100 ug via INTRAVENOUS
  Administered 2014-06-26: 50 ug via INTRAVENOUS

## 2014-06-26 MED ORDER — DEXAMETHASONE SODIUM PHOSPHATE 10 MG/ML IJ SOLN
INTRAMUSCULAR | Status: AC
  Filled 2014-06-26: qty 1

## 2014-06-26 MED ORDER — FENTANYL CITRATE (PF) 100 MCG/2ML IJ SOLN: 25.0000 ug | INTRAMUSCULAR | Status: DC | PRN

## 2014-06-26 MED ORDER — DIPHENHYDRAMINE HCL 12.5 MG/5ML PO ELIX: 12.5000 mg | ORAL_SOLUTION | Freq: Four times a day (QID) | ORAL | Status: DC | PRN

## 2014-06-26 MED ORDER — LACTATED RINGERS IV SOLN
INTRAVENOUS | Status: DC | PRN
  Administered 2014-06-26 (×4): via INTRAVENOUS

## 2014-06-26 MED ORDER — DIPHENHYDRAMINE HCL 50 MG/ML IJ SOLN: 12.5000 mg | Freq: Four times a day (QID) | INTRAMUSCULAR | Status: DC | PRN

## 2014-06-26 MED ORDER — ONDANSETRON HCL 4 MG/2ML IJ SOLN
4.0000 mg | Freq: Four times a day (QID) | INTRAMUSCULAR | Status: DC | PRN
  Filled 2014-06-26: qty 2

## 2014-06-26 MED ORDER — PHENYLEPHRINE HCL 10 MG/ML IJ SOLN
INTRAMUSCULAR | Status: DC | PRN
  Administered 2014-06-26: 80 ug via INTRAVENOUS

## 2014-06-26 MED ORDER — LIDOCAINE HCL (CARDIAC) 20 MG/ML IV SOLN
INTRAVENOUS | Status: DC | PRN
  Administered 2014-06-26: 50 mg via INTRAVENOUS

## 2014-06-26 MED ORDER — ONDANSETRON HCL 4 MG/2ML IJ SOLN
INTRAMUSCULAR | Status: DC | PRN
  Administered 2014-06-26: 4 mg via INTRAVENOUS

## 2014-06-26 MED ORDER — SUCCINYLCHOLINE CHLORIDE 20 MG/ML IJ SOLN
INTRAMUSCULAR | Status: DC | PRN
  Administered 2014-06-26: 100 mg via INTRAVENOUS

## 2014-06-26 MED ORDER — ROCURONIUM BROMIDE 100 MG/10ML IV SOLN
INTRAVENOUS | Status: AC
  Filled 2014-06-26: qty 1

## 2014-06-26 MED ORDER — HYDROMORPHONE HCL 1 MG/ML IJ SOLN
INTRAMUSCULAR | Status: DC | PRN
  Administered 2014-06-26 (×4): 0.5 mg via INTRAVENOUS

## 2014-06-26 MED ORDER — LACTATED RINGERS IR SOLN
Status: DC | PRN
  Administered 2014-06-26: 1000 mL

## 2014-06-26 MED ORDER — BUPIVACAINE HCL (PF) 0.25 % IJ SOLN
INTRAMUSCULAR | Status: AC
  Filled 2014-06-26: qty 30

## 2014-06-26 MED ORDER — SODIUM CHLORIDE 0.9 % IV SOLN
INTRAVENOUS | Status: DC
  Administered 2014-06-26: 18:00:00 via INTRAVENOUS

## 2014-06-26 MED ORDER — LIDOCAINE HCL (CARDIAC) 20 MG/ML IV SOLN
INTRAVENOUS | Status: AC
  Filled 2014-06-26: qty 5

## 2014-06-26 SURGICAL SUPPLY — 82 items
APL ESCP 34 STRL LF DISP (HEMOSTASIS)
APL SKNCLS STERI-STRIP NONHPOA (GAUZE/BANDAGES/DRESSINGS) ×2
APPLICATOR SURGIFLO ENDO (HEMOSTASIS) IMPLANT
BAG URINE DRAINAGE (UROLOGICAL SUPPLIES) IMPLANT
BAG URO CATCHER STRL LF (DRAPE) ×3 IMPLANT
BENZOIN TINCTURE PRP APPL 2/3 (GAUZE/BANDAGES/DRESSINGS) ×4 IMPLANT
BLADE SURG SZ10 CARB STEEL (BLADE) IMPLANT
CABLE HIGH FREQUENCY MONO STRZ (ELECTRODE) ×3 IMPLANT
CATH FOLEY 2WAY SLVR 18FR 30CC (CATHETERS) ×3 IMPLANT
CHLORAPREP W/TINT 26ML (MISCELLANEOUS) ×3 IMPLANT
CLIP LIGATING HEM O LOK PURPLE (MISCELLANEOUS) ×6 IMPLANT
CLIP LIGATING HEMO LOK XL GOLD (MISCELLANEOUS) ×5 IMPLANT
CLIP LIGATING HEMO O LOK GREEN (MISCELLANEOUS) ×3 IMPLANT
COVER SURGICAL LIGHT HANDLE (MISCELLANEOUS) ×3 IMPLANT
COVER TIP SHEARS 8 DVNC (MISCELLANEOUS) ×1 IMPLANT
COVER TIP SHEARS 8MM DA VINCI (MISCELLANEOUS) ×2
DRAPE TABLE BACK 44X90 PK DISP (DRAPES) ×2 IMPLANT
DRAPE WARM FLUID 44X44 (DRAPE) IMPLANT
DRSG TEGADERM 6X8 (GAUZE/BANDAGES/DRESSINGS) ×6 IMPLANT
ELECT CAUTERY BLADE 6.4 (BLADE) ×3 IMPLANT
ELECT REM PT RETURN 9FT ADLT (ELECTROSURGICAL) ×3
ELECTRODE REM PT RTRN 9FT ADLT (ELECTROSURGICAL) ×1 IMPLANT
GLOVE BIO SURGEON STRL SZ 6.5 (GLOVE) ×6 IMPLANT
GLOVE BIO SURGEONS STRL SZ 6.5 (GLOVE) ×4
GLOVE BIOGEL M STRL SZ7.5 (GLOVE) ×9 IMPLANT
GOWN STRL REUS W/TWL LRG LVL3 (GOWN DISPOSABLE) ×17 IMPLANT
KIT ACCESSORY DA VINCI DISP (KITS) ×2
KIT ACCESSORY DVNC DISP (KITS) ×1 IMPLANT
KIT PROCEDURE DA VINCI SI (MISCELLANEOUS) ×2
KIT PROCEDURE DVNC SI (MISCELLANEOUS) ×1 IMPLANT
LIQUID BAND (GAUZE/BANDAGES/DRESSINGS) ×4 IMPLANT
LOOP VESSEL MAXI BLUE (MISCELLANEOUS) ×3 IMPLANT
NDL INSUFFLATION 14GA 120MM (NEEDLE) ×1 IMPLANT
NEEDLE INSUFFLATION 14GA 120MM (NEEDLE) ×3 IMPLANT
PACK CYSTO (CUSTOM PROCEDURE TRAY) ×3 IMPLANT
PACK ROBOT UROLOGY CUSTOM (CUSTOM PROCEDURE TRAY) ×3 IMPLANT
PAD POSITIONING PINK XL (MISCELLANEOUS) ×2 IMPLANT
PEN SKIN MARKING BROAD (MISCELLANEOUS) ×2 IMPLANT
PORT ACCESS TROCAR AIRSEAL 12 (TROCAR) IMPLANT
PORT ACCESS TROCAR AIRSEAL 5M (TROCAR) ×2
POSITIONER SURGICAL ARM (MISCELLANEOUS) ×2 IMPLANT
POUCH ENDO CATCH II 15MM (MISCELLANEOUS) ×2 IMPLANT
PUMP PAIN ON-Q (MISCELLANEOUS) IMPLANT
RELOAD STAPLE 60 2.6 WHT THN (STAPLE) ×2 IMPLANT
RELOAD STAPLE 60 4.1 GRN THCK (STAPLE) ×1 IMPLANT
RELOAD STAPLER GREEN 60MM (STAPLE) ×4 IMPLANT
RELOAD STAPLER WHITE 60MM (STAPLE) ×7 IMPLANT
SET TRI-LUMEN FLTR TB AIRSEAL (TUBING) ×2 IMPLANT
SET TUBE IRRIG SUCTION NO TIP (IRRIGATION / IRRIGATOR) ×3 IMPLANT
SHEET LAVH (DRAPES) ×2 IMPLANT
SOLUTION ELECTROLUBE (MISCELLANEOUS) ×3 IMPLANT
SPONGE LAP 18X18 X RAY DECT (DISPOSABLE) ×6 IMPLANT
SPONGE LAP 4X18 X RAY DECT (DISPOSABLE) ×3 IMPLANT
STAPLE ECHEON FLEX 60 POW ENDO (STAPLE) ×3 IMPLANT
STAPLER RELOAD GREEN 60MM (STAPLE) ×12
STAPLER RELOAD WHITE 60MM (STAPLE) ×21
STENT SET URETHERAL LEFT 7FR (STENTS) ×3 IMPLANT
STENT SET URETHERAL RIGHT 7FR (STENTS) ×3 IMPLANT
SURGIFLO W/THROMBIN 8M KIT (HEMOSTASIS) IMPLANT
SUT CHROMIC 4 0 RB 1X27 (SUTURE) ×3 IMPLANT
SUT ETHILON 3 0 PS 1 (SUTURE) ×3 IMPLANT
SUT MNCRL AB 4-0 PS2 18 (SUTURE) ×6 IMPLANT
SUT PDS AB 0 CTX 36 PDP370T (SUTURE) ×9 IMPLANT
SUT SILK 3 0 SH CR/8 (SUTURE) ×3 IMPLANT
SUT V-LOC BARB 180 2/0GR9 GS23 (SUTURE) ×3
SUT VIC AB 2-0 UR5 27 (SUTURE) ×12 IMPLANT
SUT VIC AB 3-0 SH 27 (SUTURE) ×15
SUT VIC AB 3-0 SH 27X BRD (SUTURE) IMPLANT
SUT VIC AB 3-0 SH 27XBRD (SUTURE) ×1 IMPLANT
SUT VIC AB 4-0 RB1 27 (SUTURE) ×12
SUT VIC AB 4-0 RB1 27XBRD (SUTURE) ×6 IMPLANT
SUT VLOC 180 2-0 9IN GS21 (SUTURE) ×2 IMPLANT
SUT VLOC BARB 180 ABS3/0GR12 (SUTURE) ×3
SUTURE V-LC BRB 180 2/0GR9GS23 (SUTURE) IMPLANT
SUTURE VLOC BRB 180 ABS3/0GR12 (SUTURE) ×1 IMPLANT
SYSTEM UROSTOMY GENTLE TOUCH (WOUND CARE) ×3 IMPLANT
TOWEL OR NON WOVEN STRL DISP B (DISPOSABLE) ×3 IMPLANT
TROCAR 12M 150ML BLUNT (TROCAR) ×3 IMPLANT
TROCAR BLADELESS 15MM (ENDOMECHANICALS) ×3 IMPLANT
WATER STERILE IRR 1000ML UROMA (IV SOLUTION) ×1 IMPLANT
WATER STERILE IRR 1500ML POUR (IV SOLUTION) ×2 IMPLANT
YANKAUER SUCT BULB TIP 10FT TU (MISCELLANEOUS) ×2 IMPLANT

## 2014-06-26 NOTE — Progress Notes (Signed)
Informed Dr. Baltazar Najjar of JP output of 307 ml since 1415.  Dressing around [place] is saturated with serosanguinous fluid but is not dripping nor does it appear to be worsening.  MD stated to keep an eye on it and if it has an output of 300 ml in next three hours then to notify him.  Otherwise, continue to monitor.  Pt denies pain.  Is resting comfortably.  Vitals stable.  Will continue to monitor.  Iantha Fallen RN 5:43 PM 06/26/2014

## 2014-06-26 NOTE — Anesthesia Postprocedure Evaluation (Signed)
  Anesthesia Post-op Note  Patient: Victoria Patrick  Procedure(s) Performed: Procedure(s): ROBOTIC ASSISTED LAPAROSCOPIC COMPLETE CYSTECT ILEAL CONDUIT (N/A) ROBOTIC ASSISTED LAPAROSCOPIC HYSTERECTOMY AND BILATERAL SALPINGO-OPHERECTOMY (N/A) CYSTOSCOPY WITH INJECTION OF INDOCYANINE GREEN DYE (N/A)  Patient Location: PACU  Anesthesia Type:General  Level of Consciousness: awake, alert , oriented, patient cooperative and responds to stimulation  Airway and Oxygen Therapy: Patient Spontanous Breathing and Patient connected to nasal cannula oxygen  Post-op Pain: mild  Post-op Assessment: Post-op Vital signs reviewed, Patient's Cardiovascular Status Stable, Respiratory Function Stable, Patent Airway, No signs of Nausea or vomiting and Pain level controlled              Post-op Vital Signs: Reviewed and stable  Last Vitals:  Filed Vitals:   06/26/14 1524  BP: 126/66  Pulse: 106  Temp:   Resp: 21    Complications: No apparent anesthesia complications

## 2014-06-26 NOTE — Anesthesia Procedure Notes (Signed)
Procedure Name: Intubation Date/Time: 06/26/2014 7:32 AM Performed by: Noralyn Pick D Pre-anesthesia Checklist: Patient identified, Emergency Drugs available, Suction available and Patient being monitored Patient Re-evaluated:Patient Re-evaluated prior to inductionOxygen Delivery Method: Circle System Utilized Preoxygenation: Pre-oxygenation with 100% oxygen Intubation Type: IV induction Ventilation: Mask ventilation without difficulty Laryngoscope Size: Mac and 3 Grade View: Grade II Tube type: Oral Tube size: 7.5 mm Number of attempts: 1 Airway Equipment and Method: Stylet and Oral airway Placement Confirmation: ETT inserted through vocal cords under direct vision,  positive ETCO2 and breath sounds checked- equal and bilateral Secured at: 21 cm Tube secured with: Tape Dental Injury: Teeth and Oropharynx as per pre-operative assessment

## 2014-06-26 NOTE — Transfer of Care (Signed)
Immediate Anesthesia Transfer of Care Note  Patient: Victoria Patrick  Procedure(s) Performed: Procedure(s): ROBOTIC ASSISTED LAPAROSCOPIC COMPLETE CYSTECT ILEAL CONDUIT (N/A) ROBOTIC ASSISTED LAPAROSCOPIC HYSTERECTOMY AND BILATERAL SALPINGO-OPHERECTOMY (N/A) CYSTOSCOPY WITH INJECTION OF INDOCYANINE GREEN DYE (N/A)  Patient Location: PACU  Anesthesia Type:General  Level of Consciousness: awake, alert  and oriented  Airway & Oxygen Therapy: Patient Spontanous Breathing and Patient connected to face mask oxygen  Post-op Assessment: Report given to RN and Post -op Vital signs reviewed and stable  Post vital signs: Reviewed and stable  Last Vitals:  Filed Vitals:   06/26/14 0457  BP: 111/55  Pulse: 72  Temp: 36.7 C  Resp: 18    Complications: No apparent anesthesia complications

## 2014-06-26 NOTE — Brief Op Note (Signed)
06/25/2014 - 06/26/2014  1:26 PM  PATIENT:  Josem Kaufmann  81 y.o. female  PRE-OPERATIVE DIAGNOSIS:  BLADDER CANCER  POST-OPERATIVE DIAGNOSIS:  BLADDER CANCER  PROCEDURE:  Procedure(s): ROBOTIC ASSISTED LAPAROSCOPIC COMPLETE CYSTECT ILEAL CONDUIT (N/A) ROBOTIC ASSISTED LAPAROSCOPIC HYSTERECTOMY AND BILATERAL SALPINGO-OPHERECTOMY (N/A) CYSTOSCOPY WITH INJECTION OF INDOCYANINE GREEN DYE (N/A) Laparoscopic extensive adhesiolysis  SURGEON:  Surgeon(s) and Role:    * Alexis Frock, MD - Primary  PHYSICIAN ASSISTANT:   ASSISTANTS: Clemetine Marker, PA   ANESTHESIA:   regional and general  EBL:  Total I/O In: 3000 [I.V.:3000] Out: 100 [Blood:100]  BLOOD ADMINISTERED:none  DRAINS: 1 - Urostomy to gravity with Rt (red) LT (blue) Bander stents; 2 - JP to bulb suction; 3 - On - Q pain catheter   LOCAL MEDICATIONS USED:  MARCAINE     SPECIMEN:  Source of Specimen:  1 - bilateral ureteral margins, 2 - bilateral pelvic lymph nodes, 3 - pelvic exenteration (bladder, uterus, ovaries, tubes, cervix, anterior vaginal wall)).   DISPOSITION OF SPECIMEN:  PATHOLOGY  COUNTS:  YES  TOURNIQUET:  * No tourniquets in log *  DICTATION: .Other Dictation: Dictation Number [id number]  PLAN OF CARE: Admit to inpatient   PATIENT DISPOSITION:  PACU - hemodynamically stable.   Delay start of Pharmacological VTE agent (>24hrs) due to surgical blood loss or risk of bleeding: yes

## 2014-06-26 NOTE — Progress Notes (Signed)
  Subjective:  1 - High Grade Bladder Cancer with Malignant Ureteral Obstruction, Right Ovarian Cyst- large volume high-grade urothelial carcinoma by TURBT 04/2014 with left ureteral obtrusion / impending right ureteral obstruction. Found on eval gross hematura. Left ureter succesfully unroofed. Path T1G3, but clinically stage 3 with malignant hydro. Staging CT w/o pelvic adenopathy or distant disease. She is scheduled for cystectomy / pelvic exenteration with ilial conduit today 06/26/14.   Today " Victoria Patrick " is w/o complaints. Completed bowel prep to clear. Stomal marking performed.   Objective: Vital signs in last 24 hours: Temp:  [97.4 F (36.3 C)-98.3 F (36.8 C)] 98 F (36.7 C) (06/17 0457) Pulse Rate:  [67-95] 72 (06/17 0457) Resp:  [18] 18 (06/17 0457) BP: (111-138)/(49-74) 111/55 mmHg (06/17 0457) SpO2:  [97 %-100 %] 100 % (06/17 0457) Weight:  [59.875 kg (132 lb)] 59.875 kg (132 lb) (06/16 1255) Last BM Date: 06/25/14  Intake/Output from previous day: 06/16 0701 - 06/17 0700 In: 655 [P.O.:480; I.V.:175] Out: -  Intake/Output this shift:    General appearance: alert, cooperative, appears stated age and family at bedside Eyes: negative Nose: Nares normal. Septum midline. Mucosa normal. No drainage or sinus tenderness. Throat: lips, mucosa, and tongue normal; teeth and gums normal Neck: supple, symmetrical, trachea midline Back: symmetric, no curvature. ROM normal. No CVA tenderness. Resp: non-labored on room air Cardio: Nl rate GI: soft, non-tender; bowel sounds normal; no masses,  no organomegaly Extremities: extremities normal, atraumatic, no cyanosis or edema Skin: Skin color, texture, turgor normal. No rashes or lesions Neurologic: Grossly normal  RLQ stomal marking site noted  Lab Results:   Recent Labs  06/25/14 1511  WBC 6.3  HGB 13.6  HCT 40.7  PLT PLATELET CLUMPS NOTED ON SMEAR, COUNT APPEARS ADEQUATE   BMET  Recent Labs  06/25/14 1511  NA 132*  K  3.5  CL 97*  CO2 27  GLUCOSE 173*  BUN 18  CREATININE 0.97  CALCIUM 9.1   PT/INR No results for input(s): LABPROT, INR in the last 72 hours. ABG No results for input(s): PHART, HCO3 in the last 72 hours.  Invalid input(s): PCO2, PO2  Studies/Results: No results found.  Anti-infectives: Anti-infectives    Start     Dose/Rate Route Frequency Ordered Stop   06/26/14 0000  piperacillin-tazobactam (ZOSYN) IVPB 3.375 g     3.375 g 100 mL/hr over 30 Minutes Intravenous 30 min pre-op 06/25/14 1315        Assessment/Plan:  1 - High Grade Bladder Cancer with Malignant Ureteral Obstruction, Right Ovarian Cyst- proceed as planned with surgery today. She has very good understanding of peri-op course and risks / benefits. WIll hold HCTZ given modestly low Na.   [facility], Greer Wainright 06/26/2014

## 2014-06-26 NOTE — Discharge Instructions (Signed)
1.  Activity:  You are encouraged to ambulate frequently (about every hour during waking hours) to help prevent blood clots from forming in your legs or lungs.  However, you should not engage in any heavy lifting (> 10-15 lbs), strenuous activity, or straining. °2. Diet: You should advance your diet as instructed by your physician.  It will be normal to have some bloating, nausea, and abdominal discomfort intermittently. °3. Prescriptions:  You will be provided a prescription for pain medication to take as needed.  If your pain is not severe enough to require the prescription pain medication, you may take extra strength Tylenol instead which will have less side effects.  You should also take a prescribed stool softener to avoid straining with bowel movements as the prescription pain medication may constipate you. °4. Incisions: You may remove your dressing bandages 48 hours after surgery if not removed in the hospital.  You will either have some small staples or special tissue glue at each of the incision sites. Once the bandages are removed (if present), the incisions may stay open to air.  You may start showering (but not soaking or bathing in water) the 2nd day after surgery and the incisions simply need to be patted dry after the shower.  No additional care is needed. °5. What to call us about: You should call the office ([phone]) if you develop fever > 101 or develop persistent vomiting. °      6. You may resume aspirin, advil, aleve, vitamins, and supplements 7 days after surgery. °

## 2014-06-26 NOTE — Anesthesia Preprocedure Evaluation (Addendum)
Anesthesia Evaluation  Patient identified by MRN, date of birth, ID band Patient awake    Reviewed: Allergy & Precautions, NPO status , Patient's Chart, lab work & pertinent test results  History of Anesthesia Complications Negative for: history of anesthetic complications  Airway Mallampati: II  TM Distance: >3 FB Neck ROM: Full    Dental  (+) Dental Advisory Given   Pulmonary former smoker,  breath sounds clear to auscultation        Cardiovascular hypertension, Pt. on medications - anginaRhythm:Regular Rate:Normal     Neuro/Psych Anxiety negative neurological ROS     GI/Hepatic Neg liver ROS, GERD-  Medicated and Controlled,  Endo/Other  diabetes (diet controlled)Hypothyroidism   Renal/GU negative Renal ROS   Bladder cancer    Musculoskeletal   Abdominal   Peds  Hematology negative hematology ROS (+)   Anesthesia Other Findings   Reproductive/Obstetrics                           Anesthesia Physical Anesthesia Plan  ASA: III  Anesthesia Plan: General   Post-op Pain Management:    Induction: Intravenous  Airway Management Planned: Oral ETT  Additional Equipment:   Intra-op Plan:   Post-operative Plan: Extubation in OR  Informed Consent: I have reviewed the patients History and Physical, chart, labs and discussed the procedure including the risks, benefits and alternatives for the proposed anesthesia with the patient or authorized representative who has indicated his/her understanding and acceptance.   Dental advisory given  Plan Discussed with: Surgeon and CRNA  Anesthesia Plan Comments: (Plan routine monitors, GETA)        Anesthesia Quick Evaluation

## 2014-06-27 LAB — CBC
HEMATOCRIT: 36.2 % (ref 36.0–46.0)
HEMOGLOBIN: 11.9 g/dL — AB (ref 12.0–15.0)
MCH: 30.8 pg (ref 26.0–34.0)
MCHC: 32.9 g/dL (ref 30.0–36.0)
MCV: 93.8 fL (ref 78.0–100.0)
Platelets: 149 10*3/uL — ABNORMAL LOW (ref 150–400)
RBC: 3.86 MIL/uL — ABNORMAL LOW (ref 3.87–5.11)
RDW: 12.9 % (ref 11.5–15.5)
WBC: 11.6 10*3/uL — ABNORMAL HIGH (ref 4.0–10.5)

## 2014-06-27 LAB — BASIC METABOLIC PANEL
ANION GAP: 8 (ref 5–15)
BUN: 15 mg/dL (ref 6–20)
CHLORIDE: 102 mmol/L (ref 101–111)
CO2: 27 mmol/L (ref 22–32)
Calcium: 8.6 mg/dL — ABNORMAL LOW (ref 8.9–10.3)
Creatinine, Ser: 1.13 mg/dL — ABNORMAL HIGH (ref 0.44–1.00)
GFR calc Af Amer: 48 mL/min — ABNORMAL LOW (ref >60)
GFR calc non Af Amer: 42 mL/min — ABNORMAL LOW (ref >60)
Glucose, Bld: 174 mg/dL — ABNORMAL HIGH (ref 65–99)
Potassium: 4.6 mmol/L (ref 3.5–5.1)
Sodium: 137 mmol/L (ref 135–145)

## 2014-06-27 MED ORDER — VITAMINS A & D EX OINT
TOPICAL_OINTMENT | CUTANEOUS | Status: AC
  Administered 2014-06-27: 15:00:00
  Filled 2014-06-27: qty 5

## 2014-06-27 NOTE — Progress Notes (Signed)
OT Cancellation Note  Patient Details Name: ATALIE OROS MRN: [id number] DOB: 06/26/1922   Cancelled Treatment:    Reason Eval/Treat Not Completed: Fatigue/lethargy limiting ability to participate.  Will check back tomorrow.  Min Collymore 06/27/2014, 3:22 PM  Lesle Chris, OTR/L [phone] 06/27/2014

## 2014-06-27 NOTE — Progress Notes (Signed)
Patient arrived to floor at around 1100. Alert and orientedx3. Placed comfortably in bed. Reoriented to room. Daughters at bedside. Will continue to monitor the patient.

## 2014-06-27 NOTE — Progress Notes (Signed)
PT Cancellation Note  Patient Details Name: Victoria Patrick MRN: [id number] DOB: 1923/01/08   Cancelled Treatment:    Reason Eval/Treat Not Completed: Medical issues which prohibited therapy (low BP, defer today and attempt next day or as schedule permits)   [facility] 06/27/2014, 9:03 AM

## 2014-06-27 NOTE — Progress Notes (Signed)
Patient has increased output in JP drain, a total of 40 cc was emptied at 1421,and another 100cc of red, bloody drainage recorded at 1501 and still cont. draining,halfway full. Noted that patient's face is swollen and eyes are puffy as endorsed by ICU nurse before patient was transferred to 1334. Dr. Monico Hoar notified re: these things. He said to get patient out of bed in the chair. Continue IVF. Will continue to monitor the patient.

## 2014-06-27 NOTE — Op Note (Signed)
NAMESUNG, RENTON NO.:  [id number]  MEDICAL RECORD NO.:  [id number]  LOCATION:  7425                         FACILITY:  [facility]  PHYSICIAN:  Alexis Frock, MD     DATE OF BIRTH:  [birth year]  DATE OF PROCEDURE:  06/26/2014                               OPERATIVE REPORT   DIAGNOSIS:  High-grade bladder cancer with left malignant hydronephrosis.  PROCEDURES: 1. Cystoscopy with injection of indocyanine green dye for sentinel     lymphangiography. 2. Robotic pelvic exenteration with radical cystectomy, bilateral     pelvic lymphadenectomy, hysterectomy, and bilateral salpingo-     oophorectomy. 3. Laparoscopic extensive adhesiolysis. 4. Ileal conduit urinary diversion.  ASSISTANT:  Clemetine Marker, PA.  ESTIMATED BLOOD LOSS:  100 mL.  COMPLICATIONS:  None.  SPECIMENS: 1. Bilateral distal ureteral margins and frozen section negative for     carcinoma. 2. Pelvic exenteration specimen (bladder, uterus, bilateral ovaries     and tubes, cervix, anterior vaginal wall for permanent pathology). 3. Right external iliac lymph nodes. 4. Right obturator lymph nodes. 5. Right common iliac lymph nodes. 6. Right internal iliac lymph nodes. 7. Aortic bifurcation lymph node. 8. Left common iliac lymph node, sentinel. 9. Left external iliac lymph node, sentinel. 10.Left internal iliac lymph nodes. 11.Left obturator lymph nodes.  FINDINGS: 1. Multifocal papillary bladder tumor with a near-complete     obliteration of the left ureteral orifice consistent with known     left malignant obstruction. 2. Mild left hydronephrosis to the level of the urinary bladder     consistent with left malignant obstruction. 3. Hyperfluorescent lymph nodes within the left external iliac and     left common iliac lymph node groups respectively with     lymphangiography. 4. Extensive adhesions between the cecum, anterior abdominal wall and     lateral wall in the area of the appendix,  worrisome for probable     appendicitis in the distant past.  This necessitated approximately     an hour of extensive adhesiolysis laparoscopically.  DRAINS: 1. Jackson-Pratt drain to bulb suction. 2. Right lower quadrant ileal conduit urostomy with Bander stents in     situ.  Right is red and left is blue. 3. On-Q Pain catheter into the extraction site in the midline with infusion of Marcaine.  INDICATION:  Victoria Patrick in an incredibly spry 81 year old lady with minimal medical comorbidities, who was found on workup gross hematuria to have multifocal large volume high-grade bladder cancer.  There was associated left malignant hydronephrosis with this as well.  This was not amenable to complete endoscopic resection and was clinically stage T3.  Additional staging imaging revealed no obvious distant disease or locally advanced disease.  Options were discussed with the patient including chemoradiation versus palliative only protocols versus curative intent, cystectomy with various types of urinary diversion with and without minimally invasive assistance.  The patient wished to proceed with robotic cystectomy with ileal conduit urinary diversion. Informed consent was obtained and placed in the medical record. She was admitted yesterday for bowel prep which she completed to clear.   DESCRIPTION OF PROCEDURE:  The patient being Victoria Patrick was verified. Procedure  being robotic cystectomy was confirmed.  Procedure was carried out.  Time-out was performed.  Intravenous antibiotics were administered.  The patient has already had a bowel prep done preoperatively.  She was placed into a low lithotomy position, taking her arms at her side, placing on a pink non-skid pad.  A test of steep Trendelenburg positioning was performed and she was found to be suitably positioned.  Initial sterile field was created by prepping and draping the patient's vagina, introitus, and proximal thighs using iodine  x3. Next, cystourethroscopy was performed using 22-French rigid cystoscope with 12-degree offset lens.  Inspection of the urinary bladder revealed multifocal papillary bladder tumor, most concentrated in the left wall, this did completely obliterate the left ureteral orifice.  Next, a 2 mL of indocyanine green dye was injected into the mucosal blebs around the areas of papillary tumor and the area of left ureteral orifice.  The cystoscope was exchanged for a 16-French Foley catheter per urethra to straight drain.    Additional sterile field was created by re-prepping and draping the patient's entire vagina, introitus, and proximal thighs and her infra-xiphoid abdomen using chlorhexidine gluconate and iodine respectively.  Next, a high-flow, low-pressure pneumoperitoneum was obtained using Veress technique in the supraumbilical midline having passed the aspiration and drop test.  A robotic camera port was placed in the same location.  Laparoscopic examination of the peritoneal cavity revealed significant adhesions in the area of the cecum and anterior and lateral abdominal wall; however, there was a suitable window to pelvis. So, additional ports were then placed under laparoscopic vision including right paramedian 15 mm assistant port, right far lateral 12-mm AirSeal port, right paramedian 8-mm robotic port, left paramedian 8-mm robotic port, left far lateral 8-mm robotic port.  Robot was docked and passed through the electronic checks.  Next, attention was directed to adhesiolysis.  Very carefully adhesiolysis was performed of the area of the cecum dissecting it multiple thin adhesions away from the anterior and lateral abdominal wall.  The appendix was visualized throughout its course and not obviously acutely inflamed whatsoever.  Otherwise, this especially likely consistent with prior appendicitis in the distant past.  The retroperitoneum was further developed lateral to  the ascending colon superiorly in the area of the right iliac vessels.  The right ureter was encountered, it was marked with vessel loop and then dissected proximally at a distance of approximately 6 cm above the right ureter and then distally just area of the ureterovesical junction where it was doubly clipped and ligated.  The proximal end being tagged with a suture.  Frozen section was sent for Pathology and found to be negative for carcinoma.  Incision was made lateral to the right medial umbilical ligament, sweeping the right bladder away from the pelvic sidewall. Gonadal vessels were also encountered, doubly clipped and ligated.  The uterus, ovaries, and tubes were gently swept medially.  This allowed inspection of the right pelvic lymph nodes fields.  This was inspected under the near-infrared fluorescence light and no obvious sentinel lymph nodes were seen on the right side.  There were several lymphatic channels and sentinel lymph nodes noted on the left, which were addressed later.  As such, template lymphadenectomy was performed on the right side.  First, the right obturator lymph node group, right external iliac lymph node group, right common iliac lymph node group, and lymph nodes at the aortic bifurcation were resected.  Lymphostasis was achieved with cold clips and these packets were set aside individually for  permanent pathology.  Similarly, incision was made lateral to the descending colon, which was carefully rolled medially allowing exposure to the retroperitoneum and the ureter was encountered as it coursed over the iliac arteries on the left.  The left ureter and large caliber as anticipated given the hydronephrosis, this marked vessel the loop, dissected distally toward the area of the ureterovesical junction, was doubly clipped and ligated and marked with tagging suture and frozen section negative for carcinoma.  This dissected also proximally for distance  approximately 6 cm above the iliac crossing.  The gonadal vessels were encountered, doubly clipped and ligated, and the left lateral bladder was swept away from the pelvic sidewall.  This exposed the left pelvic lymph node fields, which were inspected under near- infrared fluorescence light.  Near-infrared fluorescence lymphangiography revealed several sentinel lymph node channels and nodes that concentrated in the left external iliac and left common iliac lymph node groups respectively.  As such, template lymphadenectomy was performed at the left obturator lymph node group, left external iliac lymph node group, left common iliac lymph node group and left internal iliac lymph node group with the external and common groups being noted as a sentinel.  Lymphostasis was achieved with cold clips.  Exposure allowed excellent visualization of the left ureter via the right retroperitoneum and it was passed underneath the colonic mesentery, retroperitoneal location, which yielded excellent apposition to the right ureter, which was also marked for later diversion.  Posterior peritoneal flap was developed very close to the vaginal apex and the vaginal apex was entered posteriorly, thus developing the uterine and bladder pedicles bilaterally.  These were controlled using sequential clipping technique with vascular load stapler towards the area of the bladder neck.  Anterior attachments were then taken down for development of space of Retzius towards the area of the dorsal vein of the clitoris.  This exposed and controlled using vascular load stapler.  The remnants of the urethra were then coldly transected anteriorly completely resecting the urethral meatus and connecting the vaginal incision, which completely freed up the pelvic exenteration specimen.  This was placed in extra-large EndoCatch bag for later retrieval.  Next, vaginal reconstruction was performed by closing the vagina in a clamshell  fashion with the posterior wall apex, sweeping anteriorly and reapproximated using 2-0 V-Loc suture in two layers. Following these maneuvers, hemostasis appeared excellent.  All sponge and needle counts were correct.  Vaginal cuff was closed appropriately. Both ureters were marked and location in the right hemipelvis, tagged with a self-locking grasper.  Robot was then undocked.  The patient has taken out of steep Trendelenburg positioning.  The previous robotic port site was extended inferiorly for total distance of approximately 7 cm. An Omni-Tract retractor was deployed.  Specimen was retrieved and set aside for permanent pathology.  Attention was directed to urinary diversion.  A segment of terminal ileum 15 cm in length, 15 cm proximal to the ileocecal valve keeping up the continuity using bowel load stapler taking great care to avoid devascularization of the conduit or anastomotic segments, which did not occur.  Further development of mesentery of this was performed using white load vascular stapler and taking great care to avoid devascularization of the segments visualizing the mesenteric vessels directly.  The conduit segment was marked distally leaving the extraperitoneal location and was brought back in continuity using bowel load stapler x2 on the antimesenteric border. The acute angle was bolstered with a single silk suture and the free-end was oversewn using running silk  x2.  Mesenteric defect was closed using interrupted silk.  The anastomotic segments were palpably opened up the anastomosis.  All segments were appeared to be appropriately vascular. The proximal conduit staple line was just closed using running Vicryl. The ureteroileal anastomosis was performed first of the right ureter, first laying for imbricating sutures on the down mucosa, placing a single feel stitch of 4-0 Vicryl followed by two separate running suture lines of 4-0 Vicryl.  The red-colored Bander stent  was placed at 23 cm to the anastomosis on the right and incision was closed using a through- and-through chromic suture.  Similarly, left ureteroileal anastomosis was performed by a placing a blue-colored Bander stent 26 cm to the anastomosis, but otherwise in a mirror-image fashion to the right.  The fascia of the conduit site, which was used for the 50-mm paramedian assistant port site was further developed, accommodated a two surgeons fingers, quarter shape colon.  The skin and subcutaneous fat were taken out of this location and the distal part of the segment was brought through this, which was anchored to the level of the fascia x4 using interrupted Vicryl and then a rosebud-type maturation was performed using 2-0 and 3-0 Vicryl.  The omentum was brought down across the extraction site.  Closed suction drain was brought through the previous left lateral most robotic port site through the peritoneal cavity.  A 9- inch On-Q type pain catheter was placed onto the extraction site fascia after was reapproximated using figure-of-eight PDS x6.  Scarpa was reapproximated using running Vicryl at this location.  All incision sites other then the extraction site were infiltrated with dilute Marcaine and closed at the level of the skin using subcuticular Monocryl followed by Dermabond.  Procedure was then terminated.  The patient tolerated the procedure well.  There were no immediate periprocedural complications.  The patient was taken to the postanesthesia care unit in stable condition.          ______________________________ Alexis Frock, MD     TM/MEDQ  D:  06/26/2014  T:  06/27/2014  Job:  [id number]

## 2014-06-27 NOTE — Progress Notes (Signed)
Looks great Urine output good JP re 100 cc per shift Labs Ok CR 1.13 Repeat in am Abdomen benign Floor today

## 2014-06-27 NOTE — Progress Notes (Signed)
Patient out of bed in the recliner chair.Had used Incentive spirometer up to 1043ml, comfortable at this time. Will continue to monitor the patient.

## 2014-06-28 LAB — CBC
HCT: 34.6 % — ABNORMAL LOW (ref 36.0–46.0)
HEMOGLOBIN: 11.2 g/dL — AB (ref 12.0–15.0)
MCH: 30.8 pg (ref 26.0–34.0)
MCHC: 32.4 g/dL (ref 30.0–36.0)
MCV: 95.1 fL (ref 78.0–100.0)
Platelets: ADEQUATE 10*3/uL (ref 150–400)
RBC: 3.64 MIL/uL — ABNORMAL LOW (ref 3.87–5.11)
RDW: 13.2 % (ref 11.5–15.5)
WBC: 10.6 10*3/uL — AB (ref 4.0–10.5)

## 2014-06-28 LAB — BASIC METABOLIC PANEL WITH GFR
Anion gap: 6 (ref 5–15)
BUN: 14 mg/dL (ref 6–20)
CO2: 28 mmol/L (ref 22–32)
Calcium: 8.7 mg/dL — ABNORMAL LOW (ref 8.9–10.3)
Chloride: 101 mmol/L (ref 101–111)
Creatinine, Ser: 1.1 mg/dL — ABNORMAL HIGH (ref 0.44–1.00)
GFR calc Af Amer: 50 mL/min — ABNORMAL LOW
GFR calc non Af Amer: 43 mL/min — ABNORMAL LOW
Glucose, Bld: 132 mg/dL — ABNORMAL HIGH (ref 65–99)
Potassium: 4.3 mmol/L (ref 3.5–5.1)
Sodium: 135 mmol/L (ref 135–145)

## 2014-06-28 NOTE — Evaluation (Signed)
Physical Therapy Evaluation Patient Details Name: Victoria Patrick MRN: [id number] DOB: 03/15/1922 Today's Date: 06/28/2014   History of Present Illness  This 81 year old female was admitted for bladder CA.  She is s/p Robotic pelvic exenteration with radical cystectomy, bilateral  PMH significant for HTN and DM  Clinical Impression  Pt admitted with above diagnosis. Pt currently with functional limitations due to the deficits listed below (see PT Problem List). Pt will benefit from skilled PT to increase their independence and safety with mobility to allow discharge to the venue listed below.   Pt lives alone, is incr fall risk, recommend SNF at this time but will follow for needs, D/C needs may depend on LOS Pt reports she was glad she walked and felt better afterwards;      Follow Up Recommendations SNF    Equipment Recommendations  None recommended by PT    Recommendations for Other Services       Precautions / Restrictions Precautions Precautions: Fall Precaution Comments: multiple lines on 2 IV poles (?why), JP drain      Mobility  Bed Mobility Overal bed mobility: Needs Assistance Bed Mobility: Sidelying to Sit;Sit to Supine   Sidelying to sit: Min assist Supine to sit: Mod assist     General bed mobility comments: HOB raised, assisted with trunk and utilized pad to bring her forward to EOB; cues to self assist  Transfers Overall transfer level: Needs assistance Equipment used: Rolling walker (2 wheeled) Transfers: Sit to/from Stand Sit to Stand: Min assist         General transfer comment: min A to rise and steady.  cues for overall safety  Ambulation/Gait Ambulation/Gait assistance: Min assist Ambulation Distance (Feet): 140 Feet Assistive device: Rolling walker (2 wheeled) Gait Pattern/deviations: Step-through pattern;Decreased stride length;Trunk flexed     General Gait Details: cues for RW safety and position from self; pt reported dizziness initally  but this subsided and pt wanted to continue amb; assist for balance and to maneuver RW  Stairs            Wheelchair Mobility    Modified Rankin (Stroke Patients Only)       Balance                                             Pertinent Vitals/Pain Pain Assessment: Faces Faces Pain Scale: Hurts little more Pain Location: abd Pain Descriptors / Indicators: Sore Pain Intervention(s): Limited activity within patient's tolerance;Monitored during session;Repositioned;PCA encouraged (pt declined PCA)    Home Living Family/patient expects to be discharged to:: Unsure Living Arrangements: Alone                    Prior Function Level of Independence: Independent; pt enjoys working in her yard               [employer]        Extremity/Trunk Assessment   Upper Extremity Assessment: Defer to OT evaluation;Overall WFL for tasks assessed           Lower Extremity Assessment: Overall WFL for tasks assessed         Communication   Communication: No difficulties  Cognition Arousal/Alertness: Awake/alert Behavior During Therapy: Impulsive   Area of Impairment: Safety/judgement     Memory: Decreased short-term memory (?)   Safety/Judgement: Decreased awareness of deficits;Decreased awareness of safety  General Comments: pt mildly impulsive, standing without  assist and with initial LOB requiring min to recover while PT/tech were managaing lines; pt is very independnet at her baseline; pt repeatedly asked or stated that PT was there yesterday;     General Comments      Exercises        Assessment/Plan    PT Assessment Patient needs continued PT services  PT Diagnosis Difficulty walking   PT Problem List Decreased activity tolerance;Decreased mobility;Decreased balance;Decreased safety awareness  PT Treatment Interventions DME instruction;Gait training;Functional mobility training;Therapeutic activities;Therapeutic  exercise;Patient/family education   PT Goals (Current goals can be found in the Care Plan section) Acute Rehab PT Goals Patient Stated Goal: get back to being independent PT Goal Formulation: With patient Time For Goal Achievement: 07/12/14 Potential to Achieve Goals: Good    Frequency Min 3X/week   Barriers to discharge        Co-evaluation               End of Session Equipment Utilized During Treatment: Gait belt Activity Tolerance: Patient tolerated treatment well Patient left: with call bell/phone within reach Nurse Communication: Mobility status         Time: 9417-4081 PT Time Calculation (min) (ACUTE ONLY): 28 min   Charges:   PT Evaluation $Initial PT Evaluation Tier I: 1 Procedure PT Treatments $Gait Training: 8-22 mins   PT G Codes:        Lynessa Almanzar July 20, 2014, 3:21 PM

## 2014-06-28 NOTE — Progress Notes (Signed)
Looks great Drain 400 yesterday Pass gas today Labs normal Clear fluids

## 2014-06-28 NOTE — Progress Notes (Signed)
Pt sitting up in chair for 1 hr, requests to go back to bed states she is tired. Ambulated with PT down the hall, tol. Well. States she's feeling better. Lt eye swelling decreasing. Still has crepitus in Lt hand

## 2014-06-28 NOTE — Plan of Care (Signed)
Problem: Phase I Progression Outcomes Goal: OOB as tolerated unless otherwise ordered Outcome: Progressing To recliner for 4 hours 6/18

## 2014-06-28 NOTE — Evaluation (Signed)
Occupational Therapy Evaluation Patient Details Name: Victoria Patrick MRN: [id number] DOB: Nov 21, 1922 Today's Date: 06/28/2014    History of Present Illness This 81 year old female was admitted for bladder CA.  She is s/p Robotic pelvic exenteration with radical cystectomy, bilateral  PMH significant for HTN and DM   Clinical Impression   Pt was admitted for the above. At baseline, she is very independent and lives alone.  She currently needs min A for SPT and up to total A for LB adls.  She will benefit from skilled OT in acute.  Goals are for supervision to min A levels    Follow Up Recommendations  SNF    Equipment Recommendations  3 in 1 bedside comode    Recommendations for Other Services       Precautions / Restrictions Precautions Precautions: Fall Precaution Comments: multiple lines, on Q pain pump, JP drain,  Restrictions Weight Bearing Restrictions: No      Mobility Bed Mobility Overal bed mobility: Needs Assistance Bed Mobility: Supine to Sit     Supine to sit: Mod assist     General bed mobility comments: HOB raised, assisted with trunk and utilized pad to bring her forward to EOB  Transfers Overall transfer level: Needs assistance Equipment used:  (hand held assistance) Transfers: Sit to/from [facility] Sit to Stand: Min assist Stand pivot transfers: Min assist       General transfer comment: min A to rise and steady.  Therapist in front of pt for SPT to chair    Balance                                            ADL Overall ADL's : Needs assistance/impaired     Grooming: Set up;Wash/dry hands;Wash/dry face;Brushing hair;Sitting   Upper Body Bathing: Minimal assitance;Sitting   Lower Body Bathing: Maximal assistance;Sit to/from stand   Upper Body Dressing : Minimal assistance;Sitting   Lower Body Dressing: Total assistance;Sit to/from stand   Toilet Transfer: Minimal assistance;Stand-pivot (to chair  from elevated bed)             General ADL Comments: Pt's gown wet from dressing:  assisted her in changing after RN placed new dressing.  Pt needs min A for UB adls due to multiple lines.  She does not have a reacher, but this would be helpful with LB adls.  Will plan to introduce this on a future visit.     Vision     Perception     Praxis      Pertinent Vitals/Pain Pain Assessment: 0-10 Pain Score: 4  Pain Location: abdomen Pain Descriptors / Indicators: Sore Pain Intervention(s): Limited activity within patient's tolerance;Monitored during session;Repositioned;PCA encouraged     Hand Dominance Right   Extremity/Trunk Assessment Upper Extremity Assessment Upper Extremity Assessment: Overall WFL for tasks assessed           Communication Communication Communication: No difficulties   Cognition Arousal/Alertness: Awake/alert Behavior During Therapy: WFL for tasks assessed/performed Overall Cognitive Status: Within Functional Limits for tasks assessed                     General Comments       Exercises       Shoulder Instructions      Home Living Family/patient expects to be discharged to:: Unsure Living Arrangements: Alone  Prior Functioning/Environment Level of Independence: Independent             OT Diagnosis: Generalized weakness;Acute pain   OT Problem List: Decreased strength;Decreased activity tolerance;Decreased knowledge of use of DME or AE;Pain   OT Treatment/Interventions: Self-care/ADL training;DME and/or AE instruction;Patient/family education;Balance training    OT Goals(Current goals can be found in the care plan section) Acute Rehab OT Goals Patient Stated Goal: get back to being independent OT Goal Formulation: With patient Time For Goal Achievement: 07/12/14 Potential to Achieve Goals: Good ADL Goals Pt Will Perform Grooming: with supervision;standing Pt Will  Perform Upper Body Bathing: with set-up;sitting Pt Will Perform Lower Body Bathing: with min assist;with adaptive equipment;sit to/from stand Pt Will Perform Lower Body Dressing: with min assist;with adaptive equipment;sit to/from stand Pt Will Transfer to Toilet: with min guard assist;ambulating;bedside commode Pt Will Perform Toileting - Clothing Manipulation and hygiene: with supervision;sit to/from stand  OT Frequency: Min 2X/week   Barriers to D/C:            Co-evaluation              End of Session    Activity Tolerance: Patient tolerated treatment well Patient left: in chair;with call bell/phone within reach;with chair alarm set   Time: 0932-6712 OT Time Calculation (min): 28 min Charges:  OT General Charges $OT Visit: 1 Procedure OT Evaluation $Initial OT Evaluation Tier I: 1 Procedure OT Treatments $Self Care/Home Management : 8-22 mins G-Codes:    Bravlio Luca 07-Jul-2014, 11:29 AM   Lesle Chris, OTR/L [phone] 2014/07/07

## 2014-06-29 ENCOUNTER — Encounter (HOSPITAL_COMMUNITY): Payer: Self-pay | Admitting: Urology

## 2014-06-29 LAB — TYPE AND SCREEN
ABO/RH(D): O NEG
ANTIBODY SCREEN: NEGATIVE
UNIT DIVISION: 0
Unit division: 0

## 2014-06-29 LAB — BASIC METABOLIC PANEL
ANION GAP: 4 — AB (ref 5–15)
BUN: 7 mg/dL (ref 6–20)
CALCIUM: 8.8 mg/dL — AB (ref 8.9–10.3)
CO2: 29 mmol/L (ref 22–32)
Chloride: 103 mmol/L (ref 101–111)
Creatinine, Ser: 0.84 mg/dL (ref 0.44–1.00)
GFR calc Af Amer: >60 mL/min (ref >60)
GFR calc non Af Amer: 60 mL/min — ABNORMAL LOW (ref >60)
Glucose, Bld: 132 mg/dL — ABNORMAL HIGH (ref 65–99)
Potassium: 4.2 mmol/L (ref 3.5–5.1)
SODIUM: 136 mmol/L (ref 135–145)

## 2014-06-29 LAB — CBC
HEMATOCRIT: 37.7 % (ref 36.0–46.0)
Hemoglobin: 12.4 g/dL (ref 12.0–15.0)
MCH: 31 pg (ref 26.0–34.0)
MCHC: 32.9 g/dL (ref 30.0–36.0)
MCV: 94.3 fL (ref 78.0–100.0)
Platelets: UNDETERMINED 10*3/uL (ref 150–400)
RBC: 4 MIL/uL (ref 3.87–5.11)
RDW: 13 % (ref 11.5–15.5)
WBC: 8 10*3/uL (ref 4.0–10.5)

## 2014-06-29 LAB — CREATININE, FLUID (PLEURAL, PERITONEAL, JP DRAINAGE): CREAT FL: 0.7 mg/dL

## 2014-06-29 NOTE — Clinical Social Work Note (Addendum)
Clinical Social Work Assessment  Patient Details  Name: Victoria Patrick MRN: [id number] Date of Birth: 18-Mar-1922  Date of referral:  06/29/14               Reason for consult:  Discharge Planning                Permission sought to share information with:    Permission granted to share information::  No  Name::        Agency::     Relationship::     Contact Information:     Housing/Transportation Living arrangements for the past 2 months:  Single Family Home Source of Information:  Patient Patient Interpreter Needed:  None Criminal Activity/Legal Involvement Pertinent to Current Situation/Hospitalization:  No - Comment as needed Significant Relationships:  Adult Children Lives with:  Self Do you feel safe going back to the place where you live?  No Need for family participation in patient care:  No (Coment)  Care giving concerns:  Pt lives alone. S/P surgery. Will need rehab at SNF prior to returning home.   Social Worker assessment / plan:  CSW received referral for new SNF.   CSW met with pt at bedside. CSW introduced self and explained role. Pt states that she lives alone. CSW discussed recommendations for short term rehab at SNF prior to returning home. Pt expressed that she feels that she will need the rehab at [facility] and agreeable to plan. Pt agreeable to [facility] search.   CSW completed FL2 and initiated SNF search to [facility] facilities.   CSW to follow up with pt regarding SNF bed offers.  CSW to continue to follow to provide support and assist with pt discharge planning needs.   Employment status:  Retired Forensic scientist:  Medicare PT Recommendations:  [place] / Referral to community resources:  [place]  Patient/Family's Response to care:  Pt alert and oriented x 4. Pt pleasant and actively involved in conversation. Pt recognizes need for rehab following hospitalization. Pt eager to receive bed  offers in order to make a decision regarding SNF.   Patient/Family's Understanding of and Emotional Response to Diagnosis, Current Treatment, and Prognosis:  Pt displays good understanding about diagnosis and treatment plan. Pt anticipated need for rehab at SNF.  Emotional Assessment Appearance:  Appears stated age Attitude/Demeanor/Rapport:  Other (pt appropriate) Affect (typically observed):  Accepting Orientation:  Oriented to Self, Oriented to Place, Oriented to  Time Alcohol / Substance use:  Not Applicable Psych involvement (Current and /or in the community):  No (Comment)  Discharge Needs  Concerns to be addressed:  Discharge Planning Concerns Readmission within the last 30 days:  No Current discharge risk:  Physical Impairment Barriers to Discharge:  Continued Medical Work up   Ladell Pier, LCSW 06/29/2014, 11:59 AM  [phone]

## 2014-06-29 NOTE — Clinical Social Work Placement (Signed)
   CLINICAL SOCIAL WORK PLACEMENT  NOTE  Date:  06/29/2014  Patient Details  Name: [name] MRN: [id number] Date of Birth: July 19, 1922  Clinical Social Work is seeking post-discharge placement for this patient at the [place] level of care (*CSW will initial, date and re-position this form in  chart as items are completed):  Yes   Patient/family provided with [place] Work Department's list of facilities offering this level of care within the geographic area requested by the patient (or if unable, by the patient's family).  Yes   Patient/family informed of their freedom to choose among providers that offer the needed level of care, that participate in Medicare, Medicaid or managed care program needed by the patient, have an available bed and are willing to accept the patient.  Yes   Patient/family informed of [place]'s ownership interest in [facility] and [facility], as well as of the fact that they are under no obligation to receive care at these facilities.  PASRR submitted to EDS on 06/29/14     PASRR number received on 06/29/14     Existing PASRR number confirmed on       FL2 transmitted to all facilities in geographic area requested by pt/family on 06/29/14     FL2 transmitted to all facilities within larger geographic area on       Patient informed that his/her managed care company has contracts with or will negotiate with certain facilities, including the following:            Patient/family informed of bed offers received.  Patient chooses bed at       Physician recommends and patient chooses bed at      Patient to be transferred to   on  .  Patient to be transferred to facility by       Patient family notified on   of transfer.  Name of family member notified:        PHYSICIAN Please sign FL2     Additional Comment:    _______________________________________________ Ladell Pier, LCSW 06/29/2014, 12:04  PM

## 2014-06-29 NOTE — Consult Note (Signed)
WOC ostomy consult note Stoma type/location: RMQ urostomy. Flat pouching system applied post-op has barrier wasting and is about to leak in the area of her abdominal creases. Stomal assessment/size: Slightly larger than 1 and 1/8 inch; convex urostomy pouch system cut to 1 and 1/4 inch and skin barrier ring used to cover and protect the immediate parastomal skin. Two stents intact: red = right ureter, Blue = left ureter. Peristomal assessment: intact. Creases noted in parastomal plane, but they are mild. Treatment options for stomal/peristomal skin: Skin barrier ring Output: blood-tinged urine in tubing, clear in new pouch  Ostomy pouching: 1pc. convex urostomy pouch with skin barrier ring  Education provided: Patient is somewhat shaky this morning.  Conversational and polite, but repeating each teaching point slowly and shaking her head as if the information is hard to take in.  Understands that pouch will need to be changed twice weekly and emptied several times each day.  Taught that she will attach to the night-time drainage bag at night so that she can sleep uninterrupted.  Taught today that stoma has no feeling, but that skin around stoma does and that itching or burning beneath pouch is an indication that pouch needs changing.  Taught that two stents are temporary and that they will fall out on there own in a couple of weeks.  In the meantime, [facility] services are recommended as pouching is a little bit more complex when aperture is being placed over stents.  If you agree, please order/make arrangements. Enrolled patient in [place] program: Yes [place] nursing team will follow, and will remain available to this patient, the nursing and medical teams.   Thanks, Maudie Flakes, MSN, RN, [place], [place], [place] [phone])

## 2014-06-29 NOTE — Progress Notes (Signed)
Received report that pt had sub Q air in left arm.  Assessed pt and found subQ air in chest, neck, left arm. Bilateral legs, back and right shoulder.  Notified urology on call PA.  No orders received.  Pt not sob, does not appear to be getting larger in size, pt asymptomatic, sats within normal range on room air.  Will continue to monitor. Victoria Patrick P

## 2014-06-29 NOTE — Progress Notes (Signed)
3 Days Post-Op  Subjective:  1 - Bladder Cancer - s/p robotic radical cystectomy with ICG sentinal + pelvic lymphadenectomy + hysterectomy/BSO + ileal conduit urinary diversion 06/26/2014. Admitted 6/16 for pre-op bowel prep and stomal site marking. Observed stepdown overnight post-op and transferred to Oncology floor 6/18. ON-Q pain catheter removed 6/19. Path pending.  2 - Post-op Ileus - s/p bowel anastomosis at surgery above. Started clear liq diet POD 2.  3 - Disposition / Rehab - independent at baseline. PT/OT consult post-op to help with DC planning, tentatively rec SNF at discharge.   Today "Victoria Patrick" is without complaints. No BM / flatus yet, but tollerating clears and feels "rumbling" in belly.   Objective: Vital signs in last 24 hours: Temp:  [98.3 F (36.8 C)-98.6 F (37 C)] 98.3 F (36.8 C) (06/20 0537) Pulse Rate:  [83-103] 83 (06/20 0537) Resp:  [14-20] 16 (06/20 0537) BP: (121-152)/(50-91) 121/91 mmHg (06/20 0537) SpO2:  [98 %-100 %] 100 % (06/20 0537) Last BM Date: 06/25/14  Intake/Output from previous day: 06/19 0701 - 06/20 0700 In: 3668.3 [P.O.:1160; I.V.:2503.3] Out: 5005 [Urine:4100; Drains:905] Intake/Output this shift: Total I/O In: -  Out: 52 [Drains:70]  General appearance: alert, cooperative and vigorous for age Eyes: negative Nose: Nares normal. Septum midline. Mucosa normal. No drainage or sinus tenderness. Throat: lips, mucosa, and tongue normal; teeth and gums normal Neck: supple, symmetrical, trachea midline Back: symmetric, no curvature. ROM normal. No CVA tenderness. Resp: non-labored on [place] O2 / CO2 monitor,. Cardio: Nl rate GI: soft, non-tender; bowel sounds normal; no masses,  no organomegaly Pelvic: external genitalia normal and scant vaginal discharge. Extremities: extremities normal, atraumatic, no cyanosis or edema Pulses: 2+ and symmetric Lymph nodes: Cervical, supraclavicular, and axillary nodes normal. Neurologic: Grossly  normal Incision/Wound: RLQ urostomy pink / patent with red and blue bander stents in situ. All incision sites c/d/i. JP serous.   Lab Results:   Recent Labs  06/28/14 0408 06/29/14 0411  WBC 10.6* 8.0  HGB 11.2* 12.4  HCT 34.6* 37.7  PLT PLATELET CLUMPS NOTED ON SMEAR, COUNT APPEARS ADEQUATE PLATELET CLUMPS NOTED ON SMEAR, UNABLE TO ESTIMATE   BMET  Recent Labs  06/28/14 0408 06/29/14 0411  NA 135 136  K 4.3 4.2  CL 101 103  CO2 28 29  GLUCOSE 132* 132*  BUN 14 7  CREATININE 1.10* 0.84  CALCIUM 8.7* 8.8*   PT/INR No results for input(s): LABPROT, INR in the last 72 hours. ABG No results for input(s): PHART, HCO3 in the last 72 hours.  Invalid input(s): PCO2, PO2  Studies/Results: No results found.  Anti-infectives: Anti-infectives    Start     Dose/Rate Route Frequency Ordered Stop   06/26/14 1600  piperacillin-tazobactam (ZOSYN) IVPB 3.375 g     3.375 g 12.5 mL/hr over 240 Minutes Intravenous Every 8 hours 06/26/14 1511 06/28/14 1219   06/26/14 0000  [MAR Hold]  piperacillin-tazobactam (ZOSYN) IVPB 3.375 g     (MAR Hold since 06/26/14 0636)   3.375 g 100 mL/hr over 30 Minutes Intravenous 30 min pre-op 06/25/14 1315 06/26/14 0734      Assessment/Plan:  1 - Bladder Cancer - doing very well POD 3. Decrease IVF, check JP Cr. Remain in house.   2 - Post-op Ileus - continue clears until flatus / BM, then advance.   3 - Disposition / Rehab - appreciate PT/OT and ostomy RN help.     [facility], Victoria Patrick 06/29/2014

## 2014-06-29 NOTE — Progress Notes (Signed)
Pt with increased drainage in JP drain after midnight, see I&O tab for totals. Drainage looks very similar to the urine in the drainage bag connected to the urostomy. Pt having good urine output from urostomy as well. Urine becoming less red as compared to previous night shift. Pt resting well with no complaints of pain. Continue to monitor. Hortencia Conradi RN

## 2014-06-30 LAB — BASIC METABOLIC PANEL
Anion gap: 7 (ref 5–15)
BUN: 8 mg/dL (ref 6–20)
CO2: 28 mmol/L (ref 22–32)
Calcium: 8.6 mg/dL — ABNORMAL LOW (ref 8.9–10.3)
Chloride: 98 mmol/L — ABNORMAL LOW (ref 101–111)
Creatinine, Ser: 0.81 mg/dL (ref 0.44–1.00)
GFR calc non Af Amer: >60 mL/min (ref >60)
Glucose, Bld: 154 mg/dL — ABNORMAL HIGH (ref 65–99)
Potassium: 4 mmol/L (ref 3.5–5.1)
Sodium: 133 mmol/L — ABNORMAL LOW (ref 135–145)

## 2014-06-30 LAB — CBC
HEMATOCRIT: 40.2 % (ref 36.0–46.0)
HEMOGLOBIN: 14 g/dL (ref 12.0–15.0)
MCH: 31.1 pg (ref 26.0–34.0)
MCHC: 34.8 g/dL (ref 30.0–36.0)
MCV: 89.3 fL (ref 78.0–100.0)
PLATELETS: UNDETERMINED 10*3/uL (ref 150–400)
RBC: 4.5 MIL/uL (ref 3.87–5.11)
RDW: 12.4 % (ref 11.5–15.5)
WBC: 10.2 10*3/uL (ref 4.0–10.5)

## 2014-06-30 MED ORDER — TRAMADOL HCL 50 MG PO TABS: 50.0000 mg | ORAL_TABLET | Freq: Four times a day (QID) | ORAL | Status: DC | PRN

## 2014-06-30 MED ORDER — HYDROMORPHONE HCL 1 MG/ML IJ SOLN: 0.5000 mg | INTRAMUSCULAR | Status: DC | PRN

## 2014-06-30 NOTE — Progress Notes (Signed)
4 Days Post-Op  Subjective:  1 - Bladder Cancer - s/p robotic radical cystectomy with ICG sentinal + pelvic lymphadenectomy + hysterectomy/BSO + ileal conduit urinary diversion 06/26/2014. Admitted 6/16 for pre-op bowel prep and stomal site marking. Observed stepdown overnight post-op and transferred to Oncology floor 6/18. ON-Q pain catheter removed 6/19. JP removed 6/21 as JP Cr same as serum. Path pending.  2 - Post-op Ileus - s/p bowel anastomosis at surgery above. Started clear liq diet POD 2. Resumed BM POD 3 and transitioned to reg diet.  3 - Disposition / Rehab - independent at baseline. PT/OT consult post-op to help with DC planning, rec SNF at discharge and pt amenable.  Today "Victoria Patrick" is without complaints. BM x 2 yesterday. Doing well with ambulation. In process of selecting SNF.   Objective: Vital signs in last 24 hours: Temp:  [98.1 F (36.7 C)-98.6 F (37 C)] 98.1 F (36.7 C) (06/21 0600) Pulse Rate:  [99-105] 105 (06/21 0600) Resp:  [16-20] 20 (06/21 0600) BP: (118-133)/(64-81) 133/67 mmHg (06/21 0600) SpO2:  [95 %-98 %] 97 % (06/21 0600) FiO2 (%):  [96 %] 96 % (06/20 1621) Last BM Date: 06/29/14  Intake/Output from previous day: 06/20 0701 - 06/21 0700 In: 1064.2 [P.O.:360; I.V.:704.2] Out: 2520 [Urine:1300; Drains:1220] Intake/Output this shift:    General appearance: alert, cooperative and appears stated age Head: Normocephalic, without obvious abnormality, atraumatic Eyes: negative Nose: Nares normal. Septum midline. Mucosa normal. No drainage or sinus tenderness. Throat: lips, mucosa, and tongue normal; teeth and gums normal Neck: supple, symmetrical, trachea midline Back: symmetric, no curvature. ROM normal. No CVA tenderness. Resp: non-labored on room air Cardio: mild regular tachycardia GI: soft, non-tender; bowel sounds normal; no masses,  no organomegaly and resolving subQ emphysema (from laparoscopic surgery) Pelvic: external genitalia normal and  scant serous vaginal drainge as expected. Extremities: extremities normal, atraumatic, no cyanosis or edema Skin: Skin color, texture, turgor normal. No rashes or lesions Neurologic: Grossly normal Incision/Wound: RLQ Urostomy pink / patent with dark yellow urine. JP removed and dry dressing applied.   Lab Results:   Recent Labs  06/29/14 0411 06/30/14 0418  WBC 8.0 10.2  HGB 12.4 14.0  HCT 37.7 40.2  PLT PLATELET CLUMPS NOTED ON SMEAR, UNABLE TO ESTIMATE PLATELET CLUMPS NOTED ON SMEAR, UNABLE TO ESTIMATE   BMET  Recent Labs  06/29/14 0411 06/30/14 0418  NA 136 133*  K 4.2 4.0  CL 103 98*  CO2 29 28  GLUCOSE 132* 154*  BUN 7 8  CREATININE 0.84 0.81  CALCIUM 8.8* 8.6*   PT/INR No results for input(s): LABPROT, INR in the last 72 hours. ABG No results for input(s): PHART, HCO3 in the last 72 hours.  Invalid input(s): PCO2, PO2  Studies/Results: No results found.  Anti-infectives: Anti-infectives    Start     Dose/Rate Route Frequency Ordered Stop   06/26/14 1600  piperacillin-tazobactam (ZOSYN) IVPB 3.375 g     3.375 g 12.5 mL/hr over 240 Minutes Intravenous Every 8 hours 06/26/14 1511 06/28/14 1219   06/26/14 0000  [MAR Hold]  piperacillin-tazobactam (ZOSYN) IVPB 3.375 g     (MAR Hold since 06/26/14 0636)   3.375 g 100 mL/hr over 30 Minutes Intravenous 30 min pre-op 06/25/14 1315 06/26/14 0734      Assessment/Plan:  1 - Bladder Cancer - doing very well POD 4. Saline lock IV, transition to PO meds.   2 - Post-op Ileus - resolved clinically. Adv to reg diet today.   3 -  Disposition / Rehab - appreciate PT/OT and ostomy RN help, likely ready for DC to SNF Thursday based on current progress.   [facility], Victoria Patrick 06/30/2014

## 2014-06-30 NOTE — Clinical Social Work Placement (Signed)
   CLINICAL SOCIAL WORK PLACEMENT  NOTE  Date:  06/30/2014  Patient Details  Name: Victoria Patrick MRN: [id number] Date of Birth: 04/16/1922  Clinical Social Work is seeking post-discharge placement for this patient at the [place] level of care (*CSW will initial, date and re-position this form in  chart as items are completed):  Yes   Patient/family provided with [place] Work Department's list of facilities offering this level of care within the geographic area requested by the patient (or if unable, by the patient's family).  Yes   Patient/family informed of their freedom to choose among providers that offer the needed level of care, that participate in Medicare, Medicaid or managed care program needed by the patient, have an available bed and are willing to accept the patient.  Yes   Patient/family informed of [facility]'s ownership interest in [facility] and [facility], as well as of the fact that they are under no obligation to receive care at these facilities.  PASRR submitted to EDS on 06/29/14     PASRR number received on 06/29/14     Existing PASRR number confirmed on       FL2 transmitted to all facilities in geographic area requested by pt/family on 06/29/14     FL2 transmitted to all facilities within larger geographic area on       Patient informed that his/her managed care company has contracts with or will negotiate with certain facilities, including the following:        Yes   Patient/family informed of bed offers received.  Patient chooses bed at [facility]     Physician recommends and patient chooses bed at      Patient to be transferred to   on  .  Patient to be transferred to facility by       Patient family notified on   of transfer.  Name of family member notified:        PHYSICIAN Please sign FL2     Additional Comment:    _______________________________________________ Ladell Pier,  LCSW 06/30/2014, 3:52 PM

## 2014-06-30 NOTE — Clinical Social Work Placement (Signed)
   CLINICAL SOCIAL WORK PLACEMENT  NOTE  Date:  06/30/2014  Patient Details  Name: Victoria Patrick MRN: [id number] Date of Birth: 1922/08/02  Clinical Social Work is seeking post-discharge placement for this patient at the [place] level of care (*CSW will initial, date and re-position this form in  chart as items are completed):  Yes   Patient/family provided with [place] Work Department's list of facilities offering this level of care within the geographic area requested by the patient (or if unable, by the patient's family).  Yes   Patient/family informed of their freedom to choose among providers that offer the needed level of care, that participate in Medicare, Medicaid or managed care program needed by the patient, have an available bed and are willing to accept the patient.  Yes   Patient/family informed of [facility]'s ownership interest in [facility] and [facility], as well as of the fact that they are under no obligation to receive care at these facilities.  PASRR submitted to EDS on 06/29/14     PASRR number received on 06/29/14     Existing PASRR number confirmed on       FL2 transmitted to all facilities in geographic area requested by pt/family on 06/29/14     FL2 transmitted to all facilities within larger geographic area on       Patient informed that his/her managed care company has contracts with or will negotiate with certain facilities, including the following:        Yes   Patient/family informed of bed offers received.  Patient chooses bed at       Physician recommends and patient chooses bed at      Patient to be transferred to   on  .  Patient to be transferred to facility by       Patient family notified on   of transfer.  Name of family member notified:        PHYSICIAN Please sign FL2     Additional Comment:    _______________________________________________ Ladell Pier, LCSW 06/30/2014,  2:08 PM

## 2014-06-30 NOTE — Progress Notes (Addendum)
CSW continuing to follow for disposition planning.   CSW followed up with pt at bedside to provide SNF bed offers that are available at this time. Pt expressed that she is hopeful for [facility] and facility currently has responded considering and wanting clarification regarding if pt will have any treatment for bladder CA? chemo? chemo meds?. Pt encouraged to review other SNF offers as CSW awaits clarification from MD in order to provide to [facility] to get a official response.   CSW contacted Dr. Tresa Moore office and left message with RN regarding the questions [facility] needs clarified. CSW awaiting return phone call.  CSW to continue to follow to provide support and assist with pt disposition needs.   Addendum:   CSW received phone call from Dr. Tresa Moore to clarify questions and per Dr. Tresa Moore pt will not need any type of cancer treatment following hospitalization.  CSW updated [facility] confirmed that facility can offer pt a bed.  CSW met with pt at bedside to notify and pt very pleased that [facility] able to offer and wants to accept bed offer for [facility].  CSW notified [facility] of acceptance of bed offer and [facility] confirmed that they can accept pt when medically stable for discharge.  CSW to continue to follow to provide support and assist with pt discharge to [facility] when medically ready.   Alison Murray, MSW, [place] Work [phone]

## 2014-06-30 NOTE — Progress Notes (Cosign Needed)
Wasted 25 ml of dilaudid pca 0.3 mg/ml

## 2014-06-30 NOTE — Consult Note (Signed)
WOC visited with patient, she has friend at the bedside. Pt reports and per the notes in the chart that she is planning to go to rehab. She currently lives alone with limited support system. Pouch change yesterday, pouch intact. Attempted to instruct patient on emptying and disconnect from BSD, however she reports she "has not been feeling well today" and would like to wait until tomorrow if possible.    WOC will follow along with you, plans to see patient in the am [place], [place]

## 2014-06-30 NOTE — Care Management Note (Signed)
Case Management Note  Patient Details  Name: Victoria Patrick MRN: [id number] Date of Birth: [birth year]  Subjective/Objective:           81 yo admitted with bladder cancer         Action/Plan: From home alone  Expected Discharge Date:   (UNKNOWN)               Expected Discharge Plan:  [place]  In-House Referral:  Clinical Social Work  Discharge planning Services  CM Consult  Post Acute Care Choice:    Choice offered to:     DME Arranged:    DME Agency:     HH Arranged:    [place] Agency:     Status of Service:  In process, will continue to follow  Medicare Important Message Given:  Yes Date Medicare IM Given:  06/30/14 Medicare IM give by:  Marney Doctor RN,BSN,NCM Date Additional Medicare IM Given:    Additional Medicare Important Message give by:     If discussed at [place] of Stay Meetings, dates discussed:    Additional Comments:  Lynnell Catalan, RN 06/30/2014, 2:12 PM

## 2014-06-30 NOTE — Progress Notes (Signed)
Physical Therapy Treatment Patient Details Name: Victoria Patrick MRN: [id number] DOB: June 12, 1922 Today's Date: 06/30/2014    History of Present Illness This 81 year old female was admitted for bladder CA.  She is s/p Robotic pelvic exenteration with radical cystectomy, bilateral  PMH significant for HTN and DM    PT Comments    Progressing with  Mobility. Still feel ST rehab at SNF is best/safety option.  Follow Up Recommendations  SNF     Equipment Recommendations  None recommended by PT    Recommendations for Other Services       Precautions / Restrictions Precautions Precautions: Fall Restrictions Weight Bearing Restrictions: No    Mobility  Bed Mobility   Bed Mobility: Supine to Sit;Sit to Supine     Supine to sit: Min guard Sit to supine: Min guard   General bed mobility comments: close guard for safety  Transfers Overall transfer level: Needs assistance Equipment used: Rolling walker (2 wheeled) Transfers: Sit to/from Stand Sit to Stand: Min assist            Ambulation/Gait Ambulation/Gait assistance: Min assist Ambulation Distance (Feet): 500 Feet Assistive device: 2 person hand held assist Gait Pattern/deviations: Step-through pattern;Decreased stride length     General Gait Details: Assist to stabilize. LOB x1 during first 15 feet of ambulation. No c/o dizziness. Pt tolerated distance well.    Stairs            Wheelchair Mobility    Modified Rankin (Stroke Patients Only)       Balance                                    Cognition Arousal/Alertness: Awake/alert Behavior During Therapy: WFL for tasks assessed/performed Overall Cognitive Status: Within Functional Limits for tasks assessed                      Exercises      General Comments        Pertinent Vitals/Pain Pain Assessment: Faces Faces Pain Scale: Hurts little more Pain Location: abdomen Pain Descriptors / Indicators: Sore    Home  Living                      Prior Function            PT Goals (current goals can now be found in the care plan section) Progress towards PT goals: Progressing toward goals    Frequency  Min 3X/week    PT Plan Current plan remains appropriate    Co-evaluation             End of Session Equipment Utilized During Treatment: Gait belt Activity Tolerance: Patient tolerated treatment well Patient left: in bed;with call bell/phone within reach;with bed alarm set     Time: 6734-1937 PT Time Calculation (min) (ACUTE ONLY): 9 min  Charges:  $Gait Training: 8-22 mins                    G Codes:      Weston Anna, MPT Pager: [phone]

## 2014-07-01 LAB — CBC
HEMATOCRIT: 39.8 % (ref 36.0–46.0)
HEMOGLOBIN: 13.2 g/dL (ref 12.0–15.0)
MCH: 29.9 pg (ref 26.0–34.0)
MCHC: 33.2 g/dL (ref 30.0–36.0)
MCV: 90.2 fL (ref 78.0–100.0)
Platelets: UNDETERMINED 10*3/uL (ref 150–400)
RBC: 4.41 MIL/uL (ref 3.87–5.11)
RDW: 12.4 % (ref 11.5–15.5)
WBC: 11.2 10*3/uL — AB (ref 4.0–10.5)

## 2014-07-01 LAB — BASIC METABOLIC PANEL
Anion gap: 10 (ref 5–15)
BUN: 16 mg/dL (ref 6–20)
CHLORIDE: 97 mmol/L — AB (ref 101–111)
CO2: 26 mmol/L (ref 22–32)
CREATININE: 1.03 mg/dL — AB (ref 0.44–1.00)
Calcium: 8.8 mg/dL — ABNORMAL LOW (ref 8.9–10.3)
GFR calc Af Amer: 54 mL/min — ABNORMAL LOW (ref >60)
GFR, EST NON AFRICAN AMERICAN: 47 mL/min — AB (ref >60)
Glucose, Bld: 131 mg/dL — ABNORMAL HIGH (ref 65–99)
Potassium: 4.2 mmol/L (ref 3.5–5.1)
Sodium: 133 mmol/L — ABNORMAL LOW (ref 135–145)

## 2014-07-01 NOTE — Progress Notes (Signed)
5 Days Post-Op  Subjective:  1 - Bladder Cancer - s/p robotic radical cystectomy with ICG sentinal + pelvic lymphadenectomy + hysterectomy/BSO + ileal conduit urinary diversion 06/26/2014 for pT1N0Mx recurrent high grade bladder cancer with negative margins. Admitted 6/16 for pre-op bowel prep and stomal site marking. Observed stepdown overnight post-op and transferred to Oncology floor 6/18. ON-Q pain catheter removed 6/19. JP removed 6/21 as JP Cr same as serum.   2 - Post-op Ileus - s/p bowel anastomosis at surgery above. Started clear liq diet POD 2. Resumed BM POD 3 and transitioned to reg diet.  3 - Disposition / Rehab - independent at baseline. PT/OT consult post-op to help with DC planning, rec SNF at discharge and pt amenable.  Today "Victoria Patrick" continues to improve. Some mild abd discomfort but using minimal pain meds, tollerating reg diet.   Objective: Vital signs in last 24 hours: Temp:  [98.5 F (36.9 C)-98.9 F (37.2 C)] 98.5 F (36.9 C) (06/22 1343) Pulse Rate:  [94-105] 94 (06/22 1343) Resp:  [18-20] 18 (06/22 1343) BP: (142-146)/(57-74) 145/74 mmHg (06/22 1343) SpO2:  [95 %-98 %] 98 % (06/22 1343) Last BM Date: 06/29/14  Intake/Output from previous day: 06/21 0701 - 06/22 0700 In: 120 [P.O.:120] Out: 150 [Urine:150] Intake/Output this shift: Total I/O In: -  Out: 200 [Drains:200]  General appearance: alert, cooperative and very spry for age Head: Normocephalic, without obvious abnormality, atraumatic Nose: Nares normal. Septum midline. Mucosa normal. No drainage or sinus tenderness. Throat: lips, mucosa, and tongue normal; teeth and gums normal Neck: supple, symmetrical, trachea midline Back: symmetric, no curvature. ROM normal. No CVA tenderness. Resp: non-labored on room air Cardio: Nl rate GI: soft, non-tender; bowel sounds normal; no masses,  no organomegaly Pelvic: external genitalia normal and scant vaginal cuff discharge Extremities: extremities normal,  atraumatic, no cyanosis or edema Pulses: 2+ and symmetric Lymph nodes: Cervical, supraclavicular, and axillary nodes normal. Neurologic: Grossly normal Incision/Wound: RLQ Urostomy pink / patent with bander stents in situ. Recent port and extraction sites c/d/i. No hernias.   Lab Results:   Recent Labs  06/30/14 0418 07/01/14 0400  WBC 10.2 11.2*  HGB 14.0 13.2  HCT 40.2 39.8  PLT PLATELET CLUMPS NOTED ON SMEAR, UNABLE TO ESTIMATE PLATELET CLUMPS NOTED ON SMEAR, UNABLE TO ESTIMATE   BMET  Recent Labs  06/30/14 0418 07/01/14 0400  NA 133* 133*  K 4.0 4.2  CL 98* 97*  CO2 28 26  GLUCOSE 154* 131*  BUN 8 16  CREATININE 0.81 1.03*  CALCIUM 8.6* 8.8*   PT/INR No results for input(s): LABPROT, INR in the last 72 hours. ABG No results for input(s): PHART, HCO3 in the last 72 hours.  Invalid input(s): PCO2, PO2  Studies/Results: No results found.  Anti-infectives: Anti-infectives    Start     Dose/Rate Route Frequency Ordered Stop   06/26/14 1600  piperacillin-tazobactam (ZOSYN) IVPB 3.375 g     3.375 g 12.5 mL/hr over 240 Minutes Intravenous Every 8 hours 06/26/14 1511 06/28/14 1219   06/26/14 0000  [MAR Hold]  piperacillin-tazobactam (ZOSYN) IVPB 3.375 g     (MAR Hold since 06/26/14 0636)   3.375 g 100 mL/hr over 30 Minutes Intravenous 30 min pre-op 06/25/14 1315 06/26/14 0734      Assessment/Plan:  1 - Bladder Cancer - doing very well POD 5. Plan for DC tomorrow. Path discussed and very favorable implications of stage 1 disease (no chemo).   2 - Post-op Ileus - resolved clinically, continue reg  diet.   3 - Disposition / Rehab - appreciate PT/OT, SW, and ostomy RN help. Plan for DC tomorrow AM to [facility].  [facility], Victoria Patrick 07/01/2014

## 2014-07-01 NOTE — Consult Note (Signed)
WOC ostomy follow up Pt with plans to DC to [facility], provided SW with numbers for supplies needed so that facility will have what they need for her.  We will send with 3 urostomy pouchs and 3 barrier rings.  She has also extra BSD adapter.  Stoma type/location: RLQ, ileal conduit, with 2 stents in place Stomal assessment/size: 1 1/8" round, budded Peristomal assessment:  Intact  Treatment options for stomal/peristomal skin: 2" barrier ring to aid in seal Output clear, yellow urine, but does appear blood tinged in the BSD bag Ostomy pouching: 1pc.convex with 2" barrier ring.  Education provided:  Met with patient and I had her open and close current pouching system, also had her disconnect from her BSD bag several times.  She watched me cut new pouch and apply barrier ring, but admitted that in several hours she would not remember these steps. She has written educational materials in her room.  She was able to reconnect the BSD bag to the new pouch and open new pouch to allow to drain without my assistance. We reviewed pouch change schedule and when to empty.   She most definitely will need assist with her ostomy care for a while.  Enrolled patient in [place] Start Discharge program: Yes, previously per my partner  [place] team will follow along with you for support with ostomy care [place], [place]

## 2014-07-01 NOTE — Progress Notes (Signed)
CSW continuing to follow.  Pt plans to discharge to [facility] following hospitalization. Per MD, anticipate d/c tomorrow.   CSW was able to obtain the numbers for pt supplies from [place] in order to provide to [facility] for facility to order pt supplies. CSW noted that 3 urostomy pouchs and 3 barrier rings will be sent with pt to [facility] along with extra BSD adapter. Appreciative WOC nurse assistance.   CSW updated [facility] and provided support to pt at bedside.   CSW to assist with pt discharge to [facility] when medically ready for discharge.  Alison Murray, MSW, [place] [employer] [phone]

## 2014-07-02 DIAGNOSIS — D72829 Elevated white blood cell count, unspecified: Secondary | ICD-10-CM | POA: Diagnosis not present

## 2014-07-02 DIAGNOSIS — E039 Hypothyroidism, unspecified: Secondary | ICD-10-CM | POA: Diagnosis not present

## 2014-07-02 DIAGNOSIS — C678 Malignant neoplasm of overlapping sites of bladder: Secondary | ICD-10-CM | POA: Diagnosis not present

## 2014-07-02 DIAGNOSIS — Z5189 Encounter for other specified aftercare: Secondary | ICD-10-CM | POA: Diagnosis not present

## 2014-07-02 DIAGNOSIS — Z8551 Personal history of malignant neoplasm of bladder: Secondary | ICD-10-CM | POA: Diagnosis not present

## 2014-07-02 DIAGNOSIS — M6281 Muscle weakness (generalized): Secondary | ICD-10-CM | POA: Diagnosis not present

## 2014-07-02 DIAGNOSIS — I10 Essential (primary) hypertension: Secondary | ICD-10-CM | POA: Diagnosis not present

## 2014-07-02 DIAGNOSIS — C801 Malignant (primary) neoplasm, unspecified: Secondary | ICD-10-CM | POA: Diagnosis not present

## 2014-07-02 DIAGNOSIS — R5381 Other malaise: Secondary | ICD-10-CM | POA: Diagnosis not present

## 2014-07-02 DIAGNOSIS — D62 Acute posthemorrhagic anemia: Secondary | ICD-10-CM | POA: Diagnosis not present

## 2014-07-02 DIAGNOSIS — R2681 Unsteadiness on feet: Secondary | ICD-10-CM | POA: Diagnosis not present

## 2014-07-02 DIAGNOSIS — K567 Ileus, unspecified: Secondary | ICD-10-CM | POA: Diagnosis not present

## 2014-07-02 DIAGNOSIS — R41841 Cognitive communication deficit: Secondary | ICD-10-CM | POA: Diagnosis not present

## 2014-07-02 DIAGNOSIS — N289 Disorder of kidney and ureter, unspecified: Secondary | ICD-10-CM | POA: Diagnosis not present

## 2014-07-02 DIAGNOSIS — C679 Malignant neoplasm of bladder, unspecified: Secondary | ICD-10-CM | POA: Diagnosis not present

## 2014-07-02 DIAGNOSIS — K5900 Constipation, unspecified: Secondary | ICD-10-CM | POA: Diagnosis not present

## 2014-07-02 DIAGNOSIS — R278 Other lack of coordination: Secondary | ICD-10-CM | POA: Diagnosis not present

## 2014-07-02 MED ORDER — TRAMADOL HCL 50 MG PO TABS: 50.0000 mg | ORAL_TABLET | Freq: Four times a day (QID) | ORAL | Status: DC | PRN

## 2014-07-02 MED ORDER — SENNOSIDES-DOCUSATE SODIUM 8.6-50 MG PO TABS: 1.0000 | ORAL_TABLET | Freq: Two times a day (BID) | ORAL | Status: DC

## 2014-07-02 NOTE — Progress Notes (Signed)
Physical Therapy Treatment Patient Details Name: Victoria Patrick MRN: [id number] DOB: 07-22-1922 Today's Date: 07/02/2014    History of Present Illness This 81 year old female was admitted for bladder CA.  She is s/p Robotic pelvic exenteration with radical cystectomy, bilateral  PMH significant for HTN and DM    PT Comments    Progressing with mobility. Used RW on today. Overall, Min guard assist.   Follow Up Recommendations  SNF     Equipment Recommendations  None recommended by PT    Recommendations for Other Services       Precautions / Restrictions Precautions Precautions: Fall Restrictions Weight Bearing Restrictions: No    Mobility  Bed Mobility Overal bed mobility: Needs Assistance Bed Mobility: Supine to Sit;Sit to Supine     Supine to sit: Min guard Sit to supine: Min guard   General bed mobility comments: close guard for safety  Transfers Overall transfer level: Needs assistance Equipment used: Rolling walker (2 wheeled) Transfers: Sit to/from Stand Sit to Stand: Min guard         General transfer comment: VCs safety, hand placement  Ambulation/Gait Ambulation/Gait assistance: Min guard Ambulation Distance (Feet): 500 Feet Assistive device: Rolling walker (2 wheeled) Gait Pattern/deviations: Step-through pattern     General Gait Details: close guard for safety.    Stairs            Wheelchair Mobility    Modified Rankin (Stroke Patients Only)       Balance                                    Cognition Arousal/Alertness: Awake/alert Behavior During Therapy: WFL for tasks assessed/performed Overall Cognitive Status: Within Functional Limits for tasks assessed                      Exercises      General Comments        Pertinent Vitals/Pain Pain Assessment: No/denies pain    Home Living                      Prior Function            PT Goals (current goals can now be found in the  care plan section) Progress towards PT goals: Progressing toward goals    Frequency  Min 3X/week    PT Plan Current plan remains appropriate    Co-evaluation             End of Session   Activity Tolerance: Patient tolerated treatment well Patient left: in bed;with call bell/phone within reach;with bed alarm set     Time: 7096-2836 PT Time Calculation (min) (ACUTE ONLY): 13 min  Charges:  $Gait Training: 8-22 mins                    G Codes:      Weston Anna, MPT Pager: [phone]

## 2014-07-02 NOTE — Progress Notes (Signed)
Report called to Adonis Huguenin at [facility].

## 2014-07-02 NOTE — Clinical Social Work Placement (Signed)
   CLINICAL SOCIAL WORK PLACEMENT  NOTE  Date:  07/02/2014  Patient Details  Name: Victoria Patrick MRN: [id number] Date of Birth: [birth year]  Clinical Social Work is seeking post-discharge placement for this patient at the [place] level of care (*CSW will initial, date and re-position this form in  chart as items are completed):  Yes   Patient/family provided with [facility] Work Department's list of facilities offering this level of care within the geographic area requested by the patient (or if unable, by the patient's family).  Yes   Patient/family informed of their freedom to choose among providers that offer the needed level of care, that participate in Medicare, Medicaid or managed care program needed by the patient, have an available bed and are willing to accept the patient.  Yes   Patient/family informed of [facility]'s ownership interest in [facility] and [facility], as well as of the fact that they are under no obligation to receive care at these facilities.  PASRR submitted to EDS on 06/29/14     PASRR number received on 06/29/14     Existing PASRR number confirmed on       FL2 transmitted to all facilities in geographic area requested by pt/family on 06/29/14     FL2 transmitted to all facilities within larger geographic area on       Patient informed that his/her managed care company has contracts with or will negotiate with certain facilities, including the following:        Yes   Patient/family informed of bed offers received.  Patient chooses bed at [facility]     Physician recommends and patient chooses bed at      Patient to be transferred to [facility] on 07/02/14.  Patient to be transferred to facility by ambulance Corey Harold)     Patient family notified on 07/02/14 of transfer.  Name of family member notified:  pt notified at bedside     PHYSICIAN Please sign FL2     Additional Comment:     _______________________________________________ Ladell Pier, LCSW 07/02/2014, 12:47 PM

## 2014-07-02 NOTE — Discharge Summary (Signed)
Physician Discharge Summary  Patient ID: Victoria Patrick MRN: [id number] DOB/AGE: 08/11/1922 81 y.o.  Admit date: 06/25/2014 Discharge date: 07/02/2014  Admission Diagnoses: Bladder Cancer  Discharge Diagnoses:  Active Problems:   Bladder cancer   Discharged Condition: good  Hospital Course:   1 - Bladder Cancer - s/p robotic radical cystectomy with ICG sentinal + pelvic lymphadenectomy + hysterectomy/BSO + ileal conduit urinary diversion 06/26/2014 for pT1N0Mx recurrent high grade bladder cancer with negative margins. Admitted 6/16 for pre-op bowel prep and stomal site marking. Observed stepdown overnight post-op and transferred to Oncology floor 6/18. ON-Q pain catheter removed 6/19. JP removed 6/21 as JP Cr same as serum.   2 - Post-op Ileus - s/p bowel anastomosis at surgery above. Started clear liq diet POD 2. Resumed BM POD 3 and transitioned to reg diet which she is tollerating at discharge.   3 - Disposition / Rehab - independent at baseline. PT/OT consult post-op to help with DC planning, rec SNF at discharge and pt amenable.  In summary, pt had uneventful post-operative course following major cancer surgery and is stable for discharge to skilled nursing.   Consults: PT, OT, Ostomy RN, Social Work  Significant Diagnostic Studies: labs: Cr <1.5, Hgb >10 at [place].  Treatments: surgery: robotic radical cystectomy with ICG sentinal + pelvic lymphadenectomy + hysterectomy/BSO + ileal conduit urinary diversion 06/26/2014  Discharge Exam: Blood pressure 135/65, pulse 95, temperature 98.9 F (37.2 C), temperature source Oral, resp. rate 16, height 5\' 8"  (1.727 m), weight 65.7 kg (144 lb 13.5 oz), SpO2 97 %. General appearance: alert and cooperative Eyes: negative Ears: normal TM's and external ear canals both ears Throat: lips, mucosa, and tongue normal; teeth and gums normal Neck: supple, symmetrical, trachea midline Back: symmetric, no curvature. ROM normal. No CVA  tenderness. Resp: non-labored on room air Cardio: Nl rate GI: soft, non-tender; bowel sounds normal; no masses,  no organomegaly and RLQ Urosotmy pink / patent with copious clear urien in appliance. Rt red and Lt blue bander stents in situ. Pelvic: external genitalia normal and very scant vaginal cuff drainage Pulses: 2+ and symmetric Skin: Skin color, texture, turgor normal. No rashes or lesions Lymph nodes: Cervical, supraclavicular, and axillary nodes normal. Neurologic: Grossly normal Incision/Wound: recent port and extraction sites c/d/i.   Disposition: 01-Home or Self Care     Medication List    STOP taking these medications        calcium-vitamin D 500-200 MG-UNIT per tablet  Commonly known as:  OSCAL WITH D     Fish Oil 1000 MG Caps     multivitamin capsule     phenazopyridine 200 MG tablet  Commonly known as:  PYRIDIUM     PRESERVISION AREDS PO     Vitamin D-3 5000 UNITS Tabs      TAKE these medications        losartan-hydrochlorothiazide 100-12.5 MG per tablet  Commonly known as:  HYZAAR  Take 1 tablet by mouth every morning.     ondansetron 4 MG tablet  Commonly known as:  ZOFRAN  Take 1 tablet (4 mg total) by mouth every 8 (eight) hours as needed for nausea or vomiting.     senna-docusate 8.6-50 MG per tablet  Commonly known as:  Senokot-S  Take 1 tablet by mouth 2 (two) times daily. While taking pain meds to prevent constipation     SYNTHROID 88 MCG tablet  Generic drug:  levothyroxine  Take 88 mcg by mouth every morning.  traMADol 50 MG tablet  Commonly known as:  ULTRAM  Take 1-2 tablets (50-100 mg total) by mouth every 6 (six) hours as needed for moderate pain. Post-operatively           Follow-up Information    Follow up with Alexis Frock, MD On 07/10/2014.   Specialty:  Urology   Why:  at 1:00   Contact information:   [place] [address] [phone]       Signed: Alexis Frock 07/02/2014, 7:54 AM

## 2014-07-02 NOTE — Progress Notes (Signed)
Pt for discharge to [facility].   CSW facilitated pt discharge needs including contacting facility, faxing pt discharge information via [facility], discussing with pt at bedside, providing RN phone number to call report, and arranging ambulance transport for pt to [facility].   Pt eager to get to [facility] to begin short term rehab and continued education surrounding urostomy bag. Pt appreciative of CSW support and assistance.   No further social work needs identified at this time.  CSW signing off.   Alison Murray, MSW, [place] Work [phone]

## 2014-07-02 NOTE — Progress Notes (Signed)
Occupational Therapy Treatment Patient Details Name: KEDRA MCGLADE MRN: [id number] DOB: 1922-08-13 Today's Date: 07/02/2014    History of present illness This 81 year old female was admitted for bladder CA.  She is s/p Robotic pelvic exenteration with radical cystectomy, bilateral  PMH significant for HTN and DM   OT comments  Pt is making progress with OT:  Needs min guard for balance with RW  Follow Up Recommendations  SNF    Equipment Recommendations  3 in 1 bedside comode    Recommendations for Other Services      Precautions / Restrictions Precautions Precautions: Fall Restrictions Weight Bearing Restrictions: No       Mobility Bed Mobility           General bed mobility comments: oob  Transfers Overall transfer level: Needs assistance Equipment used: Rolling walker (2 wheeled) Transfers: Sit to/from Stand Sit to Stand: Min guard         General transfer comment: vcs for hand placement and safety    Balance Overall balance assessment: Needs assistance         Standing balance support: During functional activity Standing balance-Leahy Scale: Poor                     ADL       Grooming: Standing;Supervision/safety;Wash/dry hands                                 General ADL Comments: pt ambulated to bathroom with min guard/min A, one LOB without RW,.  Used RW to ambulate to closet and retrieve clothes with walker with min guard      Vision                     Perception     Praxis      Cognition   Behavior During Therapy: [facility] for tasks assessed/performed Overall Cognitive Status: Within Functional Limits for tasks assessed                       Extremity/Trunk Assessment               Exercises     Shoulder Instructions       General Comments      Pertinent Vitals/ Pain       Pain Assessment: No/denies pain  Home Living                                          Prior Functioning/Environment              Frequency Min 2X/week     Progress Toward Goals  OT Goals(current goals can now be found in the care plan section)  Progress towards OT goals: Progressing toward goals     Plan      Co-evaluation                 End of Session     Activity Tolerance Patient tolerated treatment well   Patient Left in chair;with call bell/phone within reach;with chair alarm set   Nurse Communication          Time: 6734-1937 OT Time Calculation (min): 10 min  Charges: OT General Charges $OT Visit: 1 Procedure OT Treatments $Self Care/Home Management : 8-22 mins  Menashe Kafer 07/02/2014, 2:06 PM   Lesle Chris, OTR/L [phone] 07/02/2014

## 2014-07-03 ENCOUNTER — Encounter: Payer: Self-pay | Admitting: Adult Health

## 2014-07-03 ENCOUNTER — Non-Acute Institutional Stay (SKILLED_NURSING_FACILITY): Payer: Medicare Other | Admitting: Adult Health

## 2014-07-03 DIAGNOSIS — K5900 Constipation, unspecified: Secondary | ICD-10-CM | POA: Diagnosis not present

## 2014-07-03 DIAGNOSIS — D72829 Elevated white blood cell count, unspecified: Secondary | ICD-10-CM | POA: Diagnosis not present

## 2014-07-03 DIAGNOSIS — C678 Malignant neoplasm of overlapping sites of bladder: Secondary | ICD-10-CM | POA: Diagnosis not present

## 2014-07-03 DIAGNOSIS — I10 Essential (primary) hypertension: Secondary | ICD-10-CM | POA: Diagnosis not present

## 2014-07-03 DIAGNOSIS — E039 Hypothyroidism, unspecified: Secondary | ICD-10-CM

## 2014-07-03 DIAGNOSIS — R5381 Other malaise: Secondary | ICD-10-CM | POA: Diagnosis not present

## 2014-07-06 NOTE — Progress Notes (Signed)
Patient ID: Victoria Patrick, female   DOB: 08-Aug-1922, 81 y.o.   MRN: [id number]   07/03/14  Facility:  Nursing Home Location:  [place] Room Number: 027-2 LEVEL OF CARE:  SNF (31)   Chief Complaint  Patient presents with  . Hospitalization Follow-up    Physical deconditioning, bladder cancer, hypertension, hypothyroidism, constipation and leukocytosis    HISTORY OF PRESENT ILLNESS:  This is an 81 year old female who was been admitted to [facility] on 07/02/14 from [facility]. She has PMH hypertension, borderline diabetes mellitus, hypothyroidism, dysrhythmia, anxiety and skin cancer. She has diagnosis of bladder cancer S/P robotic radical cystectomy with ICG sentinel + pelvic lymphadenectomy + hysterectomy + BSO + ileal conduit urinary diversion on 6/17 for pT1N0Mx recurrent high-grade bladder cancer with negative margins.  She has been admitted for a short-term rehabilitation.  PAST MEDICAL HISTORY:  Past Medical History  Diagnosis Date  . Hx of bronchitis     age 28   . Boils     hx of  years ago   . Hypertension   . Dysrhythmia     occasional skip   . Hypothyroidism   . Diabetes mellitus without complication     borderline on no meds   . Anxiety   . Cancer     hx of skin cancer on face     CURRENT MEDICATIONS: Reviewed per MAR/see medication list  Allergies  Allergen Reactions  . Diovan [Valsartan] Itching  . Codeine Nausea And Vomiting     REVIEW OF SYSTEMS:  GENERAL: no change in appetite, no fatigue, no weight changes, no fever, chills or weakness RESPIRATORY: no cough, SOB, DOE, wheezing, hemoptysis CARDIAC: no chest pain, edema or palpitations GI: no abdominal pain, diarrhea, constipation, heart burn, nausea or vomiting  PHYSICAL EXAMINATION  GENERAL: no acute distress, normal body habitus SKIN:  Small surgical wound on abdomen, dry, no redness EYES: conjunctivae normal, sclerae normal, normal eye lids NECK:  supple, trachea midline, no neck masses, no thyroid tenderness, no thyromegaly LYMPHATICS: no LAN in the neck, no supraclavicular LAN RESPIRATORY: breathing is even & unlabored, BS CTAB CARDIAC: RRR, no murmur,no extra heart sounds, no edema GI: abdomen soft, normal BS, no masses, no tenderness, no hepatomegaly, no splenomegaly GU:  + Ileal conduit with pouch draining to urine bag EXTREMITIES:  Able to move 4 extremities PSYCHIATRIC: the patient is alert & oriented to person, affect & behavior appropriate  LABS/RADIOLOGY: Labs reviewed: Basic Metabolic Panel:  Recent Labs  06/29/14 0411 06/30/14 0418 07/01/14 0400  NA 136 133* 133*  K 4.2 4.0 4.2  CL 103 98* 97*  CO2 29 28 26   GLUCOSE 132* 154* 131*  BUN 7 8 16   CREATININE 0.84 0.81 1.03*  CALCIUM 8.8* 8.6* 8.8*   Liver Function Tests:  Recent Labs  06/25/14 1511  AST 27  ALT 17  ALKPHOS 60  BILITOT 0.6  PROT 7.3  ALBUMIN 4.5    CBC:  Recent Labs  06/29/14 0411 06/30/14 0418 07/01/14 0400  WBC 8.0 10.2 11.2*  HGB 12.4 14.0 13.2  HCT 37.7 40.2 39.8  MCV 94.3 89.3 90.2  PLT PLATELET CLUMPS NOTED ON SMEAR, UNABLE TO ESTIMATE PLATELET CLUMPS NOTED ON SMEAR, UNABLE TO ESTIMATE PLATELET CLUMPS NOTED ON SMEAR, UNABLE TO ESTIMATE   CBG:  Recent Labs  04/30/14 0819 06/26/14 0529 06/26/14 1429  GLUCAP 104* 94 180*    ASSESSMENT/PLAN:  Physical deconditioning - for rehabilitation  Bladder cancer -  S/P robotic radical cystectomy with ICG sentinel + pelvic lymphadenectomy + hysterectomy + BSO + ileal conduit urinary diversion - continue tramadol 50 mg 1-2 tabs by mouth every 6 hours when necessary; follow-up with Dr. Alexis Frock, urology, on 07/10/14  Hypertension - continue Hyzaar 12.5 mg by mouth every morning  Constipation - continue Senokot S1 tab by mouth twice a day  Hypothyroidism - continue levothyroxine 88 g 1 tab by mouth daily  Leukocytosis - wbc 11.2; will monitor    Goals of care:   Short-term rehabilitation   Labs/test ordered:  TSH, BMP and CBC  Spent 50 minutes in patient care.    [facility], NP [employer] [phone]

## 2014-07-07 ENCOUNTER — Non-Acute Institutional Stay (SKILLED_NURSING_FACILITY): Payer: Medicare Other | Admitting: Internal Medicine

## 2014-07-07 DIAGNOSIS — C679 Malignant neoplasm of bladder, unspecified: Secondary | ICD-10-CM | POA: Diagnosis not present

## 2014-07-07 DIAGNOSIS — R5381 Other malaise: Secondary | ICD-10-CM

## 2014-07-07 DIAGNOSIS — N289 Disorder of kidney and ureter, unspecified: Secondary | ICD-10-CM | POA: Diagnosis not present

## 2014-07-07 DIAGNOSIS — D72829 Elevated white blood cell count, unspecified: Secondary | ICD-10-CM | POA: Diagnosis not present

## 2014-07-07 DIAGNOSIS — K5900 Constipation, unspecified: Secondary | ICD-10-CM

## 2014-07-07 DIAGNOSIS — E039 Hypothyroidism, unspecified: Secondary | ICD-10-CM | POA: Diagnosis not present

## 2014-07-07 DIAGNOSIS — I10 Essential (primary) hypertension: Secondary | ICD-10-CM | POA: Diagnosis not present

## 2014-07-07 NOTE — Progress Notes (Signed)
Patient ID: Victoria Patrick, female   DOB: 26-Sep-1922, 81 y.o.   MRN: [id number]     [facility]   PCP: Horatio Pel, MD  Code Status: full code  Allergies  Allergen Reactions  . Diovan [Valsartan] Itching  . Codeine Nausea And Vomiting    Chief Complaint  Patient presents with  . New Admit To SNF     HPI:  81 year old patient is here for short term rehabilitation post hospital admission from 06/25/14-07/02/14 with malignant bladder cancer with ureteral obstruction. She underwent robotic radical cystectomy with ICG sentinel and pelvic lymphadenectomy, hysterectomy and BSO. She had ileal conduit urinary diversion on 6/17 for pT1N0Mx recurrent high-grade bladder cancer with negative margins. She had post op ileus and diet was advanced as tolerated. She has PMH of hypertension, diabetes mellitus, hypothyroidism among others. She is seen in her room today. She feels tired but denies any other concerns. She had loose stool last night. Had regular bowel movement this am.   Review of Systems:  Constitutional: Negative for fever, chills, diaphoresis.  HENT: Negative for headache, congestion, nasal discharge Eyes: Negative for eye pain, blurred vision, double vision and discharge.  Respiratory: Negative for cough, shortness of breath and wheezing.   Cardiovascular: Negative for chest pain, palpitations, leg swelling.  Gastrointestinal: Negative for heartburn, nausea, vomiting, abdominal pain. Appetite is picking up, had good breakfast this am Genitourinary: has ileal conduit Musculoskeletal: Negative for back pain, falls Skin: Negative for itching, rash.  Neurological: Negative for dizziness, tingling, focal weakness Psychiatric/Behavioral: Negative for depression   Past Medical History  Diagnosis Date  . Hx of bronchitis     age 41   . Boils     hx of  years ago   . Hypertension   . Dysrhythmia     occasional skip   . Hypothyroidism   .  Diabetes mellitus without complication     borderline on no meds   . Anxiety   . Cancer     hx of skin cancer on face    Past Surgical History  Procedure Laterality Date  . Hx of skin cancer removal      locally done   . Cystoscopy N/A 04/30/2014    Procedure: CYSTOSCOPY;  Surgeon: Irine Seal, MD;  Location: [facility];  Service: Urology;  Laterality: N/A;  . Transurethral resection of bladder tumor N/A 04/30/2014    Procedure: TRANSURETHRAL RESECTION OF BLADDER TUMOR (TURBT);  Surgeon: Irine Seal, MD;  Location: [facility];  Service: Urology;  Laterality: N/A;  . Robot assisted laparoscopic complete cystect ileal conduit N/A 06/26/2014    Procedure: ROBOTIC ASSISTED LAPAROSCOPIC COMPLETE CYSTECT ILEAL CONDUIT;  Surgeon: Alexis Frock, MD;  Location: [facility];  Service: Urology;  Laterality: N/A;  . Robotic assisted laparoscopic hysterectomy and salpingectomy N/A 06/26/2014    Procedure: ROBOTIC ASSISTED LAPAROSCOPIC HYSTERECTOMY AND BILATERAL SALPINGO-OPHERECTOMY;  Surgeon: Alexis Frock, MD;  Location: [facility];  Service: Urology;  Laterality: N/A;  . Cystoscopy with injection N/A 06/26/2014    Procedure: CYSTOSCOPY WITH INJECTION OF INDOCYANINE GREEN DYE;  Surgeon: Alexis Frock, MD;  Location: [facility];  Service: Urology;  Laterality: N/A;   Social History:   reports that she has quit smoking. She has never used smokeless tobacco. She reports that she drinks alcohol. She reports that she does not use illicit drugs.  No family history on file.  Medications: Patient's Medications  New Prescriptions   No medications on file  Previous Medications  LOSARTAN-HYDROCHLOROTHIAZIDE (HYZAAR) 100-12.5 MG PER TABLET    Take 1 tablet by mouth every morning.    ONDANSETRON (ZOFRAN) 4 MG TABLET    Take 1 tablet (4 mg total) by mouth every 8 (eight) hours as needed for nausea or vomiting.   SENNA-DOCUSATE (SENOKOT-S) 8.6-50 MG PER TABLET    Take 1 tablet by mouth 2 (two) times daily. While taking pain meds to  prevent constipation   SYNTHROID 88 MCG TABLET    Take 88 mcg by mouth every morning.    TRAMADOL (ULTRAM) 50 MG TABLET    Take 1-2 tablets (50-100 mg total) by mouth every 6 (six) hours as needed for moderate pain. Post-operatively  Modified Medications   No medications on file  Discontinued Medications   No medications on file     Physical Exam: Filed Vitals:   07/07/14 1617  BP: 111/74  Pulse: 88  Temp: 97.3 F (36.3 C)  Resp: 18  Height: 5\' 8"  (1.727 m)  Weight: 134 lb 12.8 oz (61.145 kg)  SpO2: 96%    General- elderly female, well built, in no acute distress Head- normocephalic, atraumatic Throat- moist mucus membrane Eyes- PERRLA, EOMI, no pallor, no icterus, no discharge, normal conjunctiva, normal sclera Neck- no cervical lymphadenopathy Cardiovascular- normal s1,s2, no murmurs, palpable dorsalis pedis and radial pulses, no leg edema Respiratory- bilateral clear to auscultation, no wheeze, no rhonchi, no crackles, no use of accessory muscles Abdomen- bowel sounds present, soft, non tender, ostomy site clean Musculoskeletal- able to move all 4 extremities, generalized weakness  Neurological- no focal deficit Skin- warm and dry, surgical incisions healing well, foley has good urine output Psychiatry- alert and oriented to person, place and time, normal mood and affect    Labs reviewed: Basic Metabolic Panel:  Recent Labs  06/29/14 0411 06/30/14 0418 07/01/14 0400  NA 136 133* 133*  K 4.2 4.0 4.2  CL 103 98* 97*  CO2 29 28 26   GLUCOSE 132* 154* 131*  BUN 7 8 16   CREATININE 0.84 0.81 1.03*  CALCIUM 8.8* 8.6* 8.8*   Liver Function Tests:  Recent Labs  06/25/14 1511  AST 27  ALT 17  ALKPHOS 60  BILITOT 0.6  PROT 7.3  ALBUMIN 4.5   No results for input(s): LIPASE, AMYLASE in the last 8760 hours. No results for input(s): AMMONIA in the last 8760 hours. CBC:  Recent Labs  06/29/14 0411 06/30/14 0418 07/01/14 0400  WBC 8.0 10.2 11.2*  HGB  12.4 14.0 13.2  HCT 37.7 40.2 39.8  MCV 94.3 89.3 90.2  PLT PLATELET CLUMPS NOTED ON SMEAR, UNABLE TO ESTIMATE PLATELET CLUMPS NOTED ON SMEAR, UNABLE TO ESTIMATE PLATELET CLUMPS NOTED ON SMEAR, UNABLE TO ESTIMATE   Cardiac Enzymes: No results for input(s): CKTOTAL, CKMB, CKMBINDEX, TROPONINI in the last 8760 hours. BNP: Invalid input(s): POCBNP CBG:  Recent Labs  04/30/14 0819 06/26/14 0529 06/26/14 1429  GLUCAP 104* 94 180*     Assessment/Plan  Physical deconditioning Will have her work with physical therapy and occupational therapy team to help with gait training and muscle strengthening exercises.fall precautions. Skin care. Encourage to be out of bed.   Bladder cancer S/P robotic radical cystectomy with ICG sentinel + pelvic lymphadenectomy + hysterectomy + BSO + ileal conduit urinary diversion. Continue skin care. continue tramadol 50 mg 1-2 tabs every 6 hours as need for pain. Has follow up with urology on 07/10/14. Continue foley and ileal conduit/ ostomy site care  impaired renal function Likely post renal from  ureteral obstruction from bladder cancer. Should improve post surgery, monitor renal function  Leukocytosis No signs of infection on exam, monitor cbc and temp curve  Hypertension Stable bp, continue Hyzaar 12.5 mg daily, monitor bp  Constipation continue Senokot S bid  Hypothyroidism continue levothyroxine 88 mcg daily   Goals of care: short term rehabilitation   Labs/tests ordered: cbc, bmp  Family/ staff Communication: reviewed care plan with patient and nursing supervisor    Blanchie Serve, MD  [facility] [phone] (Monday-Friday 8 am - 5 pm) [phone] (afterhours)

## 2014-07-22 ENCOUNTER — Non-Acute Institutional Stay (SKILLED_NURSING_FACILITY): Payer: Medicare Other | Admitting: Adult Health

## 2014-07-22 ENCOUNTER — Encounter: Payer: Self-pay | Admitting: Adult Health

## 2014-07-22 DIAGNOSIS — C679 Malignant neoplasm of bladder, unspecified: Secondary | ICD-10-CM | POA: Diagnosis not present

## 2014-07-22 DIAGNOSIS — E039 Hypothyroidism, unspecified: Secondary | ICD-10-CM

## 2014-07-22 DIAGNOSIS — I10 Essential (primary) hypertension: Secondary | ICD-10-CM

## 2014-07-22 DIAGNOSIS — K5900 Constipation, unspecified: Secondary | ICD-10-CM | POA: Diagnosis not present

## 2014-07-22 DIAGNOSIS — R5381 Other malaise: Secondary | ICD-10-CM | POA: Diagnosis not present

## 2014-07-22 DIAGNOSIS — D62 Acute posthemorrhagic anemia: Secondary | ICD-10-CM

## 2014-07-23 NOTE — Progress Notes (Signed)
Patient ID: Victoria Patrick, female   DOB: Jan 06, 1923, 81 y.o.   MRN: [id number]   07/22/14  Facility:  [facility] Home Location:  [place] Room Number: 790-2 LEVEL OF CARE:  SNF (31)   Chief Complaint  Patient presents with  . Discharge Note    Physical deconditioning, bladder cancer, hypertension, hypothyroidism and constipation     HISTORY OF PRESENT ILLNESS:  This is an 81 year old female who is for discharge home with Home health PT, OT, Nursing and ST. She has been admitted to [facility] on 07/02/14 from [facility]. She has PMH hypertension, borderline diabetes mellitus, hypothyroidism, dysrhythmia, anxiety and skin cancer. She has diagnosis of bladder cancer S/P robotic radical cystectomy with ICG sentinel + pelvic lymphadenectomy + hysterectomy + BSO + ileal conduit urinary diversion on 6/17 for pT1N0Mx recurrent high-grade bladder cancer with negative margins.  Referral to neurology was done due to difficulty recalling self care and SLUMS score 20/30.  Patient was admitted to this facility for short-term rehabilitation after the patient's recent hospitalization.  Patient has completed SNF rehabilitation and therapy has cleared the patient for discharge.  PAST MEDICAL HISTORY:  Past Medical History  Diagnosis Date  . Hx of bronchitis     age 79   . Boils     hx of  years ago   . Hypertension   . Dysrhythmia     occasional skip   . Hypothyroidism   . Diabetes mellitus without complication     borderline on no meds   . Anxiety   . Cancer     hx of skin cancer on face     CURRENT MEDICATIONS: Reviewed per MAR/see medication list  Allergies  Allergen Reactions  . Diovan [Valsartan] Itching  . Codeine Nausea And Vomiting     REVIEW OF SYSTEMS:  GENERAL: no change in appetite, no fatigue, no weight changes, no fever, chills or weakness RESPIRATORY: no cough, SOB, DOE, wheezing, hemoptysis CARDIAC: no chest pain, edema or  palpitations GI: no abdominal pain, diarrhea, constipation, heart burn, nausea or vomiting  PHYSICAL EXAMINATION  GENERAL: no acute distress, normal body habitus EYES: conjunctivae normal, sclerae normal, normal eye lids NECK: supple, trachea midline, no neck masses, no thyroid tenderness, no thyromegaly LYMPHATICS: no LAN in the neck, no supraclavicular LAN RESPIRATORY: breathing is even & unlabored, BS CTAB CARDIAC: RRR, no murmur,no extra heart sounds, no edema GI: abdomen soft, normal BS, no masses, no tenderness, no hepatomegaly, no splenomegaly GU:  + Ileal conduit with pouch draining to urine bag EXTREMITIES:  Able to move 4 extremities PSYCHIATRIC: the patient is alert & oriented to person, affect & behavior appropriate  LABS/RADIOLOGY: Labs reviewed: 07/15/14  WBC 7.7 hemoglobin 10.7 hematocrit 31.9 MCV 88.9 platelet 146 sodium 137 potassium 4.2 glucose 91 BUN 14 creatinine 0.75 calcium 8.2 TSH 3.676 07/07/14  T4 8 0.0 T3 uptake 35 free thyroxine index 2.8 sodium 134 potassium 3.1 glucose 101 BUN 15 creatinine 0.82 calcium 7.8 Basic Metabolic Panel:  Recent Labs  06/29/14 0411 06/30/14 0418 07/01/14 0400  NA 136 133* 133*  K 4.2 4.0 4.2  CL 103 98* 97*  CO2 29 28 26   GLUCOSE 132* 154* 131*  BUN 7 8 16   CREATININE 0.84 0.81 1.03*  CALCIUM 8.8* 8.6* 8.8*   Liver Function Tests:  Recent Labs  06/25/14 1511  AST 27  ALT 17  ALKPHOS 60  BILITOT 0.6  PROT 7.3  ALBUMIN 4.5  CBC:  Recent Labs  06/29/14 0411 06/30/14 0418 07/01/14 0400  WBC 8.0 10.2 11.2*  HGB 12.4 14.0 13.2  HCT 37.7 40.2 39.8  MCV 94.3 89.3 90.2  PLT PLATELET CLUMPS NOTED ON SMEAR, UNABLE TO ESTIMATE PLATELET CLUMPS NOTED ON SMEAR, UNABLE TO ESTIMATE PLATELET CLUMPS NOTED ON SMEAR, UNABLE TO ESTIMATE   CBG:  Recent Labs  04/30/14 0819 06/26/14 0529 06/26/14 1429  GLUCAP 104* 94 180*    ASSESSMENT/PLAN:  Physical deconditioning - for rehabilitation  Bladder cancer -  S/P  robotic radical cystectomy with ICG sentinel + pelvic lymphadenectomy + hysterectomy + BSO + ileal conduit urinary diversion - continue tramadol 50 mg 1-2 tabs by mouth every 6 hours when necessary; follow-up with Dr. Alexis Frock, urology, on 08/06/14. She was instructed to take sulfamethoxazole-trimethoprim 800-160 mg 1 tab PO BID X 3 days prior to visit  Hypertension - continue Hyzaar 12.5 mg by mouth every morning  Constipation - continue Senokot S1 tab by mouth twice a day  Hypothyroidism - continue levothyroxine 88 g 1 tab by mouth daily  Anemia, acute blood loss - hgb 10.7; continue FeSO4 325 mg 1 tab PO Q D     I have filled out patient's discharge paperwork and written prescriptions.  Patient will receive home health PT, OT, ST and CNA.  Total discharge time: Less than 30 minutes  Discharge time involved coordination of the discharge process with Education officer, museum, nursing staff and therapy department. Medical justification for home health services verified.   [facility], NP [employer] [phone]

## 2014-07-24 DIAGNOSIS — G3184 Mild cognitive impairment, so stated: Secondary | ICD-10-CM | POA: Diagnosis not present

## 2014-07-24 DIAGNOSIS — C679 Malignant neoplasm of bladder, unspecified: Secondary | ICD-10-CM | POA: Diagnosis not present

## 2014-07-24 DIAGNOSIS — Z483 Aftercare following surgery for neoplasm: Secondary | ICD-10-CM | POA: Diagnosis not present

## 2014-07-24 DIAGNOSIS — R2681 Unsteadiness on feet: Secondary | ICD-10-CM | POA: Diagnosis not present

## 2014-07-24 DIAGNOSIS — E119 Type 2 diabetes mellitus without complications: Secondary | ICD-10-CM | POA: Diagnosis not present

## 2014-07-24 DIAGNOSIS — Z936 Other artificial openings of urinary tract status: Secondary | ICD-10-CM | POA: Diagnosis not present

## 2014-07-24 DIAGNOSIS — M6281 Muscle weakness (generalized): Secondary | ICD-10-CM | POA: Diagnosis not present

## 2014-07-24 DIAGNOSIS — R278 Other lack of coordination: Secondary | ICD-10-CM | POA: Diagnosis not present

## 2014-07-24 DIAGNOSIS — F419 Anxiety disorder, unspecified: Secondary | ICD-10-CM | POA: Diagnosis not present

## 2014-07-27 DIAGNOSIS — F419 Anxiety disorder, unspecified: Secondary | ICD-10-CM | POA: Diagnosis not present

## 2014-07-27 DIAGNOSIS — E119 Type 2 diabetes mellitus without complications: Secondary | ICD-10-CM | POA: Diagnosis not present

## 2014-07-27 DIAGNOSIS — Z483 Aftercare following surgery for neoplasm: Secondary | ICD-10-CM | POA: Diagnosis not present

## 2014-07-27 DIAGNOSIS — Z936 Other artificial openings of urinary tract status: Secondary | ICD-10-CM | POA: Diagnosis not present

## 2014-07-27 DIAGNOSIS — C679 Malignant neoplasm of bladder, unspecified: Secondary | ICD-10-CM | POA: Diagnosis not present

## 2014-07-27 DIAGNOSIS — G3184 Mild cognitive impairment, so stated: Secondary | ICD-10-CM | POA: Diagnosis not present

## 2014-07-28 DIAGNOSIS — G3184 Mild cognitive impairment, so stated: Secondary | ICD-10-CM | POA: Diagnosis not present

## 2014-07-28 DIAGNOSIS — F419 Anxiety disorder, unspecified: Secondary | ICD-10-CM | POA: Diagnosis not present

## 2014-07-28 DIAGNOSIS — C679 Malignant neoplasm of bladder, unspecified: Secondary | ICD-10-CM | POA: Diagnosis not present

## 2014-07-28 DIAGNOSIS — E119 Type 2 diabetes mellitus without complications: Secondary | ICD-10-CM | POA: Diagnosis not present

## 2014-07-28 DIAGNOSIS — Z936 Other artificial openings of urinary tract status: Secondary | ICD-10-CM | POA: Diagnosis not present

## 2014-07-28 DIAGNOSIS — Z483 Aftercare following surgery for neoplasm: Secondary | ICD-10-CM | POA: Diagnosis not present

## 2014-07-31 DIAGNOSIS — Z936 Other artificial openings of urinary tract status: Secondary | ICD-10-CM | POA: Diagnosis not present

## 2014-07-31 DIAGNOSIS — Z483 Aftercare following surgery for neoplasm: Secondary | ICD-10-CM | POA: Diagnosis not present

## 2014-07-31 DIAGNOSIS — G3184 Mild cognitive impairment, so stated: Secondary | ICD-10-CM | POA: Diagnosis not present

## 2014-07-31 DIAGNOSIS — F419 Anxiety disorder, unspecified: Secondary | ICD-10-CM | POA: Diagnosis not present

## 2014-07-31 DIAGNOSIS — E119 Type 2 diabetes mellitus without complications: Secondary | ICD-10-CM | POA: Diagnosis not present

## 2014-07-31 DIAGNOSIS — C679 Malignant neoplasm of bladder, unspecified: Secondary | ICD-10-CM | POA: Diagnosis not present

## 2014-08-06 DIAGNOSIS — G3184 Mild cognitive impairment, so stated: Secondary | ICD-10-CM | POA: Diagnosis not present

## 2014-08-06 DIAGNOSIS — C679 Malignant neoplasm of bladder, unspecified: Secondary | ICD-10-CM | POA: Diagnosis not present

## 2014-08-06 DIAGNOSIS — E119 Type 2 diabetes mellitus without complications: Secondary | ICD-10-CM | POA: Diagnosis not present

## 2014-08-06 DIAGNOSIS — F419 Anxiety disorder, unspecified: Secondary | ICD-10-CM | POA: Diagnosis not present

## 2014-08-06 DIAGNOSIS — Z936 Other artificial openings of urinary tract status: Secondary | ICD-10-CM | POA: Diagnosis not present

## 2014-08-06 DIAGNOSIS — Z483 Aftercare following surgery for neoplasm: Secondary | ICD-10-CM | POA: Diagnosis not present

## 2014-08-13 DIAGNOSIS — F419 Anxiety disorder, unspecified: Secondary | ICD-10-CM | POA: Diagnosis not present

## 2014-08-13 DIAGNOSIS — Z483 Aftercare following surgery for neoplasm: Secondary | ICD-10-CM | POA: Diagnosis not present

## 2014-08-13 DIAGNOSIS — C679 Malignant neoplasm of bladder, unspecified: Secondary | ICD-10-CM | POA: Diagnosis not present

## 2014-08-13 DIAGNOSIS — G3184 Mild cognitive impairment, so stated: Secondary | ICD-10-CM | POA: Diagnosis not present

## 2014-08-13 DIAGNOSIS — Z936 Other artificial openings of urinary tract status: Secondary | ICD-10-CM | POA: Diagnosis not present

## 2014-08-13 DIAGNOSIS — E119 Type 2 diabetes mellitus without complications: Secondary | ICD-10-CM | POA: Diagnosis not present

## 2014-08-31 ENCOUNTER — Encounter (HOSPITAL_COMMUNITY): Payer: Self-pay | Admitting: Internal Medicine

## 2014-08-31 ENCOUNTER — Emergency Department (HOSPITAL_COMMUNITY): Payer: Medicare Other

## 2014-08-31 ENCOUNTER — Inpatient Hospital Stay (HOSPITAL_COMMUNITY): Payer: Medicare Other

## 2014-08-31 ENCOUNTER — Inpatient Hospital Stay (HOSPITAL_COMMUNITY)
Admission: EM | Admit: 2014-08-31 | Discharge: 2014-09-03 | DRG: 689 | Disposition: A | Discharge status: Skilled Nursing Facility | Payer: Medicare Other | Attending: Internal Medicine | Admitting: Internal Medicine

## 2014-08-31 DIAGNOSIS — Z85828 Personal history of other malignant neoplasm of skin: Secondary | ICD-10-CM

## 2014-08-31 DIAGNOSIS — N289 Disorder of kidney and ureter, unspecified: Secondary | ICD-10-CM | POA: Diagnosis not present

## 2014-08-31 DIAGNOSIS — R197 Diarrhea, unspecified: Secondary | ICD-10-CM | POA: Diagnosis not present

## 2014-08-31 DIAGNOSIS — Z888 Allergy status to other drugs, medicaments and biological substances status: Secondary | ICD-10-CM

## 2014-08-31 DIAGNOSIS — E1122 Type 2 diabetes mellitus with diabetic chronic kidney disease: Secondary | ICD-10-CM | POA: Diagnosis present

## 2014-08-31 DIAGNOSIS — Z823 Family history of stroke: Secondary | ICD-10-CM | POA: Diagnosis not present

## 2014-08-31 DIAGNOSIS — Z885 Allergy status to narcotic agent status: Secondary | ICD-10-CM

## 2014-08-31 DIAGNOSIS — Z66 Do not resuscitate: Secondary | ICD-10-CM | POA: Diagnosis present

## 2014-08-31 DIAGNOSIS — Z7189 Other specified counseling: Secondary | ICD-10-CM

## 2014-08-31 DIAGNOSIS — E039 Hypothyroidism, unspecified: Secondary | ICD-10-CM | POA: Diagnosis present

## 2014-08-31 DIAGNOSIS — R5383 Other fatigue: Secondary | ICD-10-CM | POA: Diagnosis not present

## 2014-08-31 DIAGNOSIS — D696 Thrombocytopenia, unspecified: Secondary | ICD-10-CM | POA: Diagnosis present

## 2014-08-31 DIAGNOSIS — F419 Anxiety disorder, unspecified: Secondary | ICD-10-CM | POA: Diagnosis present

## 2014-08-31 DIAGNOSIS — Z681 Body mass index (BMI) 19 or less, adult: Secondary | ICD-10-CM

## 2014-08-31 DIAGNOSIS — R410 Disorientation, unspecified: Secondary | ICD-10-CM | POA: Diagnosis not present

## 2014-08-31 DIAGNOSIS — E871 Hypo-osmolality and hyponatremia: Secondary | ICD-10-CM | POA: Diagnosis not present

## 2014-08-31 DIAGNOSIS — N183 Chronic kidney disease, stage 3 (moderate): Secondary | ICD-10-CM | POA: Diagnosis present

## 2014-08-31 DIAGNOSIS — F0390 Unspecified dementia without behavioral disturbance: Secondary | ICD-10-CM | POA: Diagnosis present

## 2014-08-31 DIAGNOSIS — G9340 Encephalopathy, unspecified: Secondary | ICD-10-CM | POA: Diagnosis present

## 2014-08-31 DIAGNOSIS — Z906 Acquired absence of other parts of urinary tract: Secondary | ICD-10-CM | POA: Diagnosis present

## 2014-08-31 DIAGNOSIS — E119 Type 2 diabetes mellitus without complications: Secondary | ICD-10-CM | POA: Diagnosis present

## 2014-08-31 DIAGNOSIS — N179 Acute kidney failure, unspecified: Secondary | ICD-10-CM | POA: Diagnosis present

## 2014-08-31 DIAGNOSIS — C679 Malignant neoplasm of bladder, unspecified: Secondary | ICD-10-CM | POA: Diagnosis present

## 2014-08-31 DIAGNOSIS — I129 Hypertensive chronic kidney disease with stage 1 through stage 4 chronic kidney disease, or unspecified chronic kidney disease: Secondary | ICD-10-CM | POA: Diagnosis present

## 2014-08-31 DIAGNOSIS — E860 Dehydration: Secondary | ICD-10-CM | POA: Diagnosis present

## 2014-08-31 DIAGNOSIS — Z87891 Personal history of nicotine dependence: Secondary | ICD-10-CM | POA: Diagnosis not present

## 2014-08-31 DIAGNOSIS — Z789 Other specified health status: Secondary | ICD-10-CM | POA: Diagnosis not present

## 2014-08-31 DIAGNOSIS — E876 Hypokalemia: Secondary | ICD-10-CM | POA: Diagnosis present

## 2014-08-31 DIAGNOSIS — Z8551 Personal history of malignant neoplasm of bladder: Secondary | ICD-10-CM

## 2014-08-31 DIAGNOSIS — Z515 Encounter for palliative care: Secondary | ICD-10-CM

## 2014-08-31 DIAGNOSIS — Z79899 Other long term (current) drug therapy: Secondary | ICD-10-CM

## 2014-08-31 DIAGNOSIS — R4182 Altered mental status, unspecified: Secondary | ICD-10-CM | POA: Diagnosis not present

## 2014-08-31 DIAGNOSIS — N390 Urinary tract infection, site not specified: Secondary | ICD-10-CM | POA: Diagnosis present

## 2014-08-31 DIAGNOSIS — I10 Essential (primary) hypertension: Secondary | ICD-10-CM | POA: Diagnosis present

## 2014-08-31 DIAGNOSIS — N3000 Acute cystitis without hematuria: Secondary | ICD-10-CM | POA: Diagnosis not present

## 2014-08-31 DIAGNOSIS — R2681 Unsteadiness on feet: Secondary | ICD-10-CM | POA: Diagnosis not present

## 2014-08-31 DIAGNOSIS — R531 Weakness: Secondary | ICD-10-CM

## 2014-08-31 DIAGNOSIS — M6281 Muscle weakness (generalized): Secondary | ICD-10-CM | POA: Diagnosis not present

## 2014-08-31 LAB — DIFFERENTIAL
BASOS PCT: 0 % (ref 0–1)
Basophils Absolute: 0 10*3/uL (ref 0.0–0.1)
EOS PCT: 2 % (ref 0–5)
Eosinophils Absolute: 0.2 10*3/uL (ref 0.0–0.7)
LYMPHS PCT: 23 % (ref 12–46)
Lymphs Abs: 2.5 10*3/uL (ref 0.7–4.0)
Monocytes Absolute: 1.4 10*3/uL — ABNORMAL HIGH (ref 0.1–1.0)
Monocytes Relative: 13 % — ABNORMAL HIGH (ref 3–12)
Neutro Abs: 6.5 10*3/uL (ref 1.7–7.7)
Neutrophils Relative %: 62 % (ref 43–77)
SMEAR REVIEW: ADEQUATE

## 2014-08-31 LAB — RAPID URINE DRUG SCREEN, HOSP PERFORMED
AMPHETAMINES: NOT DETECTED
BENZODIAZEPINES: NOT DETECTED
Barbiturates: NOT DETECTED
Cocaine: NOT DETECTED
Opiates: NOT DETECTED
TETRAHYDROCANNABINOL: NOT DETECTED

## 2014-08-31 LAB — COMPREHENSIVE METABOLIC PANEL
ALBUMIN: 3.8 g/dL (ref 3.5–5.0)
ALT: 23 U/L (ref 14–54)
ANION GAP: 9 (ref 5–15)
AST: 26 U/L (ref 15–41)
Alkaline Phosphatase: 63 U/L (ref 38–126)
BUN: 41 mg/dL — AB (ref 6–20)
CHLORIDE: 99 mmol/L — AB (ref 101–111)
CO2: 27 mmol/L (ref 22–32)
Calcium: 11.5 mg/dL — ABNORMAL HIGH (ref 8.9–10.3)
Creatinine, Ser: 1.07 mg/dL — ABNORMAL HIGH (ref 0.44–1.00)
GFR calc Af Amer: 52 mL/min — ABNORMAL LOW (ref >60)
GFR calc non Af Amer: 45 mL/min — ABNORMAL LOW (ref >60)
GLUCOSE: 121 mg/dL — AB (ref 65–99)
POTASSIUM: 3.4 mmol/L — AB (ref 3.5–5.1)
SODIUM: 135 mmol/L (ref 135–145)
Total Bilirubin: 0.8 mg/dL (ref 0.3–1.2)
Total Protein: 8 g/dL (ref 6.5–8.1)

## 2014-08-31 LAB — URINALYSIS, ROUTINE W REFLEX MICROSCOPIC
BILIRUBIN URINE: NEGATIVE
GLUCOSE, UA: NEGATIVE mg/dL
HGB URINE DIPSTICK: NEGATIVE
Ketones, ur: NEGATIVE mg/dL
Nitrite: NEGATIVE
PROTEIN: 30 mg/dL — AB
Specific Gravity, Urine: 1.014 (ref 1.005–1.030)
UROBILINOGEN UA: 0.2 mg/dL (ref 0.0–1.0)
pH: 7 (ref 5.0–8.0)

## 2014-08-31 LAB — URINE MICROSCOPIC-ADD ON

## 2014-08-31 LAB — TSH: TSH: 1.741 u[IU]/mL (ref 0.350–4.500)

## 2014-08-31 LAB — AMMONIA: Ammonia: 14 umol/L (ref 9–35)

## 2014-08-31 LAB — APTT: APTT: 35 s (ref 24–37)

## 2014-08-31 LAB — ETHANOL

## 2014-08-31 LAB — PROTIME-INR
INR: 1.04 (ref 0.00–1.49)
PROTHROMBIN TIME: 13.8 s (ref 11.6–15.2)

## 2014-08-31 MED ORDER — SODIUM CHLORIDE 0.9 % IV SOLN
INTRAVENOUS | Status: AC
  Administered 2014-09-01 (×2): via INTRAVENOUS

## 2014-08-31 MED ORDER — LOSARTAN POTASSIUM 50 MG PO TABS
100.0000 mg | ORAL_TABLET | Freq: Every day | ORAL | Status: DC
  Administered 2014-09-01 – 2014-09-03 (×3): 100 mg via ORAL
  Filled 2014-08-31 (×3): qty 2

## 2014-08-31 MED ORDER — DEXTROSE 5 % IV SOLN
1.0000 g | Freq: Every day | INTRAVENOUS | Status: DC
  Administered 2014-09-01 – 2014-09-02 (×3): 1 g via INTRAVENOUS
  Filled 2014-08-31 (×3): qty 10

## 2014-08-31 MED ORDER — POTASSIUM CHLORIDE CRYS ER 20 MEQ PO TBCR
40.0000 meq | EXTENDED_RELEASE_TABLET | Freq: Once | ORAL | Status: AC
  Administered 2014-08-31: 40 meq via ORAL
  Filled 2014-08-31: qty 2

## 2014-08-31 MED ORDER — TRAMADOL HCL 50 MG PO TABS: 50.0000 mg | ORAL_TABLET | Freq: Four times a day (QID) | ORAL | Status: DC | PRN

## 2014-08-31 MED ORDER — SACCHAROMYCES BOULARDII 250 MG PO CAPS
250.0000 mg | ORAL_CAPSULE | Freq: Two times a day (BID) | ORAL | Status: DC
  Administered 2014-09-01 – 2014-09-03 (×6): 250 mg via ORAL
  Filled 2014-08-31 (×6): qty 1

## 2014-08-31 MED ORDER — LEVOTHYROXINE SODIUM 88 MCG PO TABS
88.0000 ug | ORAL_TABLET | Freq: Every day | ORAL | Status: DC
  Administered 2014-09-01 – 2014-09-03 (×3): 88 ug via ORAL
  Filled 2014-08-31 (×3): qty 1

## 2014-08-31 MED ORDER — ONDANSETRON HCL 4 MG PO TABS: 4.0000 mg | ORAL_TABLET | Freq: Three times a day (TID) | ORAL | Status: DC | PRN

## 2014-08-31 MED ORDER — ENOXAPARIN SODIUM 40 MG/0.4ML ~~LOC~~ SOLN
40.0000 mg | Freq: Every day | SUBCUTANEOUS | Status: DC
  Administered 2014-09-01 – 2014-09-02 (×3): 40 mg via SUBCUTANEOUS
  Filled 2014-08-31 (×3): qty 0.4

## 2014-08-31 MED ORDER — ASPIRIN 81 MG PO CHEW
324.0000 mg | CHEWABLE_TABLET | Freq: Once | ORAL | Status: AC
  Administered 2014-08-31: 324 mg via ORAL
  Filled 2014-08-31: qty 4

## 2014-08-31 MED ORDER — SENNOSIDES-DOCUSATE SODIUM 8.6-50 MG PO TABS: 1.0000 | ORAL_TABLET | Freq: Every evening | ORAL | Status: DC | PRN

## 2014-08-31 NOTE — Progress Notes (Signed)
ANTIBIOTIC CONSULT NOTE - INITIAL  Pharmacy Consult for Ceftriaxone Indication: UTI  Allergies  Allergen Reactions  . Diovan [Valsartan] Itching  . Codeine Nausea And Vomiting    Patient Measurements: Height: 5\' 7"  (170.2 cm) Weight: 119 lb 14.9 oz (54.4 kg) IBW/kg (Calculated) : 61.6 Adjusted Body Weight:   Vital Signs: Temp: 98.1 F (36.7 C) (08/22 2153) Temp Source: Oral (08/22 2153) BP: 108/61 mmHg (08/22 2153) Pulse Rate: 70 (08/22 2153) Intake/Output from previous day:   Intake/Output from this shift:    Labs:  Recent Labs  08/31/14 1826  WBC 10.7*  HGB 12.6  PLT 79*  CREATININE 1.07*   Estimated Creatinine Clearance: 30.6 mL/min (by C-G formula based on Cr of 1.07). No results for input(s): VANCOTROUGH, VANCOPEAK, VANCORANDOM, GENTTROUGH, GENTPEAK, GENTRANDOM, TOBRATROUGH, TOBRAPEAK, TOBRARND, AMIKACINPEAK, AMIKACINTROU, AMIKACIN in the last 72 hours.   Microbiology: No results found for this or any previous visit (from the past 720 hour(s)).  Medical History: Past Medical History  Diagnosis Date  . Hx of bronchitis     age 81   . Boils     hx of  years ago   . Hypertension   . Dysrhythmia     occasional skip   . Hypothyroidism   . Diabetes mellitus without complication     borderline on no meds   . Anxiety   . Cancer     hx of skin cancer on face     Medications:  Anti-infectives    Start     Dose/Rate Route Frequency Ordered Stop   08/31/14 2245  cefTRIAXone (ROCEPHIN) 1 g in dextrose 5 % 50 mL IVPB     1 g 100 mL/hr over 30 Minutes Intravenous Daily at bedtime 08/31/14 2240       Assessment: Patient with UTI.  Goal of Therapy:  Rocephin based on manufacturer dosing recommendations.   Plan: Ceftriaxone 1gm iv q24hr Follow up culture results  Nani Skillern Crowford 08/31/2014,10:40 PM

## 2014-08-31 NOTE — ED Notes (Signed)
Urostomy bag is cleaning.  Will change once supplies comes to ED.

## 2014-08-31 NOTE — H&P (Addendum)
[facility] History and Physical  TATJANA TURCOTT QZE:[id number] DOB: 1922/03/07 DOA: 08/31/2014  Referring physician: Dr. Cathleen Fears. PCP: Horatio Pel, MD  Specialists: Dr. Alexis Frock. Urologist.  Chief Complaint: Weakness. And confusion.  HPI: Victoria Patrick is a 81 y.o. female with history of hypertension, hypothyroidism and recent surgery for bladder cancer with ileal conduit in June of this year was brought to the ER after patient was found to be increasingly weak and confused. As per patient's family was at the bedside patient's neighbor noticed that patient has become increasingly bedridden and weak and not eating well over the last 1 week with periods of confusion. Patient's family arrived from [place] and brought patient to the ER. Patient's blood work shows low chloride and hypercalcemia. Patient denies any nausea vomiting diarrhea abdominal pain chest pain shortness of breath. Chest x-ray did not show anything acute and CT head was unremarkable. On exam patient is nonfocal. Patient has been started on IV fluids and UA shows possible UTI for which antibiotics has been started and admitted for further management.   Review of Systems: As presented in the history of presenting illness, rest negative.  Past Medical History  Diagnosis Date  . Hx of bronchitis     age 55   . Boils     hx of  years ago   . Hypertension   . Dysrhythmia     occasional skip   . Hypothyroidism   . Diabetes mellitus without complication     borderline on no meds   . Anxiety   . Cancer     hx of skin cancer on face    Past Surgical History  Procedure Laterality Date  . Hx of skin cancer removal      locally done   . Cystoscopy N/A 04/30/2014    Procedure: CYSTOSCOPY;  Surgeon: Irine Seal, MD;  Location: [facility];  Service: Urology;  Laterality: N/A;  . Transurethral resection of bladder tumor N/A 04/30/2014    Procedure: TRANSURETHRAL RESECTION OF BLADDER TUMOR (TURBT);   Surgeon: Irine Seal, MD;  Location: [facility];  Service: Urology;  Laterality: N/A;  . Robot assisted laparoscopic complete cystect ileal conduit N/A 06/26/2014    Procedure: ROBOTIC ASSISTED LAPAROSCOPIC COMPLETE CYSTECT ILEAL CONDUIT;  Surgeon: Alexis Frock, MD;  Location: [facility];  Service: Urology;  Laterality: N/A;  . Robotic assisted laparoscopic hysterectomy and salpingectomy N/A 06/26/2014    Procedure: ROBOTIC ASSISTED LAPAROSCOPIC HYSTERECTOMY AND BILATERAL SALPINGO-OPHERECTOMY;  Surgeon: Alexis Frock, MD;  Location: [facility];  Service: Urology;  Laterality: N/A;  . Cystoscopy with injection N/A 06/26/2014    Procedure: CYSTOSCOPY WITH INJECTION OF INDOCYANINE GREEN DYE;  Surgeon: Alexis Frock, MD;  Location: [facility];  Service: Urology;  Laterality: N/A;   Social History:  reports that she has quit smoking. She has never used smokeless tobacco. She reports that she drinks alcohol. She reports that she does not use illicit drugs. Where does patient live home. Can patient participate in ADLs? Yes.  Allergies  Allergen Reactions  . Diovan [Valsartan] Itching  . Codeine Nausea And Vomiting    Family History:  Family History  Problem Relation Age of Onset  . Bladder Cancer Neg Hx   . Stroke Maternal Aunt       Prior to Admission medications   Medication Sig Start Date End Date Taking? Authorizing Provider  losartan-hydrochlorothiazide (HYZAAR) 100-12.5 MG per tablet Take 1 tablet by mouth every morning.    Yes Historical Provider, MD  SYNTHROID 88 MCG tablet Take 88 mcg by mouth every morning.  03/19/13  Yes Historical Provider, MD  ondansetron (ZOFRAN) 4 MG tablet Take 1 tablet (4 mg total) by mouth every 8 (eight) hours as needed for nausea or vomiting. Patient not taking: Reported on 06/25/2014 04/30/14   Irine Seal, MD  senna-docusate (SENOKOT-S) 8.6-50 MG per tablet Take 1 tablet by mouth 2 (two) times daily. While taking pain meds to prevent constipation Patient not taking:  Reported on 08/31/2014 07/02/14   Alexis Frock, MD  traMADol (ULTRAM) 50 MG tablet Take 1-2 tablets (50-100 mg total) by mouth every 6 (six) hours as needed for moderate pain. Post-operatively Patient not taking: Reported on 08/31/2014 07/02/14   Alexis Frock, MD    Physical Exam: Filed Vitals:   08/31/14 1800 08/31/14 1826 08/31/14 2055 08/31/14 2153  BP: 107/64  114/62 108/61  Pulse: 84  79 70  Temp:  97.7 F (36.5 C)  98.1 F (36.7 C)  TempSrc:    Oral  Resp: 17  20 20   Height:    5\' 7"  (1.702 m)  Weight:    54.4 kg (119 lb 14.9 oz)  SpO2: 97%  100% 99%     General:  Moderately built and poorly nourished.  Eyes: Anicteric no pallor.  ENT: No discharge from the ears eyes nose and mouth.  Neck: No mass felt.  Cardiovascular: S1 and S2 heard.  Respiratory: No rhonchi or crepitations.  Abdomen: Ileal conduit seen. Nontender bowel sounds present. No guarding or rigidity.  Skin: No rash.  Musculoskeletal: No edema.  Psychiatric: Appears normal.  Neurologic: Alert oriented to time place and person. Moves all extremities 5 x 5. No facial asymmetry. Tongue was midline.  Labs on Admission:  Basic Metabolic Panel:  Recent Labs Lab 08/31/14 1826  NA 135  K 3.4*  CL 99*  CO2 27  GLUCOSE 121*  BUN 41*  CREATININE 1.07*  CALCIUM 11.5*   Liver Function Tests:  Recent Labs Lab 08/31/14 1826  AST 26  ALT 23  ALKPHOS 63  BILITOT 0.8  PROT 8.0  ALBUMIN 3.8   No results for input(s): LIPASE, AMYLASE in the last 168 hours.  Recent Labs Lab 08/31/14 1850  AMMONIA 14   CBC:  Recent Labs Lab 08/31/14 1826  WBC 10.7*  NEUTROABS 6.5  HGB 12.6  HCT 38.4  MCV 85.9  PLT 79*   Cardiac Enzymes: No results for input(s): CKTOTAL, CKMB, CKMBINDEX, TROPONINI in the last 168 hours.  BNP (last 3 results) No results for input(s): BNP in the last 8760 hours.  ProBNP (last 3 results) No results for input(s): PROBNP in the last 8760 hours.  CBG: No results  for input(s): GLUCAP in the last 168 hours.  Radiological Exams on Admission: Ct Head Wo Contrast  08/31/2014   CLINICAL DATA:  81 year old female with altered mental status. Symptoms following bladder surgery earlier this month. Initial encounter. Current history of bladder cancer.  EXAM: CT HEAD WITHOUT CONTRAST  TECHNIQUE: Contiguous axial images were obtained from the base of the skull through the vertex without intravenous contrast.  COMPARISON:  None.  FINDINGS: Visualized paranasal sinuses and mastoids are clear. No acute or suspicious calvarium lesion identified. No acute scalp or orbits soft tissue findings.  Calcified atherosclerosis at the skull base. Cerebral volume is within normal limits for age. No ventriculomegaly. No midline shift, mass effect, or evidence of intracranial mass lesion. Gray-white matter differentiation is within normal limits for age. No cortically based  acute infarct identified. No acute intracranial hemorrhage identified. No suspicious intracranial vascular hyperdensity.  IMPRESSION: Negative for age Normal noncontrast CT appearance of the brain.   Electronically Signed   By: Genevie Ann M.D.   On: 08/31/2014 19:18    EKG: Independently reviewed. Normal sinus rhythm with incomplete LBBB.  Assessment/Plan Principal Problem:   Weakness generalized Active Problems:   Bladder cancer   Delirium   Hypercalcemia   UTI (urinary tract infection)   Essential hypertension   Hypothyroidism   Weakness   1. Generalized weakness probably from dehydration and poor oral intake - patient has been placed on gentle hydration. Close the following day of metabolic panel. Patient probably will need metastatic workup before intake is poor. 2. Hypercalcemia probably from dehydration - I'm holding off patient's HCTZ. Gently hydrating and follow metabolic panel. Patient may need further workup with calcium does not improve. 3. Delirium/acute encephalopathy - patient's family noticed that  patient was having periods of confusion. On exam patient is presently while awake oriented and follows commands. Ammonia levels are within acceptable limits. CT head is unremarkable. MRI brain is pending. 4. Hypothyroidism - continue Synthroid check TSH. 5. Hypertension - holding off HCTZ due to dehydration and hypercalcemia. Continue Cozaar. 6. Probable chronic kidney disease stage III - creatinine has had increased from June 2016. Gently hydrate him closely monitor metabolic bowel. 7. Bladder cancer status post surgery with ileal conduit - per urologist. 8. Mild thrombocytopenia - follow CBC.  Hospice has been consulted. I have reviewed patient's old charts on labs. Personally reviewed patient's chest x-ray and EKG.   DVT Prophylaxis Lovenox.  Code Status: DO NOT RESUSCITATE as confirmed with patient and patient's family.  Family Communication: Discussed with patient's family.  Disposition Plan: Admit to inpatient.    Jaimya Feliciano N. [facility] Pager [phone].  If 7PM-7AM, please contact night-coverage [email or web address] Password [facility] 08/31/2014, 10:33 PM

## 2014-08-31 NOTE — ED Provider Notes (Signed)
CSN: [id number]     Arrival date & time 08/31/14  1626 History   First MD Initiated Contact with Patient 08/31/14 1752     Chief Complaint  Patient presents with  . Confusion   . decreased ADLs      (Consider location/radiation/quality/duration/timing/severity/associated sxs/prior Treatment) HPI Level V caveat altered mental status. History is obtained from her niece and daughter-in-law who accompany her. Patient has been confused sudden onset one week ago. She has also had diminished appetite. Gait is been unsteady. She had 2 episodes of diarrhea after treatment with laxity. No treatment prior to coming here. No fever. Patient presently offers no complaint. Seen by Dr.Pharr today who sent her here for further evaluation Past Medical History  Diagnosis Date  . Hx of bronchitis     age 81   . Boils     hx of  years ago   . Hypertension   . Dysrhythmia     occasional skip   . Hypothyroidism   . Diabetes mellitus without complication     borderline on no meds   . Anxiety   . Cancer     hx of skin cancer on face    Past Surgical History  Procedure Laterality Date  . Hx of skin cancer removal      locally done   . Cystoscopy N/A 04/30/2014    Procedure: CYSTOSCOPY;  Surgeon: Irine Seal, MD;  Location: [facility];  Service: Urology;  Laterality: N/A;  . Transurethral resection of bladder tumor N/A 04/30/2014    Procedure: TRANSURETHRAL RESECTION OF BLADDER TUMOR (TURBT);  Surgeon: Irine Seal, MD;  Location: [facility];  Service: Urology;  Laterality: N/A;  . Robot assisted laparoscopic complete cystect ileal conduit N/A 06/26/2014    Procedure: ROBOTIC ASSISTED LAPAROSCOPIC COMPLETE CYSTECT ILEAL CONDUIT;  Surgeon: Alexis Frock, MD;  Location: [facility];  Service: Urology;  Laterality: N/A;  . Robotic assisted laparoscopic hysterectomy and salpingectomy N/A 06/26/2014    Procedure: ROBOTIC ASSISTED LAPAROSCOPIC HYSTERECTOMY AND BILATERAL SALPINGO-OPHERECTOMY;  Surgeon: Alexis Frock, MD;   Location: [facility];  Service: Urology;  Laterality: N/A;  . Cystoscopy with injection N/A 06/26/2014    Procedure: CYSTOSCOPY WITH INJECTION OF INDOCYANINE GREEN DYE;  Surgeon: Alexis Frock, MD;  Location: [facility];  Service: Urology;  Laterality: N/A;   No family history on file. Social History  Substance Use Topics  . Smoking status: Former Research scientist (life sciences)  . Smokeless tobacco: Never Used  . Alcohol Use: Yes     Comment: occasional glass of wine none since 12/2013    OB History    No data available     Review of Systems  Unable to perform ROS: Mental status change  Gastrointestinal: Positive for diarrhea.       2 episodes diarrhea after being given the laxity of      Allergies  Diovan and Codeine  Home Medications   Prior to Admission medications   Medication Sig Start Date End Date Taking? Authorizing Provider  losartan-hydrochlorothiazide (HYZAAR) 100-12.5 MG per tablet Take 1 tablet by mouth every morning.     Historical Provider, MD  ondansetron (ZOFRAN) 4 MG tablet Take 1 tablet (4 mg total) by mouth every 8 (eight) hours as needed for nausea or vomiting. Patient not taking: Reported on 06/25/2014 04/30/14   Irine Seal, MD  senna-docusate (SENOKOT-S) 8.6-50 MG per tablet Take 1 tablet by mouth 2 (two) times daily. While taking pain meds to prevent constipation 07/02/14   Alexis Frock, MD  SYNTHROID  88 MCG tablet Take 88 mcg by mouth every morning.  03/19/13   Historical Provider, MD  traMADol (ULTRAM) 50 MG tablet Take 1-2 tablets (50-100 mg total) by mouth every 6 (six) hours as needed for moderate pain. Post-operatively 07/02/14   Alexis Frock, MD   BP 106/59 mmHg  Pulse 102  Temp(Src) 97.3 F (36.3 C) (Oral)  Resp 18  Ht 5\' 7"  (1.702 m)  Wt 120 lb (54.432 kg)  BMI 18.79 kg/m2  SpO2 100% Physical Exam  Constitutional: She appears well-developed and well-nourished.  HENT:  Head: Normocephalic and atraumatic.  Eyes: Conjunctivae are normal. Pupils are equal, round, and  reactive to light.  Neck: Neck supple. No tracheal deviation present. No thyromegaly present.  Cardiovascular: Normal rate and regular rhythm.   No murmur heard. Pulmonary/Chest: Effort normal and breath sounds normal.  Abdominal: Soft. Bowel sounds are normal. She exhibits no distension. There is no tenderness.  Musculoskeletal: Normal range of motion. She exhibits no edema or tenderness.  Neurological: She is alert. She has normal reflexes. No cranial nerve deficit. Coordination normal.  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  Skin: Skin is warm and dry. No rash noted.  Psychiatric: She has a normal mood and affect.  Nursing note and vitals reviewed.   ED Course  Procedures (including critical care time) Labs Review Labs Reviewed - No data to display  Imaging Review No results found. I have personally reviewed and evaluated these images and lab results as part of my medical decision-making.   EKG Interpretation   Date/Time:  Monday August 31 2014 18:18:12 EDT Ventricular Rate:  89 PR Interval:  130 QRS Duration: 114 QT Interval:  365 QTC Calculation: 444 R Axis:   -40 Text Interpretation:  Sinus rhythm Incomplete left bundle branch block  Anterior Q waves, possibly due to ILBBB Baseline wander in lead(s) V5 No  old tracing to compare Confirmed by Winfred Leeds  MD, Cordai Rodrigue [phone]) on  08/31/2014 7:37:42 PM     8 PM patient resting comfortably. Results for orders placed or performed during the hospital encounter of 08/31/14  Ethanol  Result Value Ref Range   Alcohol, Ethyl (B) <5 <5 mg/dL  Protime-INR  Result Value Ref Range   Prothrombin Time 13.8 11.6 - 15.2 seconds   INR 1.04 0.00 - 1.49  APTT  Result Value Ref Range   aPTT 35 24 - 37 seconds  CBC  Result Value Ref Range   WBC 10.7 (H) 4.0 - 10.5 K/uL   RBC 4.47 3.87 - 5.11 MIL/uL   Hemoglobin 12.6 12.0 - 15.0 g/dL   HCT 38.4 36.0 - 46.0 %   MCV 85.9 78.0 - 100.0 fL    MCH 28.2 26.0 - 34.0 pg   MCHC 32.8 30.0 - 36.0 g/dL   RDW 14.6 11.5 - 15.5 %   Platelets 79 (L) 150 - 400 K/uL  Differential  Result Value Ref Range   Neutro Abs 6.5 1.7 - 7.7 K/uL   Lymphs Abs 2.5 0.7 - 4.0 K/uL   Monocytes Absolute 1.4 (H) 0.1 - 1.0 K/uL   Eosinophils Absolute 0.2 0.0 - 0.7 K/uL   Basophils Absolute 0.0 0.0 - 0.1 K/uL   Neutrophils Relative % 62 43 - 77 %   Lymphocytes Relative 23 12 - 46 %   Monocytes Relative 13 (H) 3 - 12 %   Eosinophils Relative 2 0 - 5 %   Basophils Relative 0 0 -  1 %   RBC Morphology POLYCHROMASIA PRESENT    Smear Review      PLATELET CLUMPS NOTED ON SMEAR, COUNT APPEARS ADEQUATE  Comprehensive metabolic panel  Result Value Ref Range   Sodium 135 135 - 145 mmol/L   Potassium 3.4 (L) 3.5 - 5.1 mmol/L   Chloride 99 (L) 101 - 111 mmol/L   CO2 27 22 - 32 mmol/L   Glucose, Bld 121 (H) 65 - 99 mg/dL   BUN 41 (H) 6 - 20 mg/dL   Creatinine, Ser 1.07 (H) 0.44 - 1.00 mg/dL   Calcium 11.5 (H) 8.9 - 10.3 mg/dL   Total Protein 8.0 6.5 - 8.1 g/dL   Albumin 3.8 3.5 - 5.0 g/dL   AST 26 15 - 41 U/L   ALT 23 14 - 54 U/L   Alkaline Phosphatase 63 38 - 126 U/L   Total Bilirubin 0.8 0.3 - 1.2 mg/dL   GFR calc non Af Amer 45 (L) >60 mL/min   GFR calc Af Amer 52 (L) >60 mL/min   Anion gap 9 5 - 15  Urine rapid drug screen (hosp performed)not at [facility]  Result Value Ref Range   Opiates NONE DETECTED NONE DETECTED   Cocaine NONE DETECTED NONE DETECTED   Benzodiazepines NONE DETECTED NONE DETECTED   Amphetamines NONE DETECTED NONE DETECTED   Tetrahydrocannabinol NONE DETECTED NONE DETECTED   Barbiturates NONE DETECTED NONE DETECTED  Urinalysis, Routine w reflex microscopic (not at [facility])  Result Value Ref Range   Color, Urine YELLOW YELLOW   APPearance CLOUDY (A) CLEAR   Specific Gravity, Urine 1.014 1.005 - 1.030   pH 7.0 5.0 - 8.0   Glucose, UA NEGATIVE NEGATIVE mg/dL   Hgb urine dipstick NEGATIVE NEGATIVE   Bilirubin Urine NEGATIVE NEGATIVE    Ketones, ur NEGATIVE NEGATIVE mg/dL   Protein, ur 30 (A) NEGATIVE mg/dL   Urobilinogen, UA 0.2 0.0 - 1.0 mg/dL   Nitrite NEGATIVE NEGATIVE   Leukocytes, UA SMALL (A) NEGATIVE  TSH  Result Value Ref Range   TSH 1.741 0.350 - 4.500 uIU/mL  Ammonia  Result Value Ref Range   Ammonia 14 9 - 35 umol/L  Urine microscopic-add on  Result Value Ref Range   WBC, UA 7-10 <3 WBC/hpf   Bacteria, UA FEW (A) RARE   Ct Head Wo Contrast  08/31/2014   CLINICAL DATA:  81 year old female with altered mental status. Symptoms following bladder surgery earlier this month. Initial encounter. Current history of bladder cancer.  EXAM: CT HEAD WITHOUT CONTRAST  TECHNIQUE: Contiguous axial images were obtained from the base of the skull through the vertex without intravenous contrast.  COMPARISON:  None.  FINDINGS: Visualized paranasal sinuses and mastoids are clear. No acute or suspicious calvarium lesion identified. No acute scalp or orbits soft tissue findings.  Calcified atherosclerosis at the skull base. Cerebral volume is within normal limits for age. No ventriculomegaly. No midline shift, mass effect, or evidence of intracranial mass lesion. Gray-white matter differentiation is within normal limits for age. No cortically based acute infarct identified. No acute intracranial hemorrhage identified. No suspicious intracranial vascular hyperdensity.  IMPRESSION: Negative for age Normal noncontrast CT appearance of the brain.   Electronically Signed   By: Genevie Ann M.D.   On: 08/31/2014 19:18    MDM  I spoke with Dr. Hal Hope plan admit telemetry Consider MRI, neurology consult. Treat empirically with aspirin as subacute stroke possibility Diagnosis #1acute delirium #2 hypokalemia #3 renal insufficiency Final diagnoses:  None  Orlie Dakin, MD 08/31/14 2009

## 2014-08-31 NOTE — ED Notes (Signed)
Pt sent to ED from PCP, not says that "since about August 10th, several weeks after extensive bladder surgery pt developed low energy, stayed in bed, poor appetite, and incontience of feces. She drove some before the 10th, so she did not have home health after her decline. No fever or chills. She only gets up to go to the bathroom. She has refused to wear adult diapers despite this. 2 falls. Did not hit her head. No numbness, tingling, or weakness. No seizures or syncope.   Upon rn assessment, pt unable to say what day it is, who the president is, or what 5+5 is.

## 2014-09-01 ENCOUNTER — Inpatient Hospital Stay (HOSPITAL_COMMUNITY): Payer: Medicare Other

## 2014-09-01 DIAGNOSIS — R531 Weakness: Secondary | ICD-10-CM

## 2014-09-01 DIAGNOSIS — I10 Essential (primary) hypertension: Secondary | ICD-10-CM

## 2014-09-01 DIAGNOSIS — Z789 Other specified health status: Secondary | ICD-10-CM

## 2014-09-01 DIAGNOSIS — R410 Disorientation, unspecified: Secondary | ICD-10-CM

## 2014-09-01 DIAGNOSIS — Z7189 Other specified counseling: Secondary | ICD-10-CM

## 2014-09-01 DIAGNOSIS — C679 Malignant neoplasm of bladder, unspecified: Secondary | ICD-10-CM

## 2014-09-01 LAB — CBC
HCT: 38.4 % (ref 36.0–46.0)
HEMATOCRIT: 33.4 % — AB (ref 36.0–46.0)
Hemoglobin: 12.6 g/dL (ref 12.0–15.0)
Hemoglobin: 10.9 g/dL — ABNORMAL LOW (ref 12.0–15.0)
MCH: 28.2 pg (ref 26.0–34.0)
MCH: 28.1 pg (ref 26.0–34.0)
MCHC: 32.8 g/dL (ref 30.0–36.0)
MCHC: 32.6 g/dL (ref 30.0–36.0)
MCV: 85.9 fL (ref 78.0–100.0)
MCV: 86.1 fL (ref 78.0–100.0)
Platelets: ADEQUATE 10*3/uL (ref 150–400)
Platelets: 138 10*3/uL — ABNORMAL LOW (ref 150–400)
RBC: 4.47 MIL/uL (ref 3.87–5.11)
RBC: 3.88 MIL/uL (ref 3.87–5.11)
RDW: 14.6 % (ref 11.5–15.5)
RDW: 14.8 % (ref 11.5–15.5)
WBC: 10.7 10*3/uL — ABNORMAL HIGH (ref 4.0–10.5)
WBC: 9.1 10*3/uL (ref 4.0–10.5)

## 2014-09-01 LAB — COMPREHENSIVE METABOLIC PANEL
ALK PHOS: 46 U/L (ref 38–126)
ALT: 16 U/L (ref 14–54)
AST: 16 U/L (ref 15–41)
Albumin: 2.7 g/dL — ABNORMAL LOW (ref 3.5–5.0)
Anion gap: 9 (ref 5–15)
BUN: 41 mg/dL — AB (ref 6–20)
CHLORIDE: 103 mmol/L (ref 101–111)
CO2: 24 mmol/L (ref 22–32)
CREATININE: 0.98 mg/dL (ref 0.44–1.00)
Calcium: 9.9 mg/dL (ref 8.9–10.3)
GFR calc Af Amer: 58 mL/min — ABNORMAL LOW (ref >60)
GFR calc non Af Amer: 50 mL/min — ABNORMAL LOW (ref >60)
GLUCOSE: 96 mg/dL (ref 65–99)
Potassium: 3.5 mmol/L (ref 3.5–5.1)
SODIUM: 136 mmol/L (ref 135–145)
Total Bilirubin: 0.7 mg/dL (ref 0.3–1.2)
Total Protein: 5.6 g/dL — ABNORMAL LOW (ref 6.5–8.1)

## 2014-09-01 LAB — URINALYSIS, ROUTINE W REFLEX MICROSCOPIC
Bilirubin Urine: NEGATIVE
GLUCOSE, UA: NEGATIVE mg/dL
Hgb urine dipstick: NEGATIVE
Ketones, ur: NEGATIVE mg/dL
Nitrite: NEGATIVE
PROTEIN: NEGATIVE mg/dL
SPECIFIC GRAVITY, URINE: 1.015 (ref 1.005–1.030)
UROBILINOGEN UA: 0.2 mg/dL (ref 0.0–1.0)
pH: 6.5 (ref 5.0–8.0)

## 2014-09-01 LAB — CK: Total CK: 14 U/L — ABNORMAL LOW (ref 38–234)

## 2014-09-01 LAB — URINE MICROSCOPIC-ADD ON

## 2014-09-01 LAB — FOLATE: FOLATE: 18.9 ng/mL (ref >5.9)

## 2014-09-01 LAB — CREATININE, SERUM
Creatinine, Ser: 1.14 mg/dL — ABNORMAL HIGH (ref 0.44–1.00)
GFR calc non Af Amer: 41 mL/min — ABNORMAL LOW (ref >60)
GFR, EST AFRICAN AMERICAN: 48 mL/min — AB (ref >60)

## 2014-09-01 LAB — VITAMIN B12: VITAMIN B 12: 506 pg/mL (ref 180–914)

## 2014-09-01 LAB — TROPONIN I: Troponin I: <0.03 ng/mL (ref <0.031)

## 2014-09-01 LAB — TSH: TSH: 1.326 u[IU]/mL (ref 0.350–4.500)

## 2014-09-01 MED ORDER — ENSURE ENLIVE PO LIQD
237.0000 mL | Freq: Two times a day (BID) | ORAL | Status: DC
  Administered 2014-09-01 – 2014-09-03 (×4): 237 mL via ORAL

## 2014-09-01 NOTE — Care Management Note (Signed)
Case Management Note  Patient Details  Name: Victoria Patrick MRN: [id number] Date of Birth: Oct 08, 1922  Subjective/Objective:  81 y/o f admitted w/AMS.From [place] of state[place].PT/OT, & PMT cons.Await recommendations.                  Action/Plan:Monitor progess for d/c needs.   Expected Discharge Date:                  Expected Discharge Plan:  [place]  In-House Referral:  Clinical Social Work  Discharge planning Services  CM Consult  Post Acute Care Choice:    Choice offered to:     DME Arranged:    DME Agency:     HH Arranged:    [place] Agency:     Status of Service:  In process, will continue to follow  Medicare Important Message Given:    Date Medicare IM Given:    Medicare IM give by:    Date Additional Medicare IM Given:    Additional Medicare Important Message give by:     If discussed at [place] of Stay Meetings, dates discussed:    Additional Comments:  Dessa Phi, RN 09/01/2014, 1:54 PM

## 2014-09-01 NOTE — Progress Notes (Addendum)
Pharmacy: Re-ceftriaxone  Patient's an 81 y.o F with ceftriaxone started on 8/22 for UTI.  - afeb, wbc wnl, scr 0.98  - ucx pending  Plan: - continue ceftriaxone 1gm IV q24h - pharmacy will sign off since no renal adjustment is needed with ceftriaxone - re-consult [place] if need further assistance  Thank you for asking pharmacy to participate in this pt's care!  Dia Sitter, PharmD, BCPS 09/01/2014 10:41 AM

## 2014-09-01 NOTE — Clinical Documentation Improvement (Signed)
  Hospitalist  Can the diagnosis of "acute kidney disease" be further specified? This term equates to "disorder of kidney and ureter" in the Coding world.   Acute Renal Failure/Acute Kidney Injury  Acute Tubular Necrosis  Acute Renal Cortical Necrosis  Acute Renal Medullary Necrosis  Acute on Chronic Renal Failure  Chronic Renal Failure  Other  Clinically Undetermined  Document any associated diagnoses/conditions.   Supporting Information: -- Cr: 8/22 1.14,  8/23 0.98 -- Bun: 8/23 41   Please exercise your independent, professional judgment when responding. A specific answer is not anticipated or expected.   Thank You,  [place]

## 2014-09-01 NOTE — Consult Note (Signed)
Consultation Note Date: 09/01/2014   Patient Name: Victoria Patrick  DOB: Apr 25, 1922  MRN: [id number]  Age / Sex: 81 y.o., female   PCP: Deland Pretty, MD Referring Physician: Mendel Corning, MD  Reason for Consultation: Establishing goals of care  Palliative Care Assessment and Plan Summary of Established Goals of Care and Medical Treatment Preferences    Palliative Care Discussion Held Today:    This NP Wadie Lessen reviewed medical records, received report from team, assessed the patient and then meet at the patient's bedside along with her family and friend to include Harland Dingwall, Britta Mccreedy and Constance Holster to discuss current hospitalization,  GOC,  disposition and options.  Discussed situation of overall failure to thrive.  Her neighbor and friend reports continued physical and functional slow decline over the past 3 months, patient  recognizes this also, along with reported weight loss.   A  discussion was had today regarding advanced directives.  Concepts specific to code status, artifical feeding and hydration, continued IV antibiotics and rehospitalization was had.  The difference between a aggressive medical intervention path  and a palliative comfort care path for this patient at this time was had.  Values and goals of care important to patient and family were attempted to be elicited.  Concept of Palliative Care was discussed   Questions and concerns addressed.   Family encouraged to call with questions or concerns.  PMT will continue to support holistically.    Primary Decision Maker: patietn herself/ and HPOA is [name] T[place]all--I requested family bring copy of AD to be scaned into chart  Goals of Care/Code Status/Advance Care Planning:   Code Status: DNR/DNI   At this time patient is hopeful to treat the treatable,  to SNF for rehabilitation if eligible,  and remains hopeful for improvement and  to return to living independently.   Persons close to her worry that this may  not be a realistic goal.  Neighbors cannot continue to be "so involved" and "need to step back"  Symptom Management:   Weakness: PT evaluation and treatment, hopeful for SNF for rehabilitation.  Psycho-social/Spiritual:   Support System: neighbors and       family/ they live out of town  Desire for further Chaplaincy support:  Strong community church support   Discharge Planning:  [facility] for rehab with Palliative care service follow-up       Chief Complaint: weakness and confusion   History of Present Illness:  81 y.o. female with history of hypertension, hypothyroidism and recent surgery for bladder cancer with ileal conduit in June of this year was brought to the ER after patient was found to be increasingly weak and confused. As per patient's family was at the bedside patient's neighbor noticed that patient has become increasingly bedridden and weak and not eating well over the last 1 week with periods of confusion. Patient's family arrived from [place] and brought patient to the ER. Patient's blood work shows low chloride and hypercalcemia. Patient denies any nausea vomiting diarrhea abdominal pain chest pain shortness of breath. Chest x-ray did not show anything acute and CT head was unremarkable. On exam patient is nonfocal. Patient has been started on IV fluids and UA shows possible UTI for which antibiotics has been started and admitted for further management.   Primary Diagnoses  Present on Admission:  . Delirium . Hypercalcemia . UTI (urinary tract infection) . Essential hypertension . Bladder cancer . Hypothyroidism  Palliative Review of Systems:    -  weakness, weight loss   I have reviewed the medical record, interviewed the patient and family, and examined the patient. The following aspects are pertinent.  Past Medical History  Diagnosis Date  . Hx of bronchitis     age 33   . Boils     hx of  years ago   . Hypertension   .  Dysrhythmia     occasional skip   . Hypothyroidism   . Diabetes mellitus without complication     borderline on no meds   . Anxiety   . Cancer     hx of skin cancer on face    Social History   Social History  . Marital Status: Widowed    Spouse Name: N/A  . Number of Children: N/A  . Years of Education: N/A   Social History Main Topics  . Smoking status: Former Research scientist (life sciences)  . Smokeless tobacco: Never Used  . Alcohol Use: Yes     Comment: occasional glass of wine none since 12/2013   . Drug Use: No  . Sexual Activity: No   Other Topics Concern  . None   Social History Narrative   Family History  Problem Relation Age of Onset  . Bladder Cancer Neg Hx   . Stroke Maternal Aunt    Scheduled Meds: . cefTRIAXone (ROCEPHIN)  IV  1 g Intravenous QHS  . enoxaparin (LOVENOX) injection  40 mg Subcutaneous QHS  . levothyroxine  88 mcg Oral QAC breakfast  . losartan  100 mg Oral Daily  . saccharomyces boulardii  250 mg Oral BID   Continuous Infusions: . sodium chloride 75 mL/hr at 09/01/14 0000   PRN Meds:.ondansetron, senna-docusate, traMADol Medications Prior to Admission:  Prior to Admission medications   Medication Sig Start Date End Date Taking? Authorizing Provider  losartan-hydrochlorothiazide (HYZAAR) 100-12.5 MG per tablet Take 1 tablet by mouth every morning.    Yes Historical Provider, MD  SYNTHROID 88 MCG tablet Take 88 mcg by mouth every morning.  03/19/13  Yes Historical Provider, MD  ondansetron (ZOFRAN) 4 MG tablet Take 1 tablet (4 mg total) by mouth every 8 (eight) hours as needed for nausea or vomiting. Patient not taking: Reported on 06/25/2014 04/30/14   Irine Seal, MD  senna-docusate (SENOKOT-S) 8.6-50 MG per tablet Take 1 tablet by mouth 2 (two) times daily. While taking pain meds to prevent constipation Patient not taking: Reported on 08/31/2014 07/02/14   Alexis Frock, MD  traMADol (ULTRAM) 50 MG tablet Take 1-2 tablets (50-100 mg total) by mouth every 6 (six)  hours as needed for moderate pain. Post-operatively Patient not taking: Reported on 08/31/2014 07/02/14   Alexis Frock, MD   Allergies  Allergen Reactions  . Diovan [Valsartan] Itching  . Codeine Nausea And Vomiting   CBC:    Component Value Date/Time   WBC 9.1 08/31/2014 2230   WBC 6.9 04/09/2013 1442   HGB 10.9* 08/31/2014 2230   HGB 13.8 04/09/2013 1442   HCT 33.4* 08/31/2014 2230   HCT 40.9 04/09/2013 1442   PLT 138* 08/31/2014 2230   PLT 214 Platelet count consistent in citrate 04/09/2013 1442   MCV 86.1 08/31/2014 2230   MCV 93 04/09/2013 1442   NEUTROABS 6.5 08/31/2014 1826   NEUTROABS 4.6 04/09/2013 1442   LYMPHSABS 2.5 08/31/2014 1826   LYMPHSABS 1.6 04/09/2013 1442   MONOABS 1.4* 08/31/2014 1826   EOSABS 0.2 08/31/2014 1826   EOSABS 0.1 04/09/2013 1442   BASOSABS 0.0 08/31/2014 1826   BASOSABS  0.0 04/09/2013 1442   Comprehensive Metabolic Panel:    Component Value Date/Time   NA 136 09/01/2014 0531   K 3.5 09/01/2014 0531   CL 103 09/01/2014 0531   CO2 24 09/01/2014 0531   BUN 41* 09/01/2014 0531   CREATININE 0.98 09/01/2014 0531   GLUCOSE 96 09/01/2014 0531   CALCIUM 9.9 09/01/2014 0531   AST 16 09/01/2014 0531   ALT 16 09/01/2014 0531   ALKPHOS 46 09/01/2014 0531   BILITOT 0.7 09/01/2014 0531   PROT 5.6* 09/01/2014 0531   ALBUMIN 2.7* 09/01/2014 0531    Physical Exam:  Vital Signs: BP 109/58 mmHg  Pulse 68  Temp(Src) 97.8 F (36.6 C) (Oral)  Resp 20  Ht 5\' 7"  (1.702 m)  Wt 54.4 kg (119 lb 14.9 oz)  BMI 18.78 kg/m2  SpO2 98% SpO2: SpO2: 98 % O2 Device: O2 Device: Not Delivered O2 Flow Rate:   Intake/output summary:  Intake/Output Summary (Last 24 hours) at 09/01/14 1121 Last data filed at 09/01/14 0900  Gross per 24 hour  Intake    270 ml  Output    500 ml  Net   -230 ml   LBM: Last BM Date: 08/31/14 Baseline Weight: Weight: 54.432 kg (120 lb) Most recent weight: Weight: 54.4 kg (119 lb 14.9 oz)  Exam Findings:   General:  elderly female, NAD HEENT: moist buccal membranes, no exudate CVS: RRR Resp: CTA Abd: soft NT +BS Extrem: without edema Skin: warm and dry Neuro: alert and orietned           Palliative Performance Scale: 30 %                Additional Data Reviewed: Recent Labs     08/31/14  1826  08/31/14  2230  08/31/14  2341  09/01/14  0531  WBC  10.7*  9.1   --    --   HGB  12.6  10.9*   --    --   PLT  PLATELET CLUMPS NOTED ON SMEAR, COUNT APPEARS ADEQUATE  138*   --    --   NA  135   --    --   136  BUN  41*   --    --   41*  CREATININE  1.07*   --   1.14*  0.98   Time In: 1015 Time Out: 1130 Time Total: 75 min  Greater than 50%  of this time was spent counseling and coordinating care related to the above assessment and plan.  Discussed with  Dr  Tana Coast  Signed by: Wadie Lessen, NP  Knox Royalty, NP  09/01/2014, 11:21 AM  Please contact Palliative Medicine Team phone at [phone] for questions and concerns.   See AMION for contact information

## 2014-09-01 NOTE — Evaluation (Signed)
Physical Therapy Evaluation Patient Details Name: QUANITA BARONA MRN: [id number] DOB: 1922/04/28 Today's Date: 09/01/2014   History of Present Illness  81 y.o. female with history of hypertension, hypothyroidism and recent surgery for bladder cancer with ileal conduit in June of this year and admitted with weakness and confusion  Clinical Impression  Pt admitted with above diagnosis. Pt currently with functional limitations due to the deficits listed below (see PT Problem List).  Pt will benefit from skilled PT to increase their independence and safety with mobility to allow discharge to the venue listed below.   Pt reports increased weakness prior to admission and that her family has gone back to [facility].  Pt states church members can assist her at home if needed.  Pt able to ambulate good distance with RW and has RW and SPC at home.      Follow Up Recommendations Supervision/Assistance - 24 hour;Home health PT    Equipment Recommendations  None recommended by PT    Recommendations for Other Services       Precautions / Restrictions Precautions Precautions: Fall Precaution Comments: urostomy      Mobility  Bed Mobility Overal bed mobility: Needs Assistance Bed Mobility: Supine to Sit     Supine to sit: HOB elevated     General bed mobility comments: increased time and effort  Transfers Overall transfer level: Needs assistance Equipment used: Rolling walker (2 wheeled) Transfers: Sit to/from Stand Sit to Stand: Min guard         General transfer comment: min/guard for safety  Ambulation/Gait Ambulation/Gait assistance: Min guard Ambulation Distance (Feet): 160 Feet Assistive device: Rolling walker (2 wheeled) Gait Pattern/deviations: Step-through pattern;Decreased stride length     General Gait Details: verbal cues for safe use of RW, posture, steady with RW  Stairs            Wheelchair Mobility    Modified Rankin (Stroke Patients Only)       Balance  Overall balance assessment: History of Falls (reports fall prior to arrival but unable to recall cause?)                                           Pertinent Vitals/Pain Pain Assessment: No/denies pain    Home Living Family/patient expects to be discharged to:: Private residence Living Arrangements: Alone Available Help at Discharge:  (states church members can stay with her ) Type of Home: House       Home Layout: One level Home Equipment: Environmental consultant - 2 wheels;Cane - single point      Prior Function Level of Independence: Independent with assistive device(s)         Comments: typically uses SPC     Hand Dominance        Extremity/Trunk Assessment               Lower Extremity Assessment: Generalized weakness         Communication   Communication: No difficulties  Cognition Arousal/Alertness: Awake/alert Behavior During Therapy: WFL for tasks assessed/performed Overall Cognitive Status: No family/caregiver present to determine baseline cognitive functioning                      General Comments      Exercises        Assessment/Plan    PT Assessment Patient needs continued PT services  PT Diagnosis  Difficulty walking;Generalized weakness   PT Problem List Decreased strength;Decreased activity tolerance;Decreased mobility;Decreased knowledge of use of DME  PT Treatment Interventions DME instruction;Gait training;Functional mobility training;Therapeutic activities;Therapeutic exercise;Patient/family education   PT Goals (Current goals can be found in the Care Plan section) Acute Rehab PT Goals PT Goal Formulation: With patient Time For Goal Achievement: 09/08/14 Potential to Achieve Goals: Good    Frequency Min 3X/week   Barriers to discharge        Co-evaluation               End of Session   Activity Tolerance: Patient tolerated treatment well Patient left: in chair;with call bell/phone within reach;with  chair alarm set Nurse Communication: Mobility status         Time: 1660-6301 PT Time Calculation (min) (ACUTE ONLY): 21 min   Charges:   PT Evaluation $Initial PT Evaluation Tier I: 1 Procedure     PT G Codes:        Belynda Pagaduan,KATHrine E 09/01/2014, 3:50 PM Carmelia Bake, PT, DPT 09/01/2014 Pager: [phone]

## 2014-09-01 NOTE — Progress Notes (Signed)
Initial Nutrition Assessment  DOCUMENTATION CODES:   Not applicable  INTERVENTION:  - Will order Ensure Enlive BID, each supplement provides 350 kcal and 20 grams of protein - Encourage PO intake of meals and supplements - RD will continue to monitor for needs  NUTRITION DIAGNOSIS:   Inadequate oral intake related to lethargy/confusion as evidenced by meal completion < 25%.  GOAL:   Patient will meet greater than or equal to 90% of their needs  MONITOR:   PO intake, Supplement acceptance, Weight trends, Labs, I & O's  REASON FOR ASSESSMENT:   Malnutrition Screening Tool    ASSESSMENT:   81 y.o. female with history of hypertension, hypothyroidism and recent surgery for bladder cancer with ileal conduit in June of this year was brought to the ER after patient was found to be increasingly weak and confused. As per patient's family was at the bedside patient's neighbor noticed that patient has become increasingly bedridden and weak and not eating well over the last 1 week with periods of confusion. Patient's family arrived from [place] and brought patient to the ER. Patient's blood work shows low chloride and hypercalcemia. Patient denies any nausea vomiting diarrhea abdominal pain chest pain shortness of breath. Chest x-ray did not show anything acute and CT head was unremarkable. On exam patient is nonfocal. Patient has been started on IV fluids and UA shows possible UTI for which antibiotics has been started and admitted   Pt seen for MST. BMI indicates normal weight status. Pt sleeping at time of visit and noted to have AMS since admission; no family/visitors present.  Per chart review, pt consumed 10% of breakfast this AM. This does not meet nutrition needs; will order Ensure Enlive BID to supplement.  Unable to perform physical assessment at this time. Per weight hx review, pt has lost 15 lbs (11% body weight) in the past two months which is significant for time  frame.  Medications reviewed. Labs reviewed; BUN elevated, GFR: 50.  Diet Order:  Diet Heart Room service appropriate?: Yes; Fluid consistency:: Thin  Skin:  Reviewed, no issues  Last BM:  8/22  Height:   Ht Readings from Last 1 Encounters:  08/31/14 5\' 7"  (1.702 m)    Weight:   Wt Readings from Last 1 Encounters:  08/31/14 119 lb 14.9 oz (54.4 kg)    Ideal Body Weight:  61.36 kg (kg)  BMI:  Body mass index is 18.78 kg/(m^2).  Estimated Nutritional Needs:   Kcal:  1100-1300  Protein:  50-60 grams  Fluid:  2-2.2 L/day  EDUCATION NEEDS:   No education needs identified at this time     Jarome Matin, RD, LDN Inpatient Clinical Dietitian Pager # [phone] After hours/weekend pager # [phone]

## 2014-09-01 NOTE — Progress Notes (Signed)
[facility]                                                                              Patient Demographics  Victoria Patrick, is a 81 y.o. female, DOB - [birth year], JGO:[id number]  Admit date - 08/31/2014   Admitting Physician Victoria Patience, MD  Outpatient Primary MD for the patient is Victoria Pel, MD  LOS - 1   Chief Complaint  Patient presents with  . Confusion   . decreased ADLs        Brief HPI   Victoria Patrick is a 81 y.o. female with history of hypertension, hypothyroidism and recent surgery for bladder cancer with ileal conduit in June of this year was brought to the ER after patient was found to be increasingly weak and confused. As per patient's family was at the bedside, patient's neighbor noticed that patient has become increasingly bedridden and weak and not eating well over the last 1 week with periods of confusion. Patient's family arrived from [place] and brought patient to the ER. Patient's blood work shows low chloride and hypercalcemia. Patient denied any nausea vomiting, diarrhea, abdominal pain, chest pain, shortness of breath. Chest x-ray did not show anything acute and CT head was unremarkable. On exam patient is nonfocal. Patient has been started on IV fluids and UA shows possible UTI for which antibiotics has been started and admitted for further management.   Assessment & Plan    Principal Problem:   Weakness generalized possibly from poor by mouth intake and dehydration - Continue gentle hydration,   PT OT evaluation and follow urine culture and sensitivities, continue IV antibiotics - Chest x-ray reviewed, no pneumonia  Active Problems:   Hypercalcemia : Likely from dehydration, HCTZ  - Calcium has improved, continue gentle hydration     Delirium, ? Any underlying dementia  - Currently alert and awake, follows commands, ammonia level normal, CT head negative  - Obtain B12, TSH, folate, RPR  - Follow MRI of the  brain     UTI (urinary tract infection) - Follow urine culture and sensitive duties, continue IV Rocephin     Essential hypertension - Currently stable     Hypothyroidism - Continue Synthroid, follow TSH    mild thrombocytopenia  - Follow CBC for Crohn's   Mild acute on Probable chronic kidney disease stage III - Creatinine function improved  History of bladder cancer status post surgery with ileal conduit - Follow outpatient with urology  Code Status:DO NOT RESUSCITATE  Family Communication: Discussed in detail with the patient, all imaging results, lab results explained to the patient    Disposition Plan:Follow PTOT evaluation  Time Spent in minutes  25 minutes  Procedures CT head  Consults  None   DVT Prophylaxis   Lovenox   Medications  Scheduled Meds: . cefTRIAXone (ROCEPHIN)  IV  1 g Intravenous QHS  . enoxaparin (LOVENOX) injection  40 mg Subcutaneous QHS  . levothyroxine  88 mcg Oral QAC breakfast  . losartan  100 mg Oral Daily  . saccharomyces boulardii  250 mg Oral BID   Continuous Infusions: . sodium chloride 75 mL/hr  at 09/01/14 0000   PRN Meds:.ondansetron, senna-docusate, traMADol   Antibiotics   Anti-infectives    Start     Dose/Rate Route Frequency Ordered Stop   08/31/14 2245  cefTRIAXone (ROCEPHIN) 1 g in dextrose 5 % 50 mL IVPB     1 g 100 mL/hr over 30 Minutes Intravenous Daily at bedtime 08/31/14 2240          Subjective:   Victoria Patrick was seen and examined today. Feels better, alert and oriented 2, wants to go home.  Patient denies dizziness, chest pain, shortness of breath, abdominal pain, N/V/D/C, new weakness, numbess, tingling. No acute events overnight.    Objective:   Blood pressure 109/58, pulse 68, temperature 97.8 F (36.6 C), temperature source Oral, resp. rate 20, height 5\' 7"  (1.702 m), weight 54.4 kg (119 lb 14.9 oz), SpO2 98 %.  Wt Readings from Last 3 Encounters:  08/31/14 54.4 kg (119 lb 14.9 oz)    07/22/14 60.056 kg (132 lb 6.4 oz)  07/07/14 61.145 kg (134 lb 12.8 oz)     Intake/Output Summary (Last 24 hours) at 09/01/14 1044 Last data filed at 09/01/14 0900  Gross per 24 hour  Intake    270 ml  Output    500 ml  Net   -230 ml    Exam  General: Alert and oriented x 2,  NAD  HEENT:  PERRLA, EOMI, Anicteric Sclera, mucous membranes moist.   Neck: Supple, no JVD, no masses  CVS: S1 S2 auscultated, no rubs, murmurs or gallops. Regular rate and rhythm.  Respiratory: Clear to auscultation bilaterally, no wheezing, rales or rhonchi  Abdomen: Soft, nontender, nondistended, + bowel sounds, ileal conduit  Ext: no cyanosis clubbing or edema  Neuro: AAOx3, Cr N's II- XII. Strength 5/5 upper and lower extremities bilaterally  Skin: No rashes  Psych: Normal affect and demeanor, alert and oriented x3    Data Review   Micro Results No results found for this or any previous visit (from the past 240 hour(s)).  Radiology Reports Ct Head Wo Contrast  08/31/2014   CLINICAL DATA:  81 year old female with altered mental status. Symptoms following bladder surgery earlier this month. Initial encounter. Current history of bladder cancer.  EXAM: CT HEAD WITHOUT CONTRAST  TECHNIQUE: Contiguous axial images were obtained from the base of the skull through the vertex without intravenous contrast.  COMPARISON:  None.  FINDINGS: Visualized paranasal sinuses and mastoids are clear. No acute or suspicious calvarium lesion identified. No acute scalp or orbits soft tissue findings.  Calcified atherosclerosis at the skull base. Cerebral volume is within normal limits for age. No ventriculomegaly. No midline shift, mass effect, or evidence of intracranial mass lesion. Gray-white matter differentiation is within normal limits for age. No cortically based acute infarct identified. No acute intracranial hemorrhage identified. No suspicious intracranial vascular hyperdensity.  IMPRESSION: Negative for age  Normal noncontrast CT appearance of the brain.   Electronically Signed   By: Victoria Patrick M.D.   On: 08/31/2014 19:18   Dg Chest Port 1 View  09/01/2014   CLINICAL DATA:  Confusion and weakness.  EXAM: PORTABLE CHEST - 1 VIEW  COMPARISON:  None.  FINDINGS: Normal heart size and pulmonary vascularity. Bronchial wall thickening suggesting chronic bronchitis. No focal airspace disease or consolidation in the lungs. No blunting of costophrenic angles. No pneumothorax. Mediastinal contours appear intact. Calcified and tortuous aorta. Degenerative changes in the spine and shoulders.  IMPRESSION: Chronic bronchitic changes. No evidence of active pulmonary disease.  Electronically Signed   By: Lucienne Capers M.D.   On: 09/01/2014 00:23    CBC  Recent Labs Lab 08/31/14 1826 08/31/14 2230  WBC 10.7* 9.1  HGB 12.6 10.9*  HCT 38.4 33.4*  PLT PLATELET CLUMPS NOTED ON SMEAR, COUNT APPEARS ADEQUATE 138*  MCV 85.9 86.1  MCH 28.2 28.1  MCHC 32.8 32.6  RDW 14.6 14.8  LYMPHSABS 2.5  --   MONOABS 1.4*  --   EOSABS 0.2  --   BASOSABS 0.0  --     Chemistries   Recent Labs Lab 08/31/14 1826 08/31/14 2341 09/01/14 0531  NA 135  --  136  K 3.4*  --  3.5  CL 99*  --  103  CO2 27  --  24  GLUCOSE 121*  --  96  BUN 41*  --  41*  CREATININE 1.07* 1.14* 0.98  CALCIUM 11.5*  --  9.9  AST 26  --  16  ALT 23  --  16  ALKPHOS 63  --  46  BILITOT 0.8  --  0.7   ------------------------------------------------------------------------------------------------------------------ estimated creatinine clearance is 33.4 mL/min (by C-G formula based on Cr of 0.98). ------------------------------------------------------------------------------------------------------------------ No results for input(s): HGBA1C in the last 72 hours. ------------------------------------------------------------------------------------------------------------------ No results for input(s): CHOL, HDL, LDLCALC, TRIG, CHOLHDL,  LDLDIRECT in the last 72 hours. ------------------------------------------------------------------------------------------------------------------  Recent Labs  08/31/14 1825  TSH 1.741   ------------------------------------------------------------------------------------------------------------------ No results for input(s): VITAMINB12, FOLATE, FERRITIN, TIBC, IRON, RETICCTPCT in the last 72 hours.  Coagulation profile  Recent Labs Lab 08/31/14 1826  INR 1.04    No results for input(s): DDIMER in the last 72 hours.  Cardiac Enzymes  Recent Labs Lab 08/31/14 2341  TROPONINI <0.03   ------------------------------------------------------------------------------------------------------------------ Invalid input(s): POCBNP  No results for input(s): GLUCAP in the last 72 hours.   Madsen Riddle M.D. [facility] 09/01/2014, 10:44 AM  Pager: [phone] Between 7am to 7pm - call Pager - [phone]  After 7pm go to [email or web address] - password [email or web address]  Call night coverage person covering after 7pm

## 2014-09-02 DIAGNOSIS — Z7189 Other specified counseling: Secondary | ICD-10-CM

## 2014-09-02 DIAGNOSIS — Z515 Encounter for palliative care: Secondary | ICD-10-CM

## 2014-09-02 DIAGNOSIS — N3000 Acute cystitis without hematuria: Secondary | ICD-10-CM

## 2014-09-02 LAB — BASIC METABOLIC PANEL
Anion gap: 6 (ref 5–15)
BUN: 36 mg/dL — AB (ref 6–20)
CHLORIDE: 104 mmol/L (ref 101–111)
CO2: 26 mmol/L (ref 22–32)
CREATININE: 1.06 mg/dL — AB (ref 0.44–1.00)
Calcium: 9.6 mg/dL (ref 8.9–10.3)
GFR calc Af Amer: 52 mL/min — ABNORMAL LOW (ref >60)
GFR calc non Af Amer: 45 mL/min — ABNORMAL LOW (ref >60)
Glucose, Bld: 99 mg/dL (ref 65–99)
Potassium: 3.6 mmol/L (ref 3.5–5.1)
Sodium: 136 mmol/L (ref 135–145)

## 2014-09-02 LAB — CBC
HCT: 28.4 % — ABNORMAL LOW (ref 36.0–46.0)
Hemoglobin: 9 g/dL — ABNORMAL LOW (ref 12.0–15.0)
MCH: 27.8 pg (ref 26.0–34.0)
MCHC: 31.7 g/dL (ref 30.0–36.0)
MCV: 87.7 fL (ref 78.0–100.0)
PLATELETS: UNDETERMINED 10*3/uL (ref 150–400)
RBC: 3.24 MIL/uL — ABNORMAL LOW (ref 3.87–5.11)
RDW: 15.1 % (ref 11.5–15.5)
WBC: 5.6 10*3/uL (ref 4.0–10.5)

## 2014-09-02 LAB — URINE CULTURE

## 2014-09-02 NOTE — Progress Notes (Signed)
[facility]                                                                              Patient Demographics  Victoria Patrick, is a 81 y.o. female, DOB - 1922-11-09, NOI:[id number]  Admit date - 08/31/2014   Admitting Physician Rise Patience, MD  Outpatient Primary MD for the patient is Horatio Pel, MD  LOS - 2   Chief Complaint  Patient presents with  . Confusion   . decreased ADLs        Brief HPI   Victoria Patrick is a 81 y.o. female with history of hypertension, hypothyroidism and recent surgery for bladder cancer with ileal conduit in June of this year was brought to the ER after patient was found to be increasingly weak and confused. As per patient's family was at the bedside, patient's neighbor noticed that patient has become increasingly bedridden and weak and not eating well over the last 1 week with periods of confusion. Patient's family arrived from [place] and brought patient to the ER. Patient's blood work shows low chloride and hypercalcemia. Patient denied any nausea vomiting, diarrhea, abdominal pain, chest pain, shortness of breath. Chest x-ray did not show anything acute and CT head was unremarkable. On exam patient is nonfocal. Patient has been started on IV fluids and UA shows possible UTI for which antibiotics has been started and admitted for further management.   Assessment & Plan    Principal Problem:   Weakness generalized possibly from poor PO, UTI and dehydration improving - Continue gentle hydration, - Follow urine cultures, continue IV Rocephin - Chest x-ray reviewed, no pneumonia - PT evaluation recommending 24/7 supervision, patient has no family here in [place]  Active Problems:   Hypercalcemia : Likely from dehydration, HCTZ  - Calcium has improved, continue gentle hydration     Delirium, ? Any underlying dementia - completely resolved, patient is alert and oriented 4, likely worsened due to UTI - ammonia  level normal, CT head negative  - TSH, B12, folate all within normal limits  -MRI of the brain negative for any stroke    UTI (urinary tract infection) - Follow urine culture and sensitive duties, continue IV Rocephin     Essential hypertension - Currently stable     Hypothyroidism - Continue Synthroid, TSH 1.3   mild thrombocytopenia  - H&H stable  Mild acute on Probable chronic kidney disease stage III- likely worsened due to dehydration - Creatinine function improved  History of bladder cancer status post surgery with ileal conduit - Follow outpatient with urology  Code Status:DO NOT RESUSCITATE  Family Communication: Discussed in detail with the patient, all imaging results, lab results explained to the patient    Disposition Plan: Patient prefers to be discharged to [facility] if bed available, likely in a.m.  Time spent  25 minutes  Procedures  CT head MRI of the brain   Consults  None   DVT Prophylaxis   Lovenox   Medications  Scheduled Meds: . cefTRIAXone (ROCEPHIN)  IV  1 g Intravenous QHS  . enoxaparin (LOVENOX) injection  40 mg Subcutaneous QHS  .  feeding supplement (ENSURE ENLIVE)  237 mL Oral BID BM  . levothyroxine  88 mcg Oral QAC breakfast  . losartan  100 mg Oral Daily  . saccharomyces boulardii  250 mg Oral BID   Continuous Infusions:   PRN Meds:.ondansetron, senna-docusate, traMADol   Antibiotics   Anti-infectives    Start     Dose/Rate Route Frequency Ordered Stop   08/31/14 2245  cefTRIAXone (ROCEPHIN) 1 g in dextrose 5 % 50 mL IVPB     1 g 100 mL/hr over 30 Minutes Intravenous Daily at bedtime 08/31/14 2240          Subjective:   Victoria Patrick was seen and examined today.  Feels a whole better, alert and oriented 3, very pleasant and conversing. No fevers.   Patient denies dizziness, chest pain, shortness of breath, abdominal pain, N/V/D/C, new weakness, numbess, tingling. No acute events overnight.    Objective:   Blood  pressure 115/53, pulse 60, temperature 97.7 F (36.5 C), temperature source Oral, resp. rate 16, height 5\' 7"  (1.702 m), weight 54.4 kg (119 lb 14.9 oz), SpO2 100 %.  Wt Readings from Last 3 Encounters:  08/31/14 54.4 kg (119 lb 14.9 oz)  07/22/14 60.056 kg (132 lb 6.4 oz)  07/07/14 61.145 kg (134 lb 12.8 oz)     Intake/Output Summary (Last 24 hours) at 09/02/14 1050 Last data filed at 09/02/14 0510  Gross per 24 hour  Intake   1205 ml  Output   1775 ml  Net   -570 ml    Exam  General: Alert and oriented x 3,  NAD, pleasant and conversant   HEENT:  PERRLA, EOMI, Anicteric Sclera, mucous membranes moist.   Neck: Supple, no JVD, no masses  CVS: S1 S2 clear, RRR  Respiratory: CTAB  Abdomen: Soft, nontender, nondistended, + bowel sounds, ileal conduit  Ext: no cyanosis clubbing or edema  Neuro: AAOx3, Cr N's II- XII. Strength 5/5 upper and lower extremities bilaterally  Skin: No rashes  Psych: Normal affect and demeanor, alert and oriented x3    Data Review   Micro Results No results found for this or any previous visit (from the past 240 hour(s)).  Radiology Reports Ct Head Wo Contrast  08/31/2014   CLINICAL DATA:  81 year old female with altered mental status. Symptoms following bladder surgery earlier this month. Initial encounter. Current history of bladder cancer.  EXAM: CT HEAD WITHOUT CONTRAST  TECHNIQUE: Contiguous axial images were obtained from the base of the skull through the vertex without intravenous contrast.  COMPARISON:  None.  FINDINGS: Visualized paranasal sinuses and mastoids are clear. No acute or suspicious calvarium lesion identified. No acute scalp or orbits soft tissue findings.  Calcified atherosclerosis at the skull base. Cerebral volume is within normal limits for age. No ventriculomegaly. No midline shift, mass effect, or evidence of intracranial mass lesion. Gray-white matter differentiation is within normal limits for age. No cortically based  acute infarct identified. No acute intracranial hemorrhage identified. No suspicious intracranial vascular hyperdensity.  IMPRESSION: Negative for age Normal noncontrast CT appearance of the brain.   Electronically Signed   By: Genevie Ann M.D.   On: 08/31/2014 19:18   Mr Brain Wo Contrast  09/01/2014   CLINICAL DATA:  Delirium  EXAM: MRI HEAD WITHOUT CONTRAST  TECHNIQUE: Multiplanar, multiecho pulse sequences of the brain and surrounding structures were obtained without intravenous contrast.  COMPARISON:  Head CT from yesterday  FINDINGS: Calvarium and upper cervical spine: Heterogeneous marrow without focal mass  lesion.  Orbits: Bilateral cataract resection.  Sinuses and Mastoids: Clear. Mastoid and middle ears are clear.  Brain: No acute abnormality such as acute infarct, hemorrhage, hydrocephalus, or mass lesion. No evidence of large vessel occlusion.  Chronic small vessel disease with mild for age scattered ischemic gliosis in the bilateral cerebral white matter, confluent periventricular. A remote lacunar infarct is noted in the left frontal white matter, periventricular. Generalized cerebral volume loss, moderate for age.  IMPRESSION: 1. No acute finding. 2. Brain atrophy and mild for age chronic small vessel disease.   Electronically Signed   By: Monte Fantasia M.D.   On: 09/01/2014 15:17   Dg Chest Port 1 View  09/01/2014   CLINICAL DATA:  Confusion and weakness.  EXAM: PORTABLE CHEST - 1 VIEW  COMPARISON:  None.  FINDINGS: Normal heart size and pulmonary vascularity. Bronchial wall thickening suggesting chronic bronchitis. No focal airspace disease or consolidation in the lungs. No blunting of costophrenic angles. No pneumothorax. Mediastinal contours appear intact. Calcified and tortuous aorta. Degenerative changes in the spine and shoulders.  IMPRESSION: Chronic bronchitic changes. No evidence of active pulmonary disease.   Electronically Signed   By: Lucienne Capers M.D.   On: 09/01/2014 00:23     CBC  Recent Labs Lab 08/31/14 1826 08/31/14 2230 09/02/14 0533  WBC 10.7* 9.1 5.6  HGB 12.6 10.9* 9.0*  HCT 38.4 33.4* 28.4*  PLT PLATELET CLUMPS NOTED ON SMEAR, COUNT APPEARS ADEQUATE 138* PLATELET CLUMPS NOTED ON SMEAR, UNABLE TO ESTIMATE  MCV 85.9 86.1 87.7  MCH 28.2 28.1 27.8  MCHC 32.8 32.6 31.7  RDW 14.6 14.8 15.1  LYMPHSABS 2.5  --   --   MONOABS 1.4*  --   --   EOSABS 0.2  --   --   BASOSABS 0.0  --   --     Chemistries   Recent Labs Lab 08/31/14 1826 08/31/14 2341 09/01/14 0531 09/02/14 0533  NA 135  --  136 136  K 3.4*  --  3.5 3.6  CL 99*  --  103 104  CO2 27  --  24 26  GLUCOSE 121*  --  96 99  BUN 41*  --  41* 36*  CREATININE 1.07* 1.14* 0.98 1.06*  CALCIUM 11.5*  --  9.9 9.6  AST 26  --  16  --   ALT 23  --  16  --   ALKPHOS 63  --  46  --   BILITOT 0.8  --  0.7  --    ------------------------------------------------------------------------------------------------------------------ estimated creatinine clearance is 30.9 mL/min (by C-G formula based on Cr of 1.06). ------------------------------------------------------------------------------------------------------------------ No results for input(s): HGBA1C in the last 72 hours. ------------------------------------------------------------------------------------------------------------------ No results for input(s): CHOL, HDL, LDLCALC, TRIG, CHOLHDL, LDLDIRECT in the last 72 hours. ------------------------------------------------------------------------------------------------------------------  Recent Labs  09/01/14 1117  TSH 1.326   ------------------------------------------------------------------------------------------------------------------  Recent Labs  09/01/14 1117  VITAMINB12 506  FOLATE 18.9    Coagulation profile  Recent Labs Lab 08/31/14 1826  INR 1.04    No results for input(s): DDIMER in the last 72 hours.  Cardiac Enzymes  Recent Labs Lab 08/31/14 2341   TROPONINI <0.03   ------------------------------------------------------------------------------------------------------------------ Invalid input(s): POCBNP  No results for input(s): GLUCAP in the last 72 hours.   Victoria Patrick M.D. [facility] 09/02/2014, 10:50 AM  Pager: [phone] Between 7am to 7pm - call Pager - [phone]  After 7pm go to [email or web address] - password [email or web address]  Call night coverage person covering after 7pm

## 2014-09-02 NOTE — Care Management Important Message (Signed)
Important Message  Patient Details  Name: Victoria Patrick MRN: [id number] Date of Birth: 26-Nov-1922   Medicare Important Message Given:  Yes-second notification given    Camillo Flaming 09/02/2014, 1:45 [place] Message  Patient Details  Name: Victoria Patrick MRN: [id number] Date of Birth: Jul 18, 1922   Medicare Important Message Given:  Yes-second notification given    Camillo Flaming 09/02/2014, 1:45 PM

## 2014-09-02 NOTE — Evaluation (Signed)
Occupational Therapy Evaluation Patient Details Name: DENNETTE FAULCONER MRN: [id number] DOB: 09-06-1922 Today's Date: 09/02/2014    History of Present Illness 81 y.o. female with history of hypertension, hypothyroidism and recent surgery for bladder cancer with ileal conduit in June of this year and admitted with weakness and confusion   Clinical Impression   Pt admitted with above. She demonstrates the below listed deficits and will benefit from continued OT to maximize safety and independence with BADLs.  Pt currently requires min guard assist due to impaired balance.  She also demonstrates cognitive deficits, but no family available to determine if deficits are baseline or new, and how she was managing at home.  She does not have 24 hour supervision at discharge.  Due to deficits noted, if she were to return home, she will need 24 hour supervision and HHOT.  If deficits are baseline and she was marginally functional at home, she may need a higher level of care - ALF vs SNF.  No DME recommended.        Follow Up Recommendations  SNF;Supervision/Assistance - 24 hour    Equipment Recommendations  None recommended by OT    Recommendations for Other Services       Precautions / Restrictions Precautions Precautions: Fall Precaution Comments: urostomy      Mobility Bed Mobility Overal bed mobility: Needs Assistance Bed Mobility: Supine to Sit;Sit to Supine     Supine to sit: Supervision Sit to supine: Supervision      Transfers Overall transfer level: Needs assistance   Transfers: Sit to/from Stand;Stand Pivot Transfers Sit to Stand: Min guard Stand pivot transfers: Min guard            Balance Overall balance assessment: History of Falls                                          ADL Overall ADL's : Needs assistance/impaired Eating/Feeding: Independent;Bed level;Sitting   Grooming: Wash/dry hands;Wash/dry face;Min guard;Standing   Upper Body  Bathing: Set up;Supervision/ safety;Sitting   Lower Body Bathing: Min guard;Sit to/from stand   Upper Body Dressing : Supervision/safety;Sitting   Lower Body Dressing: Min guard;Sit to/from stand   Toilet Transfer: Min guard;Ambulation;Comfort height toilet   Toileting- Clothing Manipulation and Hygiene: Min guard;Sit to/from stand       Functional mobility during ADLs: Herbalist     Praxis      Pertinent Vitals/Pain Pain Assessment: No/denies pain     Hand Dominance Right   Extremity/Trunk Assessment Upper Extremity Assessment Upper Extremity Assessment: Generalized weakness   Lower Extremity Assessment Lower Extremity Assessment: Defer to PT evaluation       Communication Communication Communication: No difficulties   Cognition Arousal/Alertness: Awake/alert Behavior During Therapy: WFL for tasks assessed/performed Overall Cognitive Status: No family/caregiver present to determine baseline cognitive functioning Area of Impairment: Attention;Memory;Safety/judgement;Awareness;Problem solving   Current Attention Level: Selective Memory: Decreased short-term memory (questionable memory deficits )   Safety/Judgement: Decreased awareness of safety   Problem Solving: Difficulty sequencing;Requires verbal cues General Comments: No family present to provide info re: pt's baseline, nor how she was functioning at home.    General Comments       Exercises       Shoulder Instructions      Home Living Family/patient expects to be discharged to::  Unsure Living Arrangements: Alone Available Help at Discharge: Friend(s);Available PRN/intermittently Type of Home: House       Home Layout: One level               Home Equipment: [place] - 2 wheels;Cane - single point   Additional Comments: Pt reports she has church friends who can stay with her, but unsure they are able to provide the necessary amount of  supervision she will require at discharge      Prior Functioning/Environment Level of Independence: Independent with assistive device(s)        Comments: typically uses SPC    OT Diagnosis: Generalized weakness;Cognitive deficits   OT Problem List: Decreased strength;Decreased activity tolerance;Impaired balance (sitting and/or standing);Decreased cognition;Decreased safety awareness   OT Treatment/Interventions: Self-care/ADL training;DME and/or AE instruction;Therapeutic activities;Cognitive remediation/compensation;Balance training;Patient/family education    OT Goals(Current goals can be found in the care plan section) Acute Rehab OT Goals Patient Stated Goal: did not state OT Goal Formulation: With patient Time For Goal Achievement: 09/16/14 Potential to Achieve Goals: Good ADL Goals Pt Will Perform Grooming: with modified independence;standing Pt Will Perform Upper Body Bathing: with modified independence;sitting;standing Pt Will Perform Lower Body Bathing: with modified independence;sit to/from stand Pt Will Perform Upper Body Dressing: with modified independence;sitting Pt Will Perform Lower Body Dressing: with modified independence;sit to/from stand Pt Will Transfer to Toilet: with modified independence;ambulating;regular height toilet;bedside commode;grab bars Pt Will Perform Toileting - Clothing Manipulation and hygiene: with modified independence;sit to/from stand  OT Frequency: Min 2X/week   Barriers to D/C: Decreased caregiver support          Co-evaluation              End of Session Equipment Utilized During Treatment: Rolling walker Nurse Communication: Mobility status  Activity Tolerance: Patient tolerated treatment well Patient left: in bed;with call bell/phone within reach;with bed alarm set   Time: 2841-3244 OT Time Calculation (min): 28 min Charges:  OT General Charges $OT Visit: 1 Procedure OT Evaluation $Initial OT Evaluation Tier I: 1  Procedure OT Treatments $Self Care/Home Management : 8-22 mins G-Codes:    Mannie Wineland M 09-12-2014, 5:03 PM

## 2014-09-02 NOTE — Clinical Social Work Note (Signed)
Clinical Social Work Assessment  Patient Details  Name: Victoria Patrick MRN: [id number] Date of Birth: [birth year]  Date of referral:  09/02/14               Reason for consult:  Facility Placement                Permission sought to share information with:  Chartered certified accountant granted to share information::  Yes, Verbal Permission Granted  Name::        Agency::     Relationship::     Contact Information:     Housing/Transportation Living arrangements for the past 2 months:  Single Family Home Source of Information:  Patient Patient Interpreter Needed:  None Criminal Activity/Legal Involvement Pertinent to Current Situation/Hospitalization:  No - Comment as needed Significant Relationships:  Friend Lives with:  Self Do you feel safe going back to the place where you live?  No Need for family participation in patient care:  No (Coment)  Care giving concerns:  CSW received consult for SNF placement, reviewed PT evaluation recommending 24 hour supervision/Home Health.    Social Worker assessment / plan:  CSW spoke with patient who informed CSW that she recently was at [facility] 6/23 - 07/23/14 (used 21 days) but would prefer to return there for more rehab before going home.   Employment status:  Retired Forensic scientist:  [employer] PT Recommendations:  [place], Home with [place] / Referral to community resources:  [place]  Patient/Family's Response to care:  CSW awaiting response from [employer] re: bed offer.   Patient/Family's Understanding of and Emotional Response to Diagnosis, Current Treatment, and Prognosis:  Patient is glad UTI has somewhat cleared up, appears to be A&Ox4.   Emotional Assessment Appearance:  Appears stated age Attitude/Demeanor/Rapport:    Affect (typically observed):  Calm, Happy, Pleasant, Quiet Orientation:  Oriented to Self, Oriented to Place, Oriented to  Time, Oriented  to Situation Alcohol / Substance use:    Psych involvement (Current and /or in the community):     Discharge Needs  Concerns to be addressed:    Readmission within the last 30 days:    Current discharge risk:    Barriers to Discharge:      Standley Brooking, LCSW 09/02/2014, 10:21 AM

## 2014-09-02 NOTE — Care Management Note (Signed)
Case Management Note  Patient Details  Name: Victoria Patrick MRN: [id number] Date of Birth: 08/15/1922  Subjective/Objective:  Noted current PT eval-supv/24hr asst. MD spoke to Nsg to have PT to re eval since MD feels that she may need higher level of care.CSW aware. Await recommendations.                  Action/Plan:Monitor progress for d/c plans.   Expected Discharge Date:                  Expected Discharge Plan:  [place]  In-House Referral:  Clinical Social Work  Discharge planning Services  CM Consult  Post Acute Care Choice:    Choice offered to:     DME Arranged:    DME Agency:     HH Arranged:    [place] Agency:     Status of Service:  In process, will continue to follow  Medicare Important Message Given:    Date Medicare IM Given:    Medicare IM give by:    Date Additional Medicare IM Given:    Additional Medicare Important Message give by:     If discussed at [place] of Stay Meetings, dates discussed:    Additional Comments:  Dessa Phi, RN 09/02/2014, 9:42 AM

## 2014-09-03 DIAGNOSIS — E039 Hypothyroidism, unspecified: Secondary | ICD-10-CM | POA: Diagnosis not present

## 2014-09-03 DIAGNOSIS — E119 Type 2 diabetes mellitus without complications: Secondary | ICD-10-CM | POA: Diagnosis not present

## 2014-09-03 DIAGNOSIS — F419 Anxiety disorder, unspecified: Secondary | ICD-10-CM | POA: Diagnosis not present

## 2014-09-03 DIAGNOSIS — I10 Essential (primary) hypertension: Secondary | ICD-10-CM | POA: Diagnosis not present

## 2014-09-03 DIAGNOSIS — N390 Urinary tract infection, site not specified: Secondary | ICD-10-CM | POA: Diagnosis not present

## 2014-09-03 DIAGNOSIS — R2681 Unsteadiness on feet: Secondary | ICD-10-CM | POA: Diagnosis not present

## 2014-09-03 DIAGNOSIS — K5900 Constipation, unspecified: Secondary | ICD-10-CM | POA: Diagnosis not present

## 2014-09-03 DIAGNOSIS — C679 Malignant neoplasm of bladder, unspecified: Secondary | ICD-10-CM | POA: Diagnosis not present

## 2014-09-03 DIAGNOSIS — M6281 Muscle weakness (generalized): Secondary | ICD-10-CM | POA: Diagnosis not present

## 2014-09-03 DIAGNOSIS — R531 Weakness: Secondary | ICD-10-CM | POA: Diagnosis not present

## 2014-09-03 DIAGNOSIS — R410 Disorientation, unspecified: Secondary | ICD-10-CM | POA: Diagnosis not present

## 2014-09-03 LAB — BASIC METABOLIC PANEL
Anion gap: 5 (ref 5–15)
BUN: 35 mg/dL — AB (ref 6–20)
CALCIUM: 9.8 mg/dL (ref 8.9–10.3)
CO2: 27 mmol/L (ref 22–32)
CREATININE: 0.75 mg/dL (ref 0.44–1.00)
Chloride: 104 mmol/L (ref 101–111)
GFR calc Af Amer: >60 mL/min (ref >60)
GLUCOSE: 99 mg/dL (ref 65–99)
POTASSIUM: 4.2 mmol/L (ref 3.5–5.1)
SODIUM: 136 mmol/L (ref 135–145)

## 2014-09-03 LAB — CBC
HCT: 29.9 % — ABNORMAL LOW (ref 36.0–46.0)
Hemoglobin: 9.9 g/dL — ABNORMAL LOW (ref 12.0–15.0)
MCH: 29.2 pg (ref 26.0–34.0)
MCHC: 33.1 g/dL (ref 30.0–36.0)
MCV: 88.2 fL (ref 78.0–100.0)
PLATELETS: 157 10*3/uL (ref 150–400)
RBC: 3.39 MIL/uL — ABNORMAL LOW (ref 3.87–5.11)
RDW: 15.4 % (ref 11.5–15.5)
WBC: 5.1 10*3/uL (ref 4.0–10.5)

## 2014-09-03 LAB — HIV ANTIBODY (ROUTINE TESTING W REFLEX): HIV Screen 4th Generation wRfx: NONREACTIVE

## 2014-09-03 LAB — RPR: RPR: NONREACTIVE

## 2014-09-03 MED ORDER — TRAMADOL HCL 50 MG PO TABS: 50.0000 mg | ORAL_TABLET | Freq: Two times a day (BID) | ORAL | Status: DC | PRN

## 2014-09-03 MED ORDER — SENNOSIDES-DOCUSATE SODIUM 8.6-50 MG PO TABS: 1.0000 | ORAL_TABLET | Freq: Every evening | ORAL | Status: DC | PRN

## 2014-09-03 MED ORDER — CEPHALEXIN 500 MG PO CAPS: 500.0000 mg | ORAL_CAPSULE | Freq: Two times a day (BID) | ORAL | Status: DC

## 2014-09-03 MED ORDER — LOSARTAN POTASSIUM 100 MG PO TABS: 100.0000 mg | ORAL_TABLET | Freq: Every day | ORAL | Status: DC

## 2014-09-03 NOTE — Progress Notes (Signed)
Physical Therapy Treatment Patient Details Name: Victoria Patrick MRN: [id number] DOB: Oct 29, 1922 Today's Date: 09/23/2014    History of Present Illness 81 y.o. female with history of hypertension, hypothyroidism and recent surgery for bladder cancer with ileal conduit in June of this year and admitted with weakness and confusion    PT Comments    Pt ambulated good distance in hallway however continues to require cues for safety.  Per CSW notes, pt does not have 24/7 assist available at home so updated recommendations to SNF.  Pt likely to d/c to SNF today.  Follow Up Recommendations  Supervision/Assistance - 24 hour;SNF     Equipment Recommendations  None recommended by PT    Recommendations for Other Services       Precautions / Restrictions Precautions Precautions: Fall Precaution Comments: urostomy    Mobility  Bed Mobility Overal bed mobility: Needs Assistance Bed Mobility: Supine to Sit     Supine to sit: HOB elevated;Supervision     General bed mobility comments: increased time   Transfers Overall transfer level: Needs assistance Equipment used: Rolling walker (2 wheeled) Transfers: Sit to/from Stand Sit to Stand: Min guard         General transfer comment: min/guard for safety  Ambulation/Gait Ambulation/Gait assistance: Min guard Ambulation Distance (Feet): 280 Feet Assistive device: Rolling walker (2 wheeled) Gait Pattern/deviations: Step-through pattern;Decreased stride length;Trunk flexed     General Gait Details: verbal cues for safe use of RW, posture, tends to keep RW too far forward despite multiple cues to correct   Stairs            Wheelchair Mobility    Modified Rankin (Stroke Patients Only)       Balance                                    Cognition Arousal/Alertness: Awake/alert Behavior During Therapy: WFL for tasks assessed/performed Overall Cognitive Status: No family/caregiver present to determine  baseline cognitive functioning Area of Impairment: Attention;Memory;Safety/judgement;Awareness;Problem solving   Current Attention Level: Selective Memory: Decreased short-term memory (questionable memory deficits )   Safety/Judgement: Decreased awareness of safety   Problem Solving: Difficulty sequencing;Requires verbal cues      Exercises      General Comments        Pertinent Vitals/Pain      Home Living                      Prior Function            PT Goals (current goals can now be found in the care plan section) Progress towards PT goals: Progressing toward goals    Frequency  Min 3X/week    PT Plan Discharge plan needs to be updated    Co-evaluation             End of Session   Activity Tolerance: Patient tolerated treatment well Patient left: Other (comment) (on toilet, aware to use pull cord, RN and tech aware)     Time: 5093-2671 PT Time Calculation (min) (ACUTE ONLY): 17 min  Charges:  $Gait Training: 8-22 mins                    G Codes:      Shahrzad Koble,KATHrine E 09-23-14, 12:11 PM Carmelia Bake, PT, DPT 09/23/2014 Pager: [phone]

## 2014-09-03 NOTE — Clinical Documentation Improvement (Signed)
Hospitalist  Note 8/24 states "Mild acute on Probable chronic kidney disease stage III". The term "acute kidney disease" equates in the Coding world to "disorder of kidney and ureter". If this is not what you intended please clarify the term "acute kidney disease" in next Note and DS.   Possible Conditions -- AKI on CKD III -- Acute renal failure on CKD III -- Other condition -- Unable to clinically determine  Please exercise your independent, professional judgment when responding. A specific answer is not anticipated or expected.   Thank You, [place]

## 2014-09-03 NOTE — Discharge Summary (Signed)
Physician Discharge Summary   Patient ID: Victoria Patrick MRN: [id number] DOB/AGE: 79-Jul-1924 79 y.o.  Admit date: 08/31/2014 Discharge date: 09/03/2014  Primary Care Physician:  Horatio Pel, MD  Discharge Diagnoses:    . Delirium resolved  . Hypercalcemia . UTI (urinary tract infection) . Essential hypertension . Bladder cancer . Hypothyroidism  Consults: None   Recommendations for Outpatient Follow-up:  Fall precautions  Keflex 500 mg twice a day for 3 days  TESTS THAT NEED FOLLOW-UP BMET   DIET: Heart healthy diet    Allergies:   Allergies  Allergen Reactions  . Diovan [Valsartan] Itching  . Codeine Nausea And Vomiting     Discharge Medications:   Medication List    STOP taking these medications        losartan-hydrochlorothiazide 100-12.5 MG per tablet  Commonly known as:  HYZAAR     ondansetron 4 MG tablet  Commonly known as:  ZOFRAN      TAKE these medications        cephALEXin 500 MG capsule  Commonly known as:  KEFLEX  Take 1 capsule (500 mg total) by mouth 2 (two) times daily. X3 DAYS     losartan 100 MG tablet  Commonly known as:  COZAAR  Take 1 tablet (100 mg total) by mouth daily.     senna-docusate 8.6-50 MG per tablet  Commonly known as:  Senokot-S  Take 1 tablet by mouth at bedtime as needed for mild constipation. While taking pain meds to prevent constipation     SYNTHROID 88 MCG tablet  Generic drug:  levothyroxine  Take 88 mcg by mouth every morning.     traMADol 50 MG tablet  Commonly known as:  ULTRAM  Take 1 tablet (50 mg total) by mouth every 12 (twelve) hours as needed for severe pain.         Brief H and P: For complete details please refer to admission H and P, but in brief Victoria Patrick is a 81 y.o. female with history of hypertension, hypothyroidism and recent surgery for bladder cancer with ileal conduit in June of this year was brought to the ER after patient was found to be increasingly weak and  confused. As per patient's family was at the bedside, patient's neighbor noticed that patient has become increasingly bedridden and weak and not eating well over the last 1 week with periods of confusion. Patient's family arrived from [place] and brought patient to the ER. Patient's blood work shows low chloride and hypercalcemia. Patient denied any nausea vomiting, diarrhea, abdominal pain, chest pain, shortness of breath. Chest x-ray did not show anything acute and CT head was unremarkable. On exam patient is nonfocal. Patient has been started on IV fluids and UA shows possible UTI for which antibiotics has been started and admitted for further management.   Hospital Course:  Weakness generalized possibly from poor PO, UTI and dehydration improving Patient was placed on IV fluid hydration. She is not tolerating solid diet without any difficulty. UA was positive for UTI and urine culture showed multiple species. Patient was placed on IV Rocephin and had significant improvement in her mental status and weakness. Chest x-ray showed no pneumonia.  PT evaluation recommending 24/7 supervision, patient has no family here in [place]. Patient will be discharged on Keflex for 3 days   Hypercalcemia : Likely from dehydration, HCTZ  - Calcium has improved with IV fluid hydration    Delirium, ? Any underlying mild dementia - completely resolved, patient  is alert and oriented 4, likely worsened due to UTI - ammonia level normal, CT head negative  - TSH, B12, folate all within normal limits  -MRI of the brain negative for any stroke   UTI (urinary tract infection) Continue Keflex for 3 days, urine culture showed multiple species   Essential hypertension - Currently stable, continue losartan   Hypothyroidism - Continue Synthroid, TSH 1.3  mild thrombocytopenia  - H&H stable  Mild acute on Probable chronic kidney disease stage III- likely worsened due to dehydration - Creatinine  function improved, HCTZ was discontinued  History of bladder cancer status post surgery with ileal conduit - Follow outpatient with urology  Day of Discharge BP 116/52 mmHg  Pulse 66  Temp(Src) 97.7 F (36.5 C) (Oral)  Resp 20  Ht 5\' 7"  (1.702 m)  Wt 54.4 kg (119 lb 14.9 oz)  BMI 18.78 kg/m2  SpO2 97%  Physical Exam: General: Alert and awake oriented x3 not in any acute distress. HEENT: anicteric sclera, pupils reactive to light and accommodation CVS: S1-S2 clear no murmur rubs or gallops Chest: clear to auscultation bilaterally, no wheezing rales or rhonchi Abdomen: soft nontender, nondistended, normal bowel sounds Extremities: no cyanosis, clubbing or edema noted bilaterally Neuro: Cranial nerves II-XII intact, no focal neurological deficits   The results of significant diagnostics from this hospitalization (including imaging, microbiology, ancillary and laboratory) are listed below for reference.    LAB RESULTS: Basic Metabolic Panel:  Recent Labs Lab 09/02/14 0533 09/03/14 0539  NA 136 136  K 3.6 4.2  CL 104 104  CO2 26 27  GLUCOSE 99 99  BUN 36* 35*  CREATININE 1.06* 0.75  CALCIUM 9.6 9.8   Liver Function Tests:  Recent Labs Lab 08/31/14 1826 09/01/14 0531  AST 26 16  ALT 23 16  ALKPHOS 63 46  BILITOT 0.8 0.7  PROT 8.0 5.6*  ALBUMIN 3.8 2.7*   No results for input(s): LIPASE, AMYLASE in the last 168 hours.  Recent Labs Lab 08/31/14 1850  AMMONIA 14   CBC:  Recent Labs Lab 08/31/14 1826  09/02/14 0533 09/03/14 0539  WBC 10.7*  < > 5.6 5.1  NEUTROABS 6.5  --   --   --   HGB 12.6  < > 9.0* 9.9*  HCT 38.4  < > 28.4* 29.9*  MCV 85.9  < > 87.7 88.2  PLT PLATELET CLUMPS NOTED ON SMEAR, COUNT APPEARS ADEQUATE  < > PLATELET CLUMPS NOTED ON SMEAR, UNABLE TO ESTIMATE 157  < > = values in this interval not displayed. Cardiac Enzymes:  Recent Labs Lab 08/31/14 2341  CKTOTAL 14*  TROPONINI <0.03   BNP: Invalid input(s): POCBNP CBG: No  results for input(s): GLUCAP in the last 168 hours.  Significant Diagnostic Studies:  Ct Head Wo Contrast  08/31/2014   CLINICAL DATA:  81 year old female with altered mental status. Symptoms following bladder surgery earlier this month. Initial encounter. Current history of bladder cancer.  EXAM: CT HEAD WITHOUT CONTRAST  TECHNIQUE: Contiguous axial images were obtained from the base of the skull through the vertex without intravenous contrast.  COMPARISON:  None.  FINDINGS: Visualized paranasal sinuses and mastoids are clear. No acute or suspicious calvarium lesion identified. No acute scalp or orbits soft tissue findings.  Calcified atherosclerosis at the skull base. Cerebral volume is within normal limits for age. No ventriculomegaly. No midline shift, mass effect, or evidence of intracranial mass lesion. Gray-white matter differentiation is within normal limits for age. No cortically based acute  infarct identified. No acute intracranial hemorrhage identified. No suspicious intracranial vascular hyperdensity.  IMPRESSION: Negative for age Normal noncontrast CT appearance of the brain.   Electronically Signed   By: Genevie Ann M.D.   On: 08/31/2014 19:18   Mr Brain Wo Contrast  09/01/2014   CLINICAL DATA:  Delirium  EXAM: MRI HEAD WITHOUT CONTRAST  TECHNIQUE: Multiplanar, multiecho pulse sequences of the brain and surrounding structures were obtained without intravenous contrast.  COMPARISON:  Head CT from yesterday  FINDINGS: Calvarium and upper cervical spine: Heterogeneous marrow without focal mass lesion.  Orbits: Bilateral cataract resection.  Sinuses and Mastoids: Clear. Mastoid and middle ears are clear.  Brain: No acute abnormality such as acute infarct, hemorrhage, hydrocephalus, or mass lesion. No evidence of large vessel occlusion.  Chronic small vessel disease with mild for age scattered ischemic gliosis in the bilateral cerebral white matter, confluent periventricular. A remote lacunar infarct is  noted in the left frontal white matter, periventricular. Generalized cerebral volume loss, moderate for age.  IMPRESSION: 1. No acute finding. 2. Brain atrophy and mild for age chronic small vessel disease.   Electronically Signed   By: Monte Fantasia M.D.   On: 09/01/2014 15:17   Dg Chest Port 1 View  09/01/2014   CLINICAL DATA:  Confusion and weakness.  EXAM: PORTABLE CHEST - 1 VIEW  COMPARISON:  None.  FINDINGS: Normal heart size and pulmonary vascularity. Bronchial wall thickening suggesting chronic bronchitis. No focal airspace disease or consolidation in the lungs. No blunting of costophrenic angles. No pneumothorax. Mediastinal contours appear intact. Calcified and tortuous aorta. Degenerative changes in the spine and shoulders.  IMPRESSION: Chronic bronchitic changes. No evidence of active pulmonary disease.   Electronically Signed   By: Lucienne Capers M.D.   On: 09/01/2014 00:23    2D ECHO:   Disposition and Follow-up: Discharge Instructions    Diet - low sodium heart healthy    Complete by:  As directed      Increase activity slowly    Complete by:  As directed             DISPOSITION: Skilled nursing facility   DISCHARGE FOLLOW-UP Follow-up Information    Follow up with Horatio Pel, MD. Schedule an appointment as soon as possible for a visit in 2 weeks.   Specialty:  Internal Medicine   Why:  for hospital follow-up   Contact information:   [address] [place] [address] [phone]        Time spent on Discharge: 35 minutes  Signed:   Takeshi Teasdale M.D. [facility] 09/03/2014, 10:39 AM Pager: [phone]

## 2014-09-03 NOTE — Clinical Social Work Placement (Signed)
Patient is set to discharge to [facility] today. Patient & friend, [name] aware. Discharge packet given to RN, Isabell Jarvis will be called for transport when room is ready at [facility].      Raynaldo Opitz, [facility] Clinical Social Worker cell #: [phone]    CLINICAL SOCIAL WORK PLACEMENT  NOTE  Date:  09/03/2014  Patient Details  Name: Victoria Patrick MRN: [id number] Date of Birth: October 04, 1922  Clinical Social Work is seeking post-discharge placement for this patient at the [place] level of care (*CSW will initial, date and re-position this form in  chart as items are completed):  Yes   Patient/family provided with [facility] Work Department's list of facilities offering this level of care within the geographic area requested by the patient (or if unable, by the patient's family).  Yes   Patient/family informed of their freedom to choose among providers that offer the needed level of care, that participate in Medicare, Medicaid or managed care program needed by the patient, have an available bed and are willing to accept the patient.  Yes   Patient/family informed of [facility]'s ownership interest in [facility] and [facility], as well as of the fact that they are under no obligation to receive care at these facilities.  PASRR submitted to EDS on 09/03/14     PASRR number received on 09/03/14     Existing PASRR number confirmed on       FL2 transmitted to all facilities in geographic area requested by pt/family on 09/03/14     FL2 transmitted to all facilities within larger geographic area on       Patient informed that his/her managed care company has contracts with or will negotiate with certain facilities, including the following:        Yes   Patient/family informed of bed offers received.  Patient chooses bed at [facility]     Physician recommends and patient chooses bed at      Patient to  be transferred to [facility] on 09/03/14.  Patient to be transferred to facility by [facility]     Patient family notified on 09/03/14 of transfer.  Name of family member notified:  patient informed her friend, [name] via phone     PHYSICIAN       Additional Comment:    _______________________________________________ Standley Brooking, LCSW 09/03/2014, 11:20 AM

## 2014-09-03 NOTE — Clinical Documentation Improvement (Signed)
Hospitalist  Based on the clinical findings below, please document any associated diagnoses/conditions the patient has or may have.   Underweight  Other  Clinically Undetermined  Supporting Information: --  Current BMI 18.7 --  Per Consult 8/24 "increasingly bedridden and weak and not eating", "weakness and weight loss"   Please exercise your independent, professional judgment when responding. A specific answer is not anticipated or expected.   Thank You, Ezekiel Ina RN [place]ika [phone]

## 2014-09-04 ENCOUNTER — Encounter: Payer: Self-pay | Admitting: Adult Health

## 2014-09-04 ENCOUNTER — Non-Acute Institutional Stay (SKILLED_NURSING_FACILITY): Payer: Medicare Other | Admitting: Adult Health

## 2014-09-04 DIAGNOSIS — N390 Urinary tract infection, site not specified: Secondary | ICD-10-CM | POA: Diagnosis not present

## 2014-09-04 DIAGNOSIS — K5900 Constipation, unspecified: Secondary | ICD-10-CM

## 2014-09-04 DIAGNOSIS — E039 Hypothyroidism, unspecified: Secondary | ICD-10-CM | POA: Diagnosis not present

## 2014-09-04 DIAGNOSIS — I10 Essential (primary) hypertension: Secondary | ICD-10-CM

## 2014-09-04 DIAGNOSIS — C679 Malignant neoplasm of bladder, unspecified: Secondary | ICD-10-CM

## 2014-09-04 DIAGNOSIS — R531 Weakness: Secondary | ICD-10-CM

## 2014-09-06 NOTE — Progress Notes (Signed)
Patient ID: Victoria Patrick, female   DOB: 06-07-1922, 81 y.o.   MRN: [id number]    DATE:  09/04/14 MRN:  [id number]  BIRTHDAY: 24-May-1922  Facility:  Nursing Home Location:  [facility] Room Number: 1006-2  LEVEL OF CARE:  SNF [phone])  Contact Information    Name Relation Home Work Mobile   [place] Irena [phone]  [phone]   Jiles Prows   [phone]       Chief Complaint  Patient presents with  . Hospitalization Follow-up    Generalized weakness, UTI, hypertension, hypothyroidism, history of bladder cancer and constipation    HISTORY OF PRESENT ILLNESS:  This is an 81 year old female who has been admitted to [facility] on 09/03/14 from [facility]. She has PMH of hypertension, hypothyroidism, recent surgery for bladder cancer with ileal conduit in June. She was brought to the hospital due to increasing weakness/confusion and was diagnosed with UTI. She was started on antibiotics and given IV fluids for dehydration.  She has been admitted for a short-term rehabilitation.  PAST MEDICAL HISTORY:  Past Medical History  Diagnosis Date  . Hx of bronchitis     age 67   . Boils     hx of  years ago   . Hypertension   . Dysrhythmia     occasional skip   . Hypothyroidism   . Diabetes mellitus without complication     borderline on no meds   . Anxiety   . Cancer     hx of skin cancer on face      CURRENT MEDICATIONS: Reviewed  Patient's Medications  New Prescriptions   No medications on file  Previous Medications   CEPHALEXIN (KEFLEX) 500 MG CAPSULE    Take 1 capsule (500 mg total) by mouth 2 (two) times daily. X3 DAYS   LOSARTAN (COZAAR) 100 MG TABLET    Take 1 tablet (100 mg total) by mouth daily.   SENNA-DOCUSATE (SENOKOT-S) 8.6-50 MG PER TABLET    Take 1 tablet by mouth at bedtime as needed for mild constipation. While taking pain meds to prevent constipation   SYNTHROID 88 MCG TABLET    Take 88 mcg by mouth every morning.    TRAMADOL (ULTRAM) 50 MG TABLET    Take 1 tablet (50 mg total) by mouth every 12 (twelve) hours as needed for severe pain.  Modified Medications   No medications on file  Discontinued Medications   No medications on file     Allergies  Allergen Reactions  . Diovan [Valsartan] Itching  . Codeine Nausea And Vomiting     REVIEW OF SYSTEMS:  GENERAL: no change in appetite, no fatigue, no weight changes, no fever, chills or weakness EYES: Denies change in vision, dry eyes, eye pain, itching or discharge EARS: Denies change in hearing, ringing in ears, or earache NOSE: Denies nasal congestion or epistaxis MOUTH and THROAT: Denies oral discomfort, gingival pain or bleeding, pain from teeth or hoarseness   RESPIRATORY: no cough, SOB, DOE, wheezing, hemoptysis CARDIAC: no chest pain, edema or palpitations GI: no abdominal pain, diarrhea, heart burn, nausea or vomiting, +constipation PSYCHIATRIC: Denies feeling of depression or anxiety. No report of hallucinations, insomnia, paranoia, or agitation   PHYSICAL EXAMINATION  GENERAL APPEARANCE: Well nourished. In no acute distress. Normal body habitus SKIN:  Skin is warm and dry. There are no suspicious lesions or rash HEAD: Normal in size and contour. No  evidence of trauma EYES: Lids open and close normally. No blepharitis, entropion or ectropion. PERRL. Conjunctivae are clear and sclerae are white. Lenses are without opacity EARS: Pinnae are normal. Patient hears normal voice tunes of the examiner MOUTH and THROAT: Lips are without lesions. Oral mucosa is moist and without lesions. Tongue is normal in shape, size, and color and without lesions NECK: supple, trachea midline, no neck masses, no thyroid tenderness, no thyromegaly LYMPHATICS: no LAN in the neck, no supraclavicular LAN RESPIRATORY: breathing is even & unlabored, BS CTAB CARDIAC: RRR, no murmur,no extra heart  sounds, no edema GI: abdomen soft, normal BS, no masses, no tenderness, no hepatomegaly, no splenomegaly GU:  Has ileal conduit draining to bag EXTREMITIES:  Able to move 4 extremities PSYCHIATRIC: Alert and oriented X 3. Affect and behavior are appropriate  LABS/RADIOLOGY: Labs reviewed: Basic Metabolic Panel:  Recent Labs  09/01/14 0531 09/02/14 0533 09/03/14 0539  NA 136 136 136  K 3.5 3.6 4.2  CL 103 104 104  CO2 24 26 27   GLUCOSE 96 99 99  BUN 41* 36* 35*  CREATININE 0.98 1.06* 0.75  CALCIUM 9.9 9.6 9.8   Liver Function Tests:  Recent Labs  06/25/14 1511 08/31/14 1826 09/01/14 0531  AST 27 26 16   ALT 17 23 16   ALKPHOS 60 63 46  BILITOT 0.6 0.8 0.7  PROT 7.3 8.0 5.6*  ALBUMIN 4.5 3.8 2.7*    Recent Labs  08/31/14 1850  AMMONIA 14   CBC:  Recent Labs  08/31/14 1826 08/31/14 2230 09/02/14 0533 09/03/14 0539  WBC 10.7* 9.1 5.6 5.1  NEUTROABS 6.5  --   --   --   HGB 12.6 10.9* 9.0* 9.9*  HCT 38.4 33.4* 28.4* 29.9*  MCV 85.9 86.1 87.7 88.2  PLT PLATELET CLUMPS NOTED ON SMEAR, COUNT APPEARS ADEQUATE 138* PLATELET CLUMPS NOTED ON SMEAR, UNABLE TO ESTIMATE 157   Cardiac Enzymes:  Recent Labs  08/31/14 2341  CKTOTAL 14*  TROPONINI <0.03   CBG:  Recent Labs  04/30/14 0819 06/26/14 0529 06/26/14 1429  GLUCAP 104* 94 180*     Ct Head Wo Contrast  08/31/2014   CLINICAL DATA:  81 year old female with altered mental status. Symptoms following bladder surgery earlier this month. Initial encounter. Current history of bladder cancer.  EXAM: CT HEAD WITHOUT CONTRAST  TECHNIQUE: Contiguous axial images were obtained from the base of the skull through the vertex without intravenous contrast.  COMPARISON:  None.  FINDINGS: Visualized paranasal sinuses and mastoids are clear. No acute or suspicious calvarium lesion identified. No acute scalp or orbits soft tissue findings.  Calcified atherosclerosis at the skull base. Cerebral volume is within normal  limits for age. No ventriculomegaly. No midline shift, mass effect, or evidence of intracranial mass lesion. Gray-white matter differentiation is within normal limits for age. No cortically based acute infarct identified. No acute intracranial hemorrhage identified. No suspicious intracranial vascular hyperdensity.  IMPRESSION: Negative for age Normal noncontrast CT appearance of the brain.   Electronically Signed   By: Genevie Ann M.D.   On: 08/31/2014 19:18   Mr Brain Wo Contrast  09/01/2014   CLINICAL DATA:  Delirium  EXAM: MRI HEAD WITHOUT CONTRAST  TECHNIQUE: Multiplanar, multiecho pulse sequences of the brain and surrounding structures were obtained without intravenous contrast.  COMPARISON:  Head CT from yesterday  FINDINGS: Calvarium and upper cervical spine: Heterogeneous marrow without focal mass lesion.  Orbits: Bilateral cataract resection.  Sinuses and Mastoids: Clear. Mastoid and middle ears  are clear.  Brain: No acute abnormality such as acute infarct, hemorrhage, hydrocephalus, or mass lesion. No evidence of large vessel occlusion.  Chronic small vessel disease with mild for age scattered ischemic gliosis in the bilateral cerebral white matter, confluent periventricular. A remote lacunar infarct is noted in the left frontal white matter, periventricular. Generalized cerebral volume loss, moderate for age.  IMPRESSION: 1. No acute finding. 2. Brain atrophy and mild for age chronic small vessel disease.   Electronically Signed   By: Monte Fantasia M.D.   On: 09/01/2014 15:17   Dg Chest Port 1 View  09/01/2014   CLINICAL DATA:  Confusion and weakness.  EXAM: PORTABLE CHEST - 1 VIEW  COMPARISON:  None.  FINDINGS: Normal heart size and pulmonary vascularity. Bronchial wall thickening suggesting chronic bronchitis. No focal airspace disease or consolidation in the lungs. No blunting of costophrenic angles. No pneumothorax. Mediastinal contours appear intact. Calcified and tortuous aorta. Degenerative  changes in the spine and shoulders.  IMPRESSION: Chronic bronchitic changes. No evidence of active pulmonary disease.   Electronically Signed   By: Lucienne Capers M.D.   On: 09/01/2014 00:23    ASSESSMENT/PLAN:  Generalized weakness - for rehabilitation  UTI - continue Keflex 500 mg 1 capsule by mouth twice a day 3 days check CBC in 1 week  Essential hypertension - continue losartan 100 mg 1 tab by mouth daily; check BMP in 1 week  Hypothyroidism - TSH 1.326; continue Synthroid 88 g 1 tab by mouth daily  Bladder cancer -  S/P surgery with ileal conduit  Constipation - change senna S 2 tabs by mouth BID X 5 days then 2 tabs Q HS and Miralax 17 gm PO BID X 3 days then PRN     Goals of care:  Short-term rehabilitation   [facility], NP [place] [phone]

## 2014-09-07 ENCOUNTER — Non-Acute Institutional Stay (SKILLED_NURSING_FACILITY): Payer: Medicare Other | Admitting: Internal Medicine

## 2014-09-07 DIAGNOSIS — E039 Hypothyroidism, unspecified: Secondary | ICD-10-CM

## 2014-09-07 DIAGNOSIS — C679 Malignant neoplasm of bladder, unspecified: Secondary | ICD-10-CM

## 2014-09-07 DIAGNOSIS — N390 Urinary tract infection, site not specified: Secondary | ICD-10-CM | POA: Diagnosis not present

## 2014-09-07 DIAGNOSIS — R531 Weakness: Secondary | ICD-10-CM

## 2014-09-07 DIAGNOSIS — I10 Essential (primary) hypertension: Secondary | ICD-10-CM

## 2014-09-07 NOTE — Progress Notes (Signed)
Patient ID: Victoria Patrick, female   DOB: [birth year], 81 y.o.   MRN: [id number]     [facility]   PCP: Horatio Pel, MD  Code Status: full code  Allergies  Allergen Reactions  . Diovan [Valsartan] Itching  . Codeine Nausea And Vomiting    Chief Complaint  Patient presents with  . New Admit To SNF     HPI:  81 y.o. patient is here for short term rehabilitation post hospital admission with weakness and confusion. She was diagnosed with hypercalcemia and UTI. She was treated with iv fluids and antibiotics. She is seen in her room today. She denies any concerns. She feels that her strength is improving. She has hx of HTN, hypothyroidism and  recent surgery for bladder cancer with ileal conduit in June.    Review of Systems:  Constitutional: Negative for fever, chills, diaphoresis.  HENT: Negative for headache, congestion, nasal discharge Eyes: Negative for eye pain, blurred vision, double vision and discharge.  Respiratory: Negative for cough, shortness of breath and wheezing.   Cardiovascular: Negative for chest pain, palpitations, leg swelling.  Gastrointestinal: Negative for heartburn, nausea, vomiting, abdominal pain. Appetite is picking up, had good breakfast this am Genitourinary: has ileal conduit Musculoskeletal: Negative for back pain, falls Skin: Negative for itching, rash.  Neurological: Negative for dizziness, tingling, focal weakness Psychiatric/Behavioral: Negative for depression    Past Medical History  Diagnosis Date  . Hx of bronchitis     age 54   . Boils     hx of  years ago   . Hypertension   . Dysrhythmia     occasional skip   . Hypothyroidism   . Diabetes mellitus without complication     borderline on no meds   . Anxiety   . Cancer     hx of skin cancer on face    Past Surgical History  Procedure Laterality Date  . Hx of skin cancer removal      locally done   . Cystoscopy N/A 04/30/2014   Procedure: CYSTOSCOPY;  Surgeon: Irine Seal, MD;  Location: [facility];  Service: Urology;  Laterality: N/A;  . Transurethral resection of bladder tumor N/A 04/30/2014    Procedure: TRANSURETHRAL RESECTION OF BLADDER TUMOR (TURBT);  Surgeon: Irine Seal, MD;  Location: [facility];  Service: Urology;  Laterality: N/A;  . Robot assisted laparoscopic complete cystect ileal conduit N/A 06/26/2014    Procedure: ROBOTIC ASSISTED LAPAROSCOPIC COMPLETE CYSTECT ILEAL CONDUIT;  Surgeon: Alexis Frock, MD;  Location: [facility];  Service: Urology;  Laterality: N/A;  . Robotic assisted laparoscopic hysterectomy and salpingectomy N/A 06/26/2014    Procedure: ROBOTIC ASSISTED LAPAROSCOPIC HYSTERECTOMY AND BILATERAL SALPINGO-OPHERECTOMY;  Surgeon: Alexis Frock, MD;  Location: [facility];  Service: Urology;  Laterality: N/A;  . Cystoscopy with injection N/A 06/26/2014    Procedure: CYSTOSCOPY WITH INJECTION OF INDOCYANINE GREEN DYE;  Surgeon: Alexis Frock, MD;  Location: [facility];  Service: Urology;  Laterality: N/A;   Social History:   reports that she has quit smoking. She has never used smokeless tobacco. She reports that she drinks alcohol. She reports that she does not use illicit drugs.  Family History  Problem Relation Age of Onset  . Bladder Cancer Neg Hx   . Stroke Maternal Aunt     Medications:   Medication List       This list is accurate as of: 09/07/14  5:15 PM.  Always use your most recent med list.  losartan 100 MG tablet  Commonly known as:  COZAAR  Take 1 tablet (100 mg total) by mouth daily.     senna-docusate 8.6-50 MG per tablet  Commonly known as:  Senokot-S  Take 1 tablet by mouth at bedtime as needed for mild constipation. While taking pain meds to prevent constipation     SYNTHROID 88 MCG tablet  Generic drug:  levothyroxine  Take 88 mcg by mouth every morning.     traMADol 50 MG tablet  Commonly known as:  ULTRAM  Take 1 tablet (50 mg total) by mouth every 12 (twelve)  hours as needed for severe pain.         Physical Exam: Filed Vitals:   09/07/14 1649  BP: 110/78  Pulse: 78  Temp: 98.7 F (37.1 C)  Resp: 18  Weight: 124 lb 12.8 oz (56.609 kg)  SpO2: 98%    General- elderly female, thin built, frail, in no acute distress Head- normocephalic, atraumatic Throat- moist mucus membrane Eyes- PERRLA, EOMI, no pallor, no icterus, no discharge, normal conjunctiva, normal sclera Neck- no cervical lymphadenopathy Cardiovascular- normal s1,s2, no murmurs, palpable dorsalis pedis and radial pulses, no leg edema Respiratory- bilateral clear to auscultation, no wheeze, no rhonchi, no crackles, no use of accessory muscles Abdomen- bowel sounds present, soft, non tender, ostomy site clean Musculoskeletal- able to move all 4 extremities, generalized weakness  Neurological- no focal deficit Skin- warm and dry, foley has good urine output Psychiatry- alert and oriented to person, place and time, normal mood and affect     Labs reviewed: Basic Metabolic Panel:  Recent Labs  09/01/14 0531 09/02/14 0533 09/03/14 0539  NA 136 136 136  K 3.5 3.6 4.2  CL 103 104 104  CO2 24 26 27   GLUCOSE 96 99 99  BUN 41* 36* 35*  CREATININE 0.98 1.06* 0.75  CALCIUM 9.9 9.6 9.8   Liver Function Tests:  Recent Labs  06/25/14 1511 08/31/14 1826 09/01/14 0531  AST 27 26 16   ALT 17 23 16   ALKPHOS 60 63 46  BILITOT 0.6 0.8 0.7  PROT 7.3 8.0 5.6*  ALBUMIN 4.5 3.8 2.7*   No results for input(s): LIPASE, AMYLASE in the last 8760 hours.  Recent Labs  08/31/14 1850  AMMONIA 14   CBC:  Recent Labs  08/31/14 1826 08/31/14 2230 09/02/14 0533 09/03/14 0539  WBC 10.7* 9.1 5.6 5.1  NEUTROABS 6.5  --   --   --   HGB 12.6 10.9* 9.0* 9.9*  HCT 38.4 33.4* 28.4* 29.9*  MCV 85.9 86.1 87.7 88.2  PLT PLATELET CLUMPS NOTED ON SMEAR, COUNT APPEARS ADEQUATE 138* PLATELET CLUMPS NOTED ON SMEAR, UNABLE TO ESTIMATE 157   Cardiac Enzymes:  Recent Labs   08/31/14 2341  CKTOTAL 14*  TROPONINI <0.03   BNP: Invalid input(s): POCBNP CBG:  Recent Labs  04/30/14 0819 06/26/14 0529 06/26/14 1429  GLUCAP 104* 94 180*    Radiological Exams: Ct Head Wo Contrast  08/31/2014   CLINICAL DATA:  81 year old female with altered mental status. Symptoms following bladder surgery earlier this month. Initial encounter. Current history of bladder cancer.  EXAM: CT HEAD WITHOUT CONTRAST  TECHNIQUE: Contiguous axial images were obtained from the base of the skull through the vertex without intravenous contrast.  COMPARISON:  None.  FINDINGS: Visualized paranasal sinuses and mastoids are clear. No acute or suspicious calvarium lesion identified. No acute scalp or orbits soft tissue findings.  Calcified atherosclerosis at the skull base. Cerebral volume is within  normal limits for age. No ventriculomegaly. No midline shift, mass effect, or evidence of intracranial mass lesion. Gray-white matter differentiation is within normal limits for age. No cortically based acute infarct identified. No acute intracranial hemorrhage identified. No suspicious intracranial vascular hyperdensity.  IMPRESSION: Negative for age Normal noncontrast CT appearance of the brain.   Electronically Signed   By: Genevie Ann M.D.   On: 08/31/2014 19:18   Mr Brain Wo Contrast  09/01/2014   CLINICAL DATA:  Delirium  EXAM: MRI HEAD WITHOUT CONTRAST  TECHNIQUE: Multiplanar, multiecho pulse sequences of the brain and surrounding structures were obtained without intravenous contrast.  COMPARISON:  Head CT from yesterday  FINDINGS: Calvarium and upper cervical spine: Heterogeneous marrow without focal mass lesion.  Orbits: Bilateral cataract resection.  Sinuses and Mastoids: Clear. Mastoid and middle ears are clear.  Brain: No acute abnormality such as acute infarct, hemorrhage, hydrocephalus, or mass lesion. No evidence of large vessel occlusion.  Chronic small vessel disease with mild for age scattered  ischemic gliosis in the bilateral cerebral white matter, confluent periventricular. A remote lacunar infarct is noted in the left frontal white matter, periventricular. Generalized cerebral volume loss, moderate for age.  IMPRESSION: 1. No acute finding. 2. Brain atrophy and mild for age chronic small vessel disease.   Electronically Signed   By: Monte Fantasia M.D.   On: 09/01/2014 15:17   Dg Chest Port 1 View  09/01/2014   CLINICAL DATA:  Confusion and weakness.  EXAM: PORTABLE CHEST - 1 VIEW  COMPARISON:  None.  FINDINGS: Normal heart size and pulmonary vascularity. Bronchial wall thickening suggesting chronic bronchitis. No focal airspace disease or consolidation in the lungs. No blunting of costophrenic angles. No pneumothorax. Mediastinal contours appear intact. Calcified and tortuous aorta. Degenerative changes in the spine and shoulders.  IMPRESSION: Chronic bronchitic changes. No evidence of active pulmonary disease.   Electronically Signed   By: Lucienne Capers M.D.   On: 09/01/2014 00:23     Assessment/Plan  Generalized weakness Will have her work with physical therapy and occupational therapy team to help with gait training and muscle strengthening exercises.fall precautions. Skin care. Encourage to be out of bed.   Hypercalcemia Resolved with iv fluids, encouraged hydration, check bmp  UTI Completed course of cephalexin yesterday, asymptomatic. Monitor  Hypertension Stable bp, continue cozaar 100 mg daily , monitor bp  Hypothyroidism continue levothyroxine 88 mcg daily  Constipation Stable, had bowel movement this am. Continue senokot s qhs prn  Bladder cancer S/p surgery with ileal conduit. Continue prn tramadol for pain.   Goals of care: short term rehabilitation   Labs/tests ordered: bmp  Family/ staff Communication: reviewed care plan with patient and nursing supervisor    Blanchie Serve, MD  [facility] [phone] (Monday-Friday 8 am - 5  pm) [phone] (afterhours)

## 2014-09-11 ENCOUNTER — Encounter: Payer: Self-pay | Admitting: Adult Health

## 2014-09-11 ENCOUNTER — Non-Acute Institutional Stay (SKILLED_NURSING_FACILITY): Payer: Medicare Other | Admitting: Adult Health

## 2014-09-11 DIAGNOSIS — I10 Essential (primary) hypertension: Secondary | ICD-10-CM

## 2014-09-11 DIAGNOSIS — K5900 Constipation, unspecified: Secondary | ICD-10-CM

## 2014-09-11 DIAGNOSIS — R531 Weakness: Secondary | ICD-10-CM

## 2014-09-11 DIAGNOSIS — E039 Hypothyroidism, unspecified: Secondary | ICD-10-CM

## 2014-09-11 DIAGNOSIS — C679 Malignant neoplasm of bladder, unspecified: Secondary | ICD-10-CM

## 2014-09-14 NOTE — Progress Notes (Signed)
Patient ID: Victoria Patrick, female   DOB: [birth year], 81 y.o.   MRN: [id number]    DATE:  09/11/14 MRN:  [id number]  BIRTHDAY: 1922/10/15  Facility:  Nursing Home Location:  [place] Room Number: 1006-2  LEVEL OF CARE:  SNF [phone])  Contact Information    Name Relation Home Work Mobile   [place] [phone]  [phone]   Jiles Prows   [phone]      Chief Complaint  Patient presents with  . Discharge Note    Generalized weakness, hypertension, hypothyroidism, history of bladder cancer and constipation    HISTORY OF PRESENT ILLNESS:  This is an 81 year old female who is for discharge home with Home health PT, OT and Nursing. She has been admitted to [facility] on 09/03/14 from [facility]. She has PMH of hypertension, hypothyroidism, recent surgery for bladder cancer with ileal conduit in June. She was brought to the hospital due to increasing weakness/confusion and was diagnosed with UTI. She was started on antibiotics and given IV fluids for dehydration.   Patient was admitted to this facility for short-term rehabilitation after the patient's recent hospitalization.  Patient has completed SNF rehabilitation and therapy has cleared the patient for discharge.  PAST MEDICAL HISTORY:  Past Medical History  Diagnosis Date  . Hx of bronchitis     age 81   . Boils     hx of  years ago   . Hypertension   . Dysrhythmia     occasional skip   . Hypothyroidism   . Diabetes mellitus without complication     borderline on no meds   . Anxiety   . Cancer     hx of skin cancer on face     CURRENT MEDICATIONS: Reviewed  Patient's Medications  New Prescriptions   No medications on file  Previous Medications   LOSARTAN (COZAAR) 100 MG TABLET    Take 1 tablet (100 mg total) by mouth daily.   SENNA-DOCUSATE (SENOKOT-S) 8.6-50 MG PER TABLET    Take 1 tablet by mouth at bedtime as  needed for mild constipation. While taking pain meds to prevent constipation   SYNTHROID 88 MCG TABLET    Take 88 mcg by mouth every morning.    TRAMADOL (ULTRAM) 50 MG TABLET    Take 1 tablet (50 mg total) by mouth every 12 (twelve) hours as needed for severe pain.  Modified Medications   No medications on file  Discontinued Medications   No medications on file     Allergies  Allergen Reactions  . Diovan [Valsartan] Itching  . Codeine Nausea And Vomiting     REVIEW OF SYSTEMS:  GENERAL: no change in appetite, no fatigue, no weight changes, no fever, chills or weakness EYES: Denies change in vision, dry eyes, eye pain, itching or discharge EARS: Denies change in hearing, ringing in ears, or earache NOSE: Denies nasal congestion or epistaxis MOUTH and THROAT: Denies oral discomfort, gingival pain or bleeding, pain from teeth or hoarseness   RESPIRATORY: no cough, SOB, DOE, wheezing, hemoptysis CARDIAC: no chest pain, edema or palpitations GI: no abdominal pain, diarrhea, heart burn, nausea or vomiting PSYCHIATRIC: Denies feeling of depression or anxiety. No report of hallucinations, insomnia, paranoia, or agitation   PHYSICAL EXAMINATION  GENERAL APPEARANCE: Well nourished. In no acute distress. Normal body habitus HEAD: Normal in size and contour. No evidence of trauma EYES: Lids open and close  normally. No blepharitis, entropion or ectropion. PERRL. Conjunctivae are clear and sclerae are white. Lenses are without opacity EARS: Pinnae are normal. Patient hears normal voice tunes of the examiner MOUTH and THROAT: Lips are without lesions. Oral mucosa is moist and without lesions. Tongue is normal in shape, size, and color and without lesions NECK: supple, trachea midline, no neck masses, no thyroid tenderness, no thyromegaly LYMPHATICS: no LAN in the neck, no supraclavicular LAN RESPIRATORY: breathing is even & unlabored, BS CTAB CARDIAC: RRR, no murmur,no extra heart sounds,  no edema GI: abdomen soft, normal BS, no masses, no tenderness, no hepatomegaly, no splenomegaly GU:  Has ileal conduit draining to bag EXTREMITIES:  Able to move 4 extremities PSYCHIATRIC: Alert and oriented X 3. Affect and behavior are appropriate  LABS/RADIOLOGY: Labs reviewed: Basic Metabolic Panel:  Recent Labs  09/01/14 0531 09/02/14 0533 09/03/14 0539  NA 136 136 136  K 3.5 3.6 4.2  CL 103 104 104  CO2 24 26 27   GLUCOSE 96 99 99  BUN 41* 36* 35*  CREATININE 0.98 1.06* 0.75  CALCIUM 9.9 9.6 9.8   Liver Function Tests:  Recent Labs  06/25/14 1511 08/31/14 1826 09/01/14 0531  AST 27 26 16   ALT 17 23 16   ALKPHOS 60 63 46  BILITOT 0.6 0.8 0.7  PROT 7.3 8.0 5.6*  ALBUMIN 4.5 3.8 2.7*    Recent Labs  08/31/14 1850  AMMONIA 14   CBC:  Recent Labs  08/31/14 1826 08/31/14 2230 09/02/14 0533 09/03/14 0539  WBC 10.7* 9.1 5.6 5.1  NEUTROABS 6.5  --   --   --   HGB 12.6 10.9* 9.0* 9.9*  HCT 38.4 33.4* 28.4* 29.9*  MCV 85.9 86.1 87.7 88.2  PLT PLATELET CLUMPS NOTED ON SMEAR, COUNT APPEARS ADEQUATE 138* PLATELET CLUMPS NOTED ON SMEAR, UNABLE TO ESTIMATE 157   Cardiac Enzymes:  Recent Labs  08/31/14 2341  CKTOTAL 14*  TROPONINI <0.03   CBG:  Recent Labs  04/30/14 0819 06/26/14 0529 06/26/14 1429  GLUCAP 104* 94 180*     Ct Head Wo Contrast  08/31/2014   CLINICAL DATA:  81 year old female with altered mental status. Symptoms following bladder surgery earlier this month. Initial encounter. Current history of bladder cancer.  EXAM: CT HEAD WITHOUT CONTRAST  TECHNIQUE: Contiguous axial images were obtained from the base of the skull through the vertex without intravenous contrast.  COMPARISON:  None.  FINDINGS: Visualized paranasal sinuses and mastoids are clear. No acute or suspicious calvarium lesion identified. No acute scalp or orbits soft tissue findings.  Calcified atherosclerosis at the skull base. Cerebral volume is within normal limits for  age. No ventriculomegaly. No midline shift, mass effect, or evidence of intracranial mass lesion. Gray-white matter differentiation is within normal limits for age. No cortically based acute infarct identified. No acute intracranial hemorrhage identified. No suspicious intracranial vascular hyperdensity.  IMPRESSION: Negative for age Normal noncontrast CT appearance of the brain.   Electronically Signed   By: Genevie Ann M.D.   On: 08/31/2014 19:18   Mr Brain Wo Contrast  09/01/2014   CLINICAL DATA:  Delirium  EXAM: MRI HEAD WITHOUT CONTRAST  TECHNIQUE: Multiplanar, multiecho pulse sequences of the brain and surrounding structures were obtained without intravenous contrast.  COMPARISON:  Head CT from yesterday  FINDINGS: Calvarium and upper cervical spine: Heterogeneous marrow without focal mass lesion.  Orbits: Bilateral cataract resection.  Sinuses and Mastoids: Clear. Mastoid and middle ears are clear.  Brain: No acute abnormality such  as acute infarct, hemorrhage, hydrocephalus, or mass lesion. No evidence of large vessel occlusion.  Chronic small vessel disease with mild for age scattered ischemic gliosis in the bilateral cerebral white matter, confluent periventricular. A remote lacunar infarct is noted in the left frontal white matter, periventricular. Generalized cerebral volume loss, moderate for age.  IMPRESSION: 1. No acute finding. 2. Brain atrophy and mild for age chronic small vessel disease.   Electronically Signed   By: Monte Fantasia M.D.   On: 09/01/2014 15:17   Dg Chest Port 1 View  09/01/2014   CLINICAL DATA:  Confusion and weakness.  EXAM: PORTABLE CHEST - 1 VIEW  COMPARISON:  None.  FINDINGS: Normal heart size and pulmonary vascularity. Bronchial wall thickening suggesting chronic bronchitis. No focal airspace disease or consolidation in the lungs. No blunting of costophrenic angles. No pneumothorax. Mediastinal contours appear intact. Calcified and tortuous aorta. Degenerative changes in  the spine and shoulders.  IMPRESSION: Chronic bronchitic changes. No evidence of active pulmonary disease.   Electronically Signed   By: Lucienne Capers M.D.   On: 09/01/2014 00:23    ASSESSMENT/PLAN:  Generalized weakness - for home health PT, OT and Nursing  UTI - Resolved  Essential hypertension - continue losartan 100 mg 1 tab by mouth daily; check BMP in 1 week  Hypothyroidism - TSH 1.326; continue Synthroid 88 g 1 tab by mouth daily  Bladder cancer -  S/P surgery with ileal conduit  Constipation - continue senna S 2 tabs by mouth Q HS and Miralax 17 gm PO BID PRN      I have filled out patient's discharge paperwork and written prescriptions.  Patient will receive home health PT, OT and Nursing.  Total discharge time: Less than 30 minutes  Discharge time involved coordination of the discharge process with Education officer, museum, nursing staff and therapy department. Medical justification for home health services verified.    [facility], NP [employer] [phone]

## 2014-09-17 DIAGNOSIS — F419 Anxiety disorder, unspecified: Secondary | ICD-10-CM | POA: Diagnosis not present

## 2014-09-17 DIAGNOSIS — I10 Essential (primary) hypertension: Secondary | ICD-10-CM | POA: Diagnosis not present

## 2014-09-17 DIAGNOSIS — Z436 Encounter for attention to other artificial openings of urinary tract: Secondary | ICD-10-CM | POA: Diagnosis not present

## 2014-09-17 DIAGNOSIS — C679 Malignant neoplasm of bladder, unspecified: Secondary | ICD-10-CM | POA: Diagnosis not present

## 2014-09-17 DIAGNOSIS — E039 Hypothyroidism, unspecified: Secondary | ICD-10-CM | POA: Diagnosis not present

## 2014-09-17 DIAGNOSIS — G3184 Mild cognitive impairment, so stated: Secondary | ICD-10-CM | POA: Diagnosis not present

## 2014-09-18 DIAGNOSIS — G3184 Mild cognitive impairment, so stated: Secondary | ICD-10-CM | POA: Diagnosis not present

## 2014-09-18 DIAGNOSIS — I10 Essential (primary) hypertension: Secondary | ICD-10-CM | POA: Diagnosis not present

## 2014-09-18 DIAGNOSIS — Z436 Encounter for attention to other artificial openings of urinary tract: Secondary | ICD-10-CM | POA: Diagnosis not present

## 2014-09-18 DIAGNOSIS — F419 Anxiety disorder, unspecified: Secondary | ICD-10-CM | POA: Diagnosis not present

## 2014-09-18 DIAGNOSIS — E039 Hypothyroidism, unspecified: Secondary | ICD-10-CM | POA: Diagnosis not present

## 2014-09-18 DIAGNOSIS — C679 Malignant neoplasm of bladder, unspecified: Secondary | ICD-10-CM | POA: Diagnosis not present

## 2014-09-21 DIAGNOSIS — E039 Hypothyroidism, unspecified: Secondary | ICD-10-CM | POA: Diagnosis not present

## 2014-09-21 DIAGNOSIS — F419 Anxiety disorder, unspecified: Secondary | ICD-10-CM | POA: Diagnosis not present

## 2014-09-21 DIAGNOSIS — I10 Essential (primary) hypertension: Secondary | ICD-10-CM | POA: Diagnosis not present

## 2014-09-21 DIAGNOSIS — C679 Malignant neoplasm of bladder, unspecified: Secondary | ICD-10-CM | POA: Diagnosis not present

## 2014-09-21 DIAGNOSIS — G3184 Mild cognitive impairment, so stated: Secondary | ICD-10-CM | POA: Diagnosis not present

## 2014-09-21 DIAGNOSIS — Z436 Encounter for attention to other artificial openings of urinary tract: Secondary | ICD-10-CM | POA: Diagnosis not present

## 2014-09-23 DIAGNOSIS — I10 Essential (primary) hypertension: Secondary | ICD-10-CM | POA: Diagnosis not present

## 2014-09-23 DIAGNOSIS — F419 Anxiety disorder, unspecified: Secondary | ICD-10-CM | POA: Diagnosis not present

## 2014-09-23 DIAGNOSIS — G3184 Mild cognitive impairment, so stated: Secondary | ICD-10-CM | POA: Diagnosis not present

## 2014-09-23 DIAGNOSIS — E039 Hypothyroidism, unspecified: Secondary | ICD-10-CM | POA: Diagnosis not present

## 2014-09-23 DIAGNOSIS — C679 Malignant neoplasm of bladder, unspecified: Secondary | ICD-10-CM | POA: Diagnosis not present

## 2014-09-23 DIAGNOSIS — Z436 Encounter for attention to other artificial openings of urinary tract: Secondary | ICD-10-CM | POA: Diagnosis not present

## 2014-09-25 DIAGNOSIS — Z23 Encounter for immunization: Secondary | ICD-10-CM | POA: Diagnosis not present

## 2014-09-25 DIAGNOSIS — C679 Malignant neoplasm of bladder, unspecified: Secondary | ICD-10-CM | POA: Diagnosis not present

## 2014-09-25 DIAGNOSIS — Z436 Encounter for attention to other artificial openings of urinary tract: Secondary | ICD-10-CM | POA: Diagnosis not present

## 2014-09-25 DIAGNOSIS — Z9889 Other specified postprocedural states: Secondary | ICD-10-CM | POA: Diagnosis not present

## 2014-10-01 DIAGNOSIS — F418 Other specified anxiety disorders: Secondary | ICD-10-CM | POA: Diagnosis not present

## 2014-10-01 DIAGNOSIS — C679 Malignant neoplasm of bladder, unspecified: Secondary | ICD-10-CM | POA: Diagnosis not present

## 2014-10-01 DIAGNOSIS — I10 Essential (primary) hypertension: Secondary | ICD-10-CM | POA: Diagnosis not present

## 2014-10-01 DIAGNOSIS — Z936 Other artificial openings of urinary tract status: Secondary | ICD-10-CM | POA: Diagnosis not present

## 2014-10-01 DIAGNOSIS — G8929 Other chronic pain: Secondary | ICD-10-CM | POA: Diagnosis not present

## 2014-10-01 DIAGNOSIS — F419 Anxiety disorder, unspecified: Secondary | ICD-10-CM | POA: Diagnosis not present

## 2014-10-01 DIAGNOSIS — Z87440 Personal history of urinary (tract) infections: Secondary | ICD-10-CM | POA: Diagnosis not present

## 2014-10-04 DIAGNOSIS — F419 Anxiety disorder, unspecified: Secondary | ICD-10-CM | POA: Diagnosis not present

## 2014-10-04 DIAGNOSIS — Z87440 Personal history of urinary (tract) infections: Secondary | ICD-10-CM | POA: Diagnosis not present

## 2014-10-04 DIAGNOSIS — C679 Malignant neoplasm of bladder, unspecified: Secondary | ICD-10-CM | POA: Diagnosis not present

## 2014-10-04 DIAGNOSIS — G8929 Other chronic pain: Secondary | ICD-10-CM | POA: Diagnosis not present

## 2014-10-04 DIAGNOSIS — Z936 Other artificial openings of urinary tract status: Secondary | ICD-10-CM | POA: Diagnosis not present

## 2014-10-04 DIAGNOSIS — I10 Essential (primary) hypertension: Secondary | ICD-10-CM | POA: Diagnosis not present

## 2014-10-05 DIAGNOSIS — I10 Essential (primary) hypertension: Secondary | ICD-10-CM | POA: Diagnosis not present

## 2014-10-05 DIAGNOSIS — G8929 Other chronic pain: Secondary | ICD-10-CM | POA: Diagnosis not present

## 2014-10-05 DIAGNOSIS — F419 Anxiety disorder, unspecified: Secondary | ICD-10-CM | POA: Diagnosis not present

## 2014-10-05 DIAGNOSIS — C679 Malignant neoplasm of bladder, unspecified: Secondary | ICD-10-CM | POA: Diagnosis not present

## 2014-10-05 DIAGNOSIS — Z87440 Personal history of urinary (tract) infections: Secondary | ICD-10-CM | POA: Diagnosis not present

## 2014-10-05 DIAGNOSIS — Z936 Other artificial openings of urinary tract status: Secondary | ICD-10-CM | POA: Diagnosis not present

## 2014-10-12 DIAGNOSIS — Z936 Other artificial openings of urinary tract status: Secondary | ICD-10-CM | POA: Diagnosis not present

## 2014-10-12 DIAGNOSIS — C679 Malignant neoplasm of bladder, unspecified: Secondary | ICD-10-CM | POA: Diagnosis not present

## 2014-10-12 DIAGNOSIS — F419 Anxiety disorder, unspecified: Secondary | ICD-10-CM | POA: Diagnosis not present

## 2014-10-12 DIAGNOSIS — Z87440 Personal history of urinary (tract) infections: Secondary | ICD-10-CM | POA: Diagnosis not present

## 2014-10-12 DIAGNOSIS — G8929 Other chronic pain: Secondary | ICD-10-CM | POA: Diagnosis not present

## 2014-10-12 DIAGNOSIS — I10 Essential (primary) hypertension: Secondary | ICD-10-CM | POA: Diagnosis not present

## 2014-10-16 ENCOUNTER — Other Ambulatory Visit: Payer: Self-pay | Admitting: Adult Health

## 2014-10-19 DIAGNOSIS — C679 Malignant neoplasm of bladder, unspecified: Secondary | ICD-10-CM | POA: Diagnosis not present

## 2014-10-19 DIAGNOSIS — F419 Anxiety disorder, unspecified: Secondary | ICD-10-CM | POA: Diagnosis not present

## 2014-10-19 DIAGNOSIS — I10 Essential (primary) hypertension: Secondary | ICD-10-CM | POA: Diagnosis not present

## 2014-10-19 DIAGNOSIS — Z936 Other artificial openings of urinary tract status: Secondary | ICD-10-CM | POA: Diagnosis not present

## 2014-10-19 DIAGNOSIS — Z87440 Personal history of urinary (tract) infections: Secondary | ICD-10-CM | POA: Diagnosis not present

## 2014-10-19 DIAGNOSIS — G8929 Other chronic pain: Secondary | ICD-10-CM | POA: Diagnosis not present

## 2014-10-20 DIAGNOSIS — I10 Essential (primary) hypertension: Secondary | ICD-10-CM | POA: Diagnosis not present

## 2014-10-20 DIAGNOSIS — F411 Generalized anxiety disorder: Secondary | ICD-10-CM | POA: Diagnosis not present

## 2014-10-20 DIAGNOSIS — R413 Other amnesia: Secondary | ICD-10-CM | POA: Diagnosis not present

## 2014-10-26 DIAGNOSIS — Z87440 Personal history of urinary (tract) infections: Secondary | ICD-10-CM | POA: Diagnosis not present

## 2014-10-26 DIAGNOSIS — G8929 Other chronic pain: Secondary | ICD-10-CM | POA: Diagnosis not present

## 2014-10-26 DIAGNOSIS — Z936 Other artificial openings of urinary tract status: Secondary | ICD-10-CM | POA: Diagnosis not present

## 2014-10-26 DIAGNOSIS — I10 Essential (primary) hypertension: Secondary | ICD-10-CM | POA: Diagnosis not present

## 2014-10-26 DIAGNOSIS — C679 Malignant neoplasm of bladder, unspecified: Secondary | ICD-10-CM | POA: Diagnosis not present

## 2014-10-26 DIAGNOSIS — F419 Anxiety disorder, unspecified: Secondary | ICD-10-CM | POA: Diagnosis not present

## 2014-11-02 DIAGNOSIS — Z87440 Personal history of urinary (tract) infections: Secondary | ICD-10-CM | POA: Diagnosis not present

## 2014-11-02 DIAGNOSIS — Z936 Other artificial openings of urinary tract status: Secondary | ICD-10-CM | POA: Diagnosis not present

## 2014-11-02 DIAGNOSIS — C679 Malignant neoplasm of bladder, unspecified: Secondary | ICD-10-CM | POA: Diagnosis not present

## 2014-11-02 DIAGNOSIS — I10 Essential (primary) hypertension: Secondary | ICD-10-CM | POA: Diagnosis not present

## 2014-11-02 DIAGNOSIS — F419 Anxiety disorder, unspecified: Secondary | ICD-10-CM | POA: Diagnosis not present

## 2014-11-02 DIAGNOSIS — G8929 Other chronic pain: Secondary | ICD-10-CM | POA: Diagnosis not present

## 2014-11-12 DIAGNOSIS — G8929 Other chronic pain: Secondary | ICD-10-CM | POA: Diagnosis not present

## 2014-11-12 DIAGNOSIS — F419 Anxiety disorder, unspecified: Secondary | ICD-10-CM | POA: Diagnosis not present

## 2014-11-12 DIAGNOSIS — I10 Essential (primary) hypertension: Secondary | ICD-10-CM | POA: Diagnosis not present

## 2014-11-12 DIAGNOSIS — Z936 Other artificial openings of urinary tract status: Secondary | ICD-10-CM | POA: Diagnosis not present

## 2014-11-12 DIAGNOSIS — C679 Malignant neoplasm of bladder, unspecified: Secondary | ICD-10-CM | POA: Diagnosis not present

## 2014-11-12 DIAGNOSIS — Z87440 Personal history of urinary (tract) infections: Secondary | ICD-10-CM | POA: Diagnosis not present

## 2014-11-18 DIAGNOSIS — L309 Dermatitis, unspecified: Secondary | ICD-10-CM | POA: Diagnosis not present

## 2014-11-19 DIAGNOSIS — I10 Essential (primary) hypertension: Secondary | ICD-10-CM | POA: Diagnosis not present

## 2014-11-19 DIAGNOSIS — F419 Anxiety disorder, unspecified: Secondary | ICD-10-CM | POA: Diagnosis not present

## 2014-11-19 DIAGNOSIS — Z87440 Personal history of urinary (tract) infections: Secondary | ICD-10-CM | POA: Diagnosis not present

## 2014-11-19 DIAGNOSIS — G8929 Other chronic pain: Secondary | ICD-10-CM | POA: Diagnosis not present

## 2014-11-19 DIAGNOSIS — Z936 Other artificial openings of urinary tract status: Secondary | ICD-10-CM | POA: Diagnosis not present

## 2014-11-19 DIAGNOSIS — C679 Malignant neoplasm of bladder, unspecified: Secondary | ICD-10-CM | POA: Diagnosis not present

## 2014-11-24 DIAGNOSIS — C679 Malignant neoplasm of bladder, unspecified: Secondary | ICD-10-CM | POA: Diagnosis not present

## 2014-11-24 DIAGNOSIS — F419 Anxiety disorder, unspecified: Secondary | ICD-10-CM | POA: Diagnosis not present

## 2014-11-24 DIAGNOSIS — G8929 Other chronic pain: Secondary | ICD-10-CM | POA: Diagnosis not present

## 2014-11-24 DIAGNOSIS — Z87440 Personal history of urinary (tract) infections: Secondary | ICD-10-CM | POA: Diagnosis not present

## 2014-11-24 DIAGNOSIS — Z936 Other artificial openings of urinary tract status: Secondary | ICD-10-CM | POA: Diagnosis not present

## 2014-11-24 DIAGNOSIS — I10 Essential (primary) hypertension: Secondary | ICD-10-CM | POA: Diagnosis not present

## 2014-11-30 DIAGNOSIS — Z436 Encounter for attention to other artificial openings of urinary tract: Secondary | ICD-10-CM | POA: Diagnosis not present

## 2014-11-30 DIAGNOSIS — Z906 Acquired absence of other parts of urinary tract: Secondary | ICD-10-CM | POA: Diagnosis not present

## 2014-11-30 DIAGNOSIS — C679 Malignant neoplasm of bladder, unspecified: Secondary | ICD-10-CM | POA: Diagnosis not present

## 2014-11-30 DIAGNOSIS — I10 Essential (primary) hypertension: Secondary | ICD-10-CM | POA: Diagnosis not present

## 2014-11-30 DIAGNOSIS — Z87440 Personal history of urinary (tract) infections: Secondary | ICD-10-CM | POA: Diagnosis not present

## 2014-12-08 DIAGNOSIS — C679 Malignant neoplasm of bladder, unspecified: Secondary | ICD-10-CM | POA: Diagnosis not present

## 2014-12-08 DIAGNOSIS — Z906 Acquired absence of other parts of urinary tract: Secondary | ICD-10-CM | POA: Diagnosis not present

## 2014-12-08 DIAGNOSIS — I10 Essential (primary) hypertension: Secondary | ICD-10-CM | POA: Diagnosis not present

## 2014-12-08 DIAGNOSIS — Z87440 Personal history of urinary (tract) infections: Secondary | ICD-10-CM | POA: Diagnosis not present

## 2014-12-08 DIAGNOSIS — Z436 Encounter for attention to other artificial openings of urinary tract: Secondary | ICD-10-CM | POA: Diagnosis not present

## 2014-12-15 DIAGNOSIS — F419 Anxiety disorder, unspecified: Secondary | ICD-10-CM | POA: Diagnosis not present

## 2014-12-15 DIAGNOSIS — Z906 Acquired absence of other parts of urinary tract: Secondary | ICD-10-CM | POA: Diagnosis not present

## 2014-12-15 DIAGNOSIS — Z936 Other artificial openings of urinary tract status: Secondary | ICD-10-CM | POA: Diagnosis not present

## 2014-12-15 DIAGNOSIS — R413 Other amnesia: Secondary | ICD-10-CM | POA: Diagnosis not present

## 2014-12-15 DIAGNOSIS — Z87440 Personal history of urinary (tract) infections: Secondary | ICD-10-CM | POA: Diagnosis not present

## 2014-12-15 DIAGNOSIS — Z436 Encounter for attention to other artificial openings of urinary tract: Secondary | ICD-10-CM | POA: Diagnosis not present

## 2014-12-15 DIAGNOSIS — I10 Essential (primary) hypertension: Secondary | ICD-10-CM | POA: Diagnosis not present

## 2014-12-15 DIAGNOSIS — L679 Hair color and hair shaft abnormality, unspecified: Secondary | ICD-10-CM | POA: Diagnosis not present

## 2014-12-15 DIAGNOSIS — C679 Malignant neoplasm of bladder, unspecified: Secondary | ICD-10-CM | POA: Diagnosis not present

## 2014-12-17 DIAGNOSIS — I10 Essential (primary) hypertension: Secondary | ICD-10-CM | POA: Diagnosis not present

## 2014-12-17 DIAGNOSIS — C679 Malignant neoplasm of bladder, unspecified: Secondary | ICD-10-CM | POA: Diagnosis not present

## 2014-12-21 DIAGNOSIS — Z87440 Personal history of urinary (tract) infections: Secondary | ICD-10-CM | POA: Diagnosis not present

## 2014-12-21 DIAGNOSIS — C679 Malignant neoplasm of bladder, unspecified: Secondary | ICD-10-CM | POA: Diagnosis not present

## 2014-12-21 DIAGNOSIS — I10 Essential (primary) hypertension: Secondary | ICD-10-CM | POA: Diagnosis not present

## 2014-12-21 DIAGNOSIS — Z436 Encounter for attention to other artificial openings of urinary tract: Secondary | ICD-10-CM | POA: Diagnosis not present

## 2014-12-21 DIAGNOSIS — Z906 Acquired absence of other parts of urinary tract: Secondary | ICD-10-CM | POA: Diagnosis not present

## 2015-02-04 ENCOUNTER — Other Ambulatory Visit: Payer: Self-pay | Admitting: Urology

## 2015-02-04 ENCOUNTER — Ambulatory Visit (HOSPITAL_COMMUNITY)
Admission: RE | Admit: 2015-02-04 | Discharge: 2015-02-04 | Disposition: A | Discharge status: Home / Self Care | Payer: Medicare Other | Source: Ambulatory Visit | Attending: Urology | Admitting: Urology

## 2015-02-04 DIAGNOSIS — I10 Essential (primary) hypertension: Secondary | ICD-10-CM | POA: Diagnosis not present

## 2015-02-04 DIAGNOSIS — C670 Malignant neoplasm of trigone of bladder: Secondary | ICD-10-CM | POA: Insufficient documentation

## 2015-02-04 DIAGNOSIS — D494 Neoplasm of unspecified behavior of bladder: Secondary | ICD-10-CM | POA: Diagnosis not present

## 2015-02-04 DIAGNOSIS — N289 Disorder of kidney and ureter, unspecified: Secondary | ICD-10-CM | POA: Diagnosis not present

## 2015-02-04 DIAGNOSIS — I517 Cardiomegaly: Secondary | ICD-10-CM | POA: Diagnosis not present

## 2015-02-11 DIAGNOSIS — C670 Malignant neoplasm of trigone of bladder: Secondary | ICD-10-CM | POA: Diagnosis not present

## 2015-02-11 DIAGNOSIS — R310 Gross hematuria: Secondary | ICD-10-CM | POA: Diagnosis not present

## 2015-04-19 DIAGNOSIS — Z0000 Encounter for general adult medical examination without abnormal findings: Secondary | ICD-10-CM | POA: Diagnosis not present

## 2015-04-19 DIAGNOSIS — I10 Essential (primary) hypertension: Secondary | ICD-10-CM | POA: Diagnosis not present

## 2015-04-19 DIAGNOSIS — N390 Urinary tract infection, site not specified: Secondary | ICD-10-CM | POA: Diagnosis not present

## 2015-04-22 DIAGNOSIS — R9431 Abnormal electrocardiogram [ECG] [EKG]: Secondary | ICD-10-CM | POA: Diagnosis not present

## 2015-04-22 DIAGNOSIS — I10 Essential (primary) hypertension: Secondary | ICD-10-CM | POA: Diagnosis not present

## 2015-04-22 DIAGNOSIS — M549 Dorsalgia, unspecified: Secondary | ICD-10-CM | POA: Diagnosis not present

## 2015-04-22 DIAGNOSIS — E039 Hypothyroidism, unspecified: Secondary | ICD-10-CM | POA: Diagnosis not present

## 2015-08-06 ENCOUNTER — Ambulatory Visit (HOSPITAL_COMMUNITY)
Admission: RE | Admit: 2015-08-06 | Discharge: 2015-08-06 | Disposition: A | Discharge status: Home / Self Care | Payer: Medicare Other | Source: Ambulatory Visit | Attending: Urology | Admitting: Urology

## 2015-08-06 ENCOUNTER — Other Ambulatory Visit: Payer: Self-pay | Admitting: Urology

## 2015-08-06 DIAGNOSIS — C670 Malignant neoplasm of trigone of bladder: Secondary | ICD-10-CM

## 2015-08-06 DIAGNOSIS — D3502 Benign neoplasm of left adrenal gland: Secondary | ICD-10-CM | POA: Diagnosis not present

## 2015-08-06 DIAGNOSIS — I517 Cardiomegaly: Secondary | ICD-10-CM | POA: Diagnosis not present

## 2015-08-06 DIAGNOSIS — R310 Gross hematuria: Secondary | ICD-10-CM | POA: Diagnosis not present

## 2015-08-06 DIAGNOSIS — D414 Neoplasm of uncertain behavior of bladder: Secondary | ICD-10-CM | POA: Diagnosis not present

## 2015-08-10 DIAGNOSIS — C670 Malignant neoplasm of trigone of bladder: Secondary | ICD-10-CM | POA: Diagnosis not present

## 2015-08-10 DIAGNOSIS — Z932 Ileostomy status: Secondary | ICD-10-CM | POA: Diagnosis not present

## 2015-09-07 ENCOUNTER — Other Ambulatory Visit: Payer: Self-pay

## 2015-09-16 DIAGNOSIS — Z23 Encounter for immunization: Secondary | ICD-10-CM | POA: Diagnosis not present

## 2015-11-24 IMAGING — DX DG CHEST 1V PORT
1 series · 1 of 1 positions shown · non-contrast
Comparison: None.

CLINICAL DATA: Confusion and weakness.

EXAM:
PORTABLE CHEST - 1 VIEW

[chest ap]
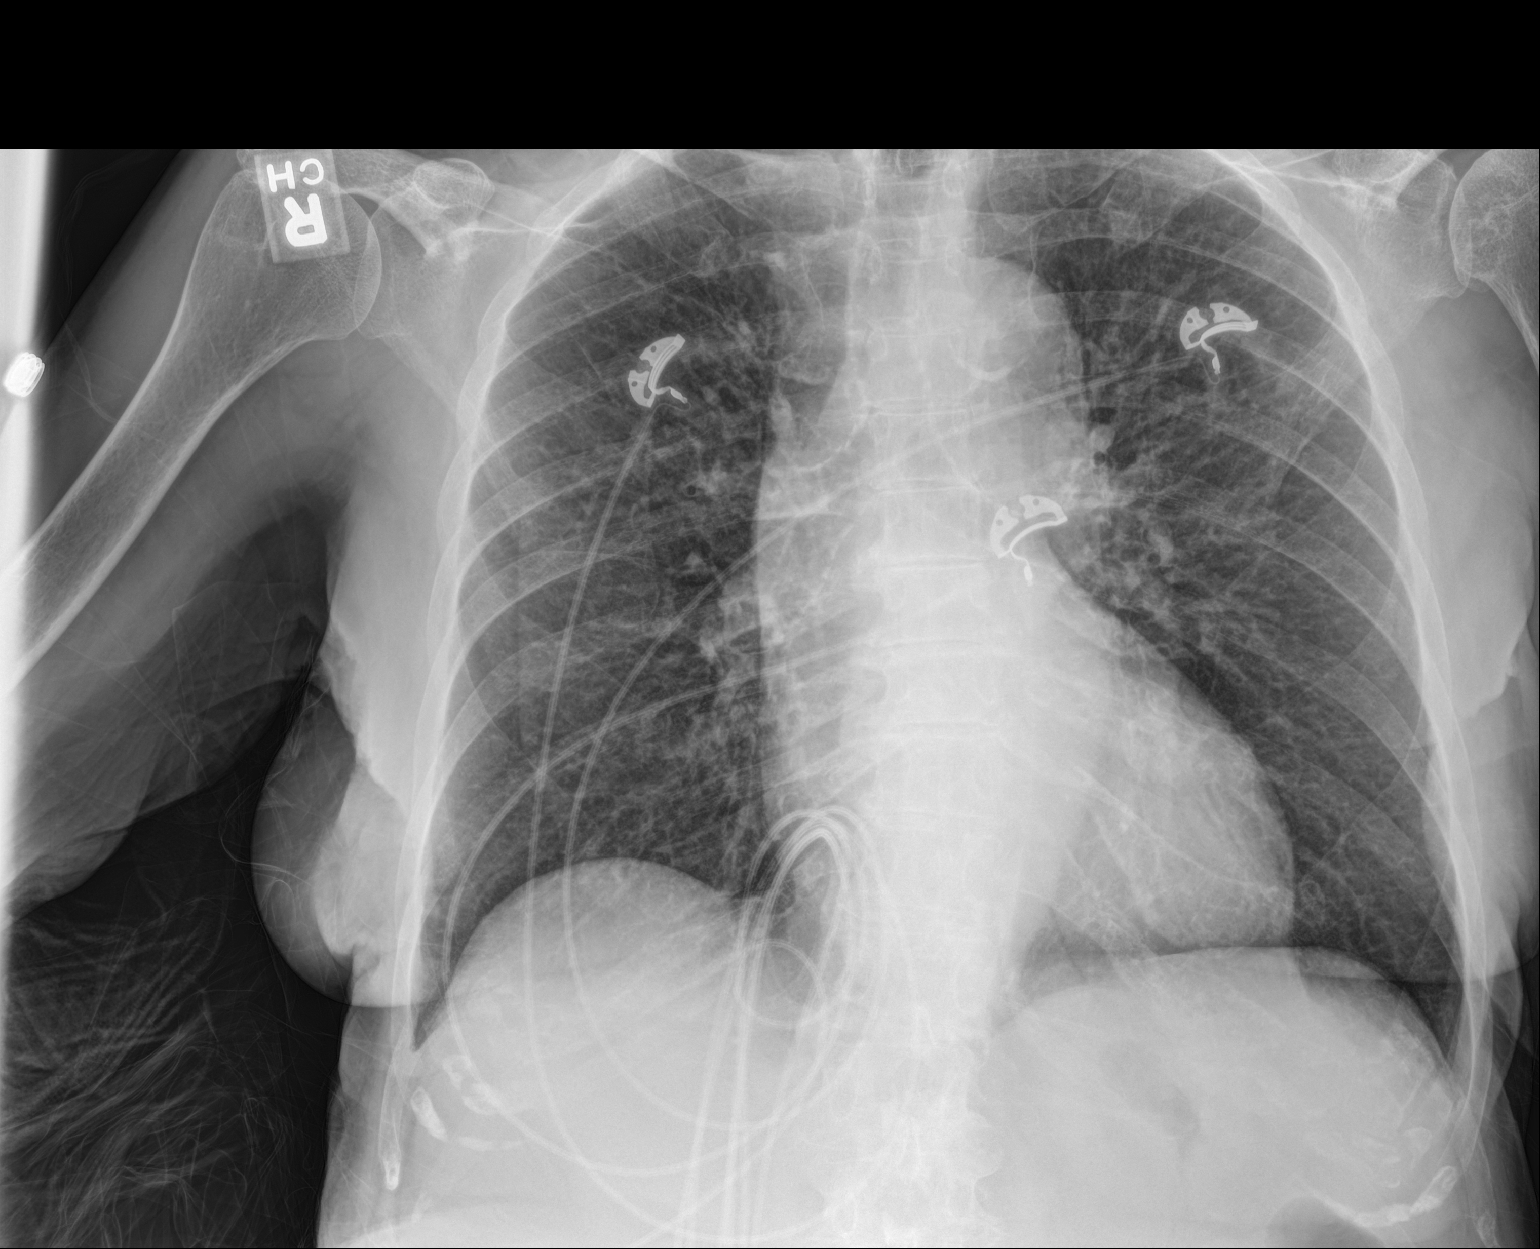

[1 of 1 positions shown; findings below may reference images not displayed]

FINDINGS: Normal heart size and pulmonary vascularity. Bronchial wall
thickening suggesting chronic bronchitis. No focal airspace disease
or consolidation in the lungs. No blunting of costophrenic angles.
No pneumothorax. Mediastinal contours appear intact. Calcified and
tortuous aorta. Degenerative changes in the spine and shoulders.
IMPRESSION: Chronic bronchitic changes. No evidence of active pulmonary disease.

## 2016-02-04 ENCOUNTER — Other Ambulatory Visit: Payer: Self-pay | Admitting: Urology

## 2016-02-04 ENCOUNTER — Ambulatory Visit (HOSPITAL_COMMUNITY)
Admission: RE | Admit: 2016-02-04 | Discharge: 2016-02-04 | Disposition: A | Discharge status: Home / Self Care | Payer: Medicare Other | Source: Ambulatory Visit | Attending: Urology | Admitting: Urology

## 2016-02-04 DIAGNOSIS — C679 Malignant neoplasm of bladder, unspecified: Secondary | ICD-10-CM | POA: Diagnosis not present

## 2016-02-04 DIAGNOSIS — C670 Malignant neoplasm of trigone of bladder: Secondary | ICD-10-CM | POA: Diagnosis not present

## 2016-02-07 DIAGNOSIS — C670 Malignant neoplasm of trigone of bladder: Secondary | ICD-10-CM | POA: Diagnosis not present

## 2016-02-07 DIAGNOSIS — K449 Diaphragmatic hernia without obstruction or gangrene: Secondary | ICD-10-CM | POA: Diagnosis not present

## 2016-02-14 DIAGNOSIS — C670 Malignant neoplasm of trigone of bladder: Secondary | ICD-10-CM | POA: Diagnosis not present

## 2016-03-28 DIAGNOSIS — H353211 Exudative age-related macular degeneration, right eye, with active choroidal neovascularization: Secondary | ICD-10-CM | POA: Diagnosis not present

## 2016-03-28 DIAGNOSIS — Z961 Presence of intraocular lens: Secondary | ICD-10-CM | POA: Diagnosis not present

## 2016-03-28 DIAGNOSIS — H26491 Other secondary cataract, right eye: Secondary | ICD-10-CM | POA: Diagnosis not present

## 2016-04-11 DIAGNOSIS — H353132 Nonexudative age-related macular degeneration, bilateral, intermediate dry stage: Secondary | ICD-10-CM | POA: Diagnosis not present

## 2016-04-11 DIAGNOSIS — H35361 Drusen (degenerative) of macula, right eye: Secondary | ICD-10-CM | POA: Diagnosis not present

## 2016-04-11 DIAGNOSIS — H353211 Exudative age-related macular degeneration, right eye, with active choroidal neovascularization: Secondary | ICD-10-CM | POA: Diagnosis not present

## 2016-04-11 DIAGNOSIS — H3561 Retinal hemorrhage, right eye: Secondary | ICD-10-CM | POA: Diagnosis not present

## 2016-04-19 DIAGNOSIS — E039 Hypothyroidism, unspecified: Secondary | ICD-10-CM | POA: Diagnosis not present

## 2016-04-19 DIAGNOSIS — N390 Urinary tract infection, site not specified: Secondary | ICD-10-CM | POA: Diagnosis not present

## 2016-04-19 DIAGNOSIS — I10 Essential (primary) hypertension: Secondary | ICD-10-CM | POA: Diagnosis not present

## 2016-04-19 DIAGNOSIS — Z0000 Encounter for general adult medical examination without abnormal findings: Secondary | ICD-10-CM | POA: Diagnosis not present

## 2016-04-24 DIAGNOSIS — Z932 Ileostomy status: Secondary | ICD-10-CM | POA: Diagnosis not present

## 2016-04-24 DIAGNOSIS — I10 Essential (primary) hypertension: Secondary | ICD-10-CM | POA: Diagnosis not present

## 2016-04-24 DIAGNOSIS — Z66 Do not resuscitate: Secondary | ICD-10-CM | POA: Diagnosis not present

## 2016-04-24 DIAGNOSIS — C670 Malignant neoplasm of trigone of bladder: Secondary | ICD-10-CM | POA: Diagnosis not present

## 2016-04-24 DIAGNOSIS — L570 Actinic keratosis: Secondary | ICD-10-CM | POA: Diagnosis not present

## 2016-05-16 DIAGNOSIS — H353211 Exudative age-related macular degeneration, right eye, with active choroidal neovascularization: Secondary | ICD-10-CM | POA: Diagnosis not present

## 2016-06-29 DIAGNOSIS — H353212 Exudative age-related macular degeneration, right eye, with inactive choroidal neovascularization: Secondary | ICD-10-CM | POA: Diagnosis not present

## 2016-06-29 DIAGNOSIS — H26492 Other secondary cataract, left eye: Secondary | ICD-10-CM | POA: Diagnosis not present

## 2016-06-29 DIAGNOSIS — H353122 Nonexudative age-related macular degeneration, left eye, intermediate dry stage: Secondary | ICD-10-CM | POA: Diagnosis not present

## 2016-06-29 DIAGNOSIS — H353112 Nonexudative age-related macular degeneration, right eye, intermediate dry stage: Secondary | ICD-10-CM | POA: Diagnosis not present

## 2016-08-09 ENCOUNTER — Other Ambulatory Visit: Payer: Self-pay | Admitting: Urology

## 2016-08-09 ENCOUNTER — Ambulatory Visit (HOSPITAL_COMMUNITY)
Admission: RE | Admit: 2016-08-09 | Discharge: 2016-08-09 | Disposition: A | Discharge status: Home / Self Care | Payer: Medicare Other | Source: Ambulatory Visit | Attending: Urology | Admitting: Urology

## 2016-08-09 DIAGNOSIS — C670 Malignant neoplasm of trigone of bladder: Secondary | ICD-10-CM | POA: Insufficient documentation

## 2016-08-09 DIAGNOSIS — R918 Other nonspecific abnormal finding of lung field: Secondary | ICD-10-CM | POA: Insufficient documentation

## 2016-08-09 DIAGNOSIS — I700 Atherosclerosis of aorta: Secondary | ICD-10-CM | POA: Insufficient documentation

## 2016-08-11 DIAGNOSIS — C670 Malignant neoplasm of trigone of bladder: Secondary | ICD-10-CM | POA: Diagnosis not present

## 2016-08-11 DIAGNOSIS — C679 Malignant neoplasm of bladder, unspecified: Secondary | ICD-10-CM | POA: Diagnosis not present

## 2016-08-14 DIAGNOSIS — H353122 Nonexudative age-related macular degeneration, left eye, intermediate dry stage: Secondary | ICD-10-CM | POA: Diagnosis not present

## 2016-08-14 DIAGNOSIS — H26492 Other secondary cataract, left eye: Secondary | ICD-10-CM | POA: Diagnosis not present

## 2016-08-14 DIAGNOSIS — H353211 Exudative age-related macular degeneration, right eye, with active choroidal neovascularization: Secondary | ICD-10-CM | POA: Diagnosis not present

## 2016-08-14 DIAGNOSIS — H353112 Nonexudative age-related macular degeneration, right eye, intermediate dry stage: Secondary | ICD-10-CM | POA: Diagnosis not present

## 2016-08-16 DIAGNOSIS — H26492 Other secondary cataract, left eye: Secondary | ICD-10-CM | POA: Diagnosis not present

## 2016-08-17 DIAGNOSIS — C670 Malignant neoplasm of trigone of bladder: Secondary | ICD-10-CM | POA: Diagnosis not present

## 2016-08-17 DIAGNOSIS — R911 Solitary pulmonary nodule: Secondary | ICD-10-CM | POA: Diagnosis not present

## 2016-08-23 DIAGNOSIS — C679 Malignant neoplasm of bladder, unspecified: Secondary | ICD-10-CM | POA: Diagnosis not present

## 2016-08-23 DIAGNOSIS — R911 Solitary pulmonary nodule: Secondary | ICD-10-CM | POA: Diagnosis not present

## 2016-09-14 DIAGNOSIS — H353122 Nonexudative age-related macular degeneration, left eye, intermediate dry stage: Secondary | ICD-10-CM | POA: Diagnosis not present

## 2016-09-14 DIAGNOSIS — H353211 Exudative age-related macular degeneration, right eye, with active choroidal neovascularization: Secondary | ICD-10-CM | POA: Diagnosis not present

## 2016-09-14 DIAGNOSIS — H26492 Other secondary cataract, left eye: Secondary | ICD-10-CM | POA: Diagnosis not present

## 2016-09-14 DIAGNOSIS — H353112 Nonexudative age-related macular degeneration, right eye, intermediate dry stage: Secondary | ICD-10-CM | POA: Diagnosis not present

## 2016-09-18 DIAGNOSIS — C670 Malignant neoplasm of trigone of bladder: Secondary | ICD-10-CM | POA: Diagnosis not present

## 2016-09-18 DIAGNOSIS — K228 Other specified diseases of esophagus: Secondary | ICD-10-CM | POA: Diagnosis not present

## 2016-09-18 DIAGNOSIS — Z932 Ileostomy status: Secondary | ICD-10-CM | POA: Diagnosis not present

## 2016-09-19 ENCOUNTER — Telehealth: Payer: Self-pay | Admitting: Gastroenterology

## 2016-09-19 NOTE — Telephone Encounter (Signed)
Referral information placed on Dr. Doyne Keel desk, I did hold appointment time on his schedule for tomorrow if needed.

## 2016-09-19 NOTE — Telephone Encounter (Signed)
Victoria Patrick if I have an opening in my schedule for clinic tomorrow please put this patient in that spot. Further, can you hold an Endo spot for her which I have open next Monday for an EGD to expedite her evaluation. Please keep records for office visit tomorrow. Thanks

## 2016-09-19 NOTE — Telephone Encounter (Signed)
Colletta [place], I have held a spot for tomorrow at 9:45 if she can make that appointment. Please give records to Jan for tomorrow's appointment, let me know if there is a problem. Thanks. Almyra Free

## 2016-09-20 ENCOUNTER — Ambulatory Visit (INDEPENDENT_AMBULATORY_CARE_PROVIDER_SITE_OTHER): Payer: Medicare Other | Admitting: Gastroenterology

## 2016-09-20 ENCOUNTER — Encounter: Payer: Self-pay | Admitting: Gastroenterology

## 2016-09-20 VITALS — BP 124/70 | HR 84 | Ht 66.0 in | Wt 141.5 lb

## 2016-09-20 DIAGNOSIS — R933 Abnormal findings on diagnostic imaging of other parts of digestive tract: Secondary | ICD-10-CM | POA: Diagnosis not present

## 2016-09-20 DIAGNOSIS — C679 Malignant neoplasm of bladder, unspecified: Secondary | ICD-10-CM | POA: Diagnosis not present

## 2016-09-20 NOTE — Patient Instructions (Signed)
You have been scheduled for an endoscopy. Please follow written instructions given to you at your visit today. If you use inhalers (even only as needed), please bring them with you on the day of your procedure. Your physician has requested that you go to www.startemmi.com and enter the access code given to you at your visit today. This web site gives a general overview about your procedure. However, you should still follow specific instructions given to you by our office regarding your preparation for the procedure.  If you are age 90 or older, your body mass index should be between 23-30. Your Body mass index is 22.84 kg/m. If this is out of the aforementioned range listed, please consider follow up with your Primary Care Provider.  If you are age 81 or younger, your body mass index should be between 19-25. Your Body mass index is 22.84 kg/m. If this is out of the aformentioned range listed, please consider follow up with your Primary Care Provider.   Thank you

## 2016-09-20 NOTE — Progress Notes (Signed)
HPI :  83 y/o female with a history of high grade bladder cancer with malignant ureteral obstruction s/p robotic cystectomy with hysterectomy, and ileal conduit urinary diversion in 06/2014, referred by Alexis Frock of Urology for abnormal CT scan.  She had a CT scan on 08/23/2016 per Dr.Manny showing 3.4cm mass like soft tissue lesion at the distal esophagus, concerning for malignancy. Small pulmonary nodule.   No issues eating. No dysphagia. No nausea or vomiting. No weight loss. No abdominal pains. No chest pains.  No shortness of breath. Good functional status. Works out in the yard. No chest pains or shortness of breath. No reflux she endorses. No FH of esophageal or gastric cancer. She is not aware of prior EGD, she can't remember. She has had a prior colonoscopy, she can't remember when. No trouble with her bowels at all.      Past Medical History:  Diagnosis Date  . Anxiety   . Boils    hx of  years ago   . Cancer ([place])    hx of skin cancer on face   . Diabetes mellitus without complication (HCC)    borderline on no meds   . Dysrhythmia    occasional skip   . Hx of bronchitis    age 39   . Hypertension   . Hypothyroidism      Past Surgical History:  Procedure Laterality Date  . CYSTOSCOPY N/A 04/30/2014   Procedure: CYSTOSCOPY;  Surgeon: Irine Seal, MD;  Location: [facility];  Service: Urology;  Laterality: N/A;  . CYSTOSCOPY WITH INJECTION N/A 06/26/2014   Procedure: CYSTOSCOPY WITH INJECTION OF INDOCYANINE GREEN DYE;  Surgeon: Alexis Frock, MD;  Location: [facility];  Service: Urology;  Laterality: N/A;  . hx of skin cancer removal     locally done   . ROBOT ASSISTED LAPAROSCOPIC COMPLETE CYSTECT ILEAL CONDUIT N/A 06/26/2014   Procedure: ROBOTIC ASSISTED LAPAROSCOPIC COMPLETE CYSTECT ILEAL CONDUIT;  Surgeon: Alexis Frock, MD;  Location: [facility];  Service: Urology;  Laterality: N/A;  . ROBOTIC ASSISTED LAPAROSCOPIC HYSTERECTOMY AND SALPINGECTOMY N/A 06/26/2014   Procedure: ROBOTIC ASSISTED LAPAROSCOPIC HYSTERECTOMY AND BILATERAL SALPINGO-OPHERECTOMY;  Surgeon: Alexis Frock, MD;  Location: [facility];  Service: Urology;  Laterality: N/A;  . TRANSURETHRAL RESECTION OF BLADDER TUMOR N/A 04/30/2014   Procedure: TRANSURETHRAL RESECTION OF BLADDER TUMOR (TURBT);  Surgeon: Irine Seal, MD;  Location: [facility];  Service: Urology;  Laterality: N/A;   Family History  Problem Relation Age of Onset  . Stroke Maternal Aunt   . Hypertension Mother   . Bladder Cancer Neg Hx   . Stomach cancer Neg Hx   . Colon cancer Neg Hx    Social History  Substance Use Topics  . Smoking status: Former Research scientist (life sciences)  . Smokeless tobacco: Never Used  . Alcohol use Yes     Comment: occasional glass of wine none since 12/2013    Current Outpatient Prescriptions  Medication Sig Dispense Refill  . losartan (COZAAR) 100 MG tablet Take 1 tablet (100 mg total) by mouth daily.    [name] SYNTHROID 88 MCG tablet Take 88 mcg by mouth every morning.      No current facility-administered medications for this visit.    Allergies  Allergen Reactions  . Diovan [Valsartan] Itching  . Codeine Nausea And Vomiting     Review of Systems: All systems reviewed and negative except where noted in HPI.    Physical Exam: BP 124/70   Pulse 84   Ht 5\' 6"  (1.676  m) Comment: w/o shoes  Wt 141 lb 8 oz (64.2 kg)   BMI 22.84 kg/m  Constitutional: Pleasant,well-developed, female in no acute distress. HEENT: Normocephalic and atraumatic. Conjunctivae are normal. No scleral icterus. Neck supple.  Cardiovascular: Normal rate, regular rhythm.  Pulmonary/chest: Effort normal and breath sounds normal. No wheezing, rales or rhonchi. Abdominal: Soft, nondistended, nontender.  There are no masses palpable. No hepatomegaly. Extremities: no edema Neurological: Alert and oriented to person place and time. Skin: Skin is warm and dry. No rashes noted. Psychiatric: Normal mood and affect. Behavior is  normal.   ASSESSMENT AND PLAN: 83 year old female with a history of bladder cancer status post therapy as outlined above, who underwent surveillance CT scan and noted to have a 3 cm mass of the distal esophagus, concerning for malignancy. She has no symptoms of dysphagia or pain at present. Given her age and comorbidities she is remarkably in good shape with excellent functional status.   I discussed the CT scan with her and potential implications of it. An EGD is recommended to further evaluate this area as soon as possible, rule out esophageal cancer. I discussed the risks and benefits of endoscopy and anesthesia with her. Following this discussion she was willing to proceed. Further recommendations pending the results. She agreed with the plan, she is currently scheduled for 9/17.  [place] Cellar, MD [facility] Pager [phone]  CC: Deland Pretty, MD

## 2016-09-25 ENCOUNTER — Ambulatory Visit (AMBULATORY_SURGERY_CENTER): Payer: Medicare Other | Admitting: Gastroenterology

## 2016-09-25 ENCOUNTER — Encounter: Payer: Self-pay | Admitting: Gastroenterology

## 2016-09-25 VITALS — BP 137/89 | HR 76 | Temp 98.0°F | Resp 25 | Ht 66.0 in | Wt 141.0 lb

## 2016-09-25 DIAGNOSIS — K2270 Barrett's esophagus without dysplasia: Secondary | ICD-10-CM | POA: Diagnosis not present

## 2016-09-25 DIAGNOSIS — K228 Other specified diseases of esophagus: Secondary | ICD-10-CM

## 2016-09-25 DIAGNOSIS — H353211 Exudative age-related macular degeneration, right eye, with active choroidal neovascularization: Secondary | ICD-10-CM | POA: Diagnosis not present

## 2016-09-25 DIAGNOSIS — R933 Abnormal findings on diagnostic imaging of other parts of digestive tract: Secondary | ICD-10-CM | POA: Diagnosis not present

## 2016-09-25 DIAGNOSIS — E039 Hypothyroidism, unspecified: Secondary | ICD-10-CM | POA: Diagnosis not present

## 2016-09-25 DIAGNOSIS — I10 Essential (primary) hypertension: Secondary | ICD-10-CM | POA: Diagnosis not present

## 2016-09-25 MED ORDER — SODIUM CHLORIDE 0.9 % IV SOLN: 500.0000 mL | INTRAVENOUS | Status: DC

## 2016-09-25 NOTE — Progress Notes (Signed)
To recovery, report to Willis, RN, VSS 

## 2016-09-25 NOTE — Op Note (Signed)
[place] Patient Name: Victoria Patrick Procedure Date: 09/25/2016 2:17 PM MRN: [id number] Endoscopist: Remo Lipps P. Armbruster MD, MD Age: 83 Referring MD:  Date of Birth: 1922-06-03 Gender: Female Account #: [id number] Procedure:                Upper GI endoscopy Indications:              history of bladder cancer, in remission, with                            abnormal CT of the GI tract showing suspected 3.5cm                            distal esophageal mass Medicines:                Monitored Anesthesia Care Procedure:                Pre-Anesthesia Assessment:                           - Prior to the procedure, a History and Physical                            was performed, and patient medications and                            allergies were reviewed. The patient's tolerance of                            previous anesthesia was also reviewed. The risks                            and benefits of the procedure and the sedation                            options and risks were discussed with the patient.                            All questions were answered, and informed consent                            was obtained. Prior Anticoagulants: The patient has                            taken no previous anticoagulant or antiplatelet                            agents. ASA Grade Assessment: III - A patient with                            severe systemic disease. After reviewing the risks                            and benefits, the patient was deemed in  satisfactory condition to undergo the procedure.                           After obtaining informed consent, the endoscope was                            passed under direct vision. Throughout the                            procedure, the patient's blood pressure, pulse, and                            oxygen saturations were monitored continuously. The                            Endoscope was introduced  through the mouth, and                            advanced to the second part of duodenum. The upper                            GI endoscopy was accomplished without difficulty.                            The patient tolerated the procedure well. Scope In: Scope Out: Findings:                 Esophagogastric landmarks were identified: the                            Z-line was found at 33 cm, the gastroesophageal                            junction was found at 33 cm and the upper extent of                            the gastric folds was found at 38 cm from the                            incisors.                           A 5 cm hiatal hernia was present.                           The Z-line was irregular and was found 33 cm from                            the incisors. Biopsies were taken with a cold                            forceps for histology.  A roughly 3cm subepithelial mass was found within                            the hernia sac in the gastric cardia. Bite on bite                            biopsies not taken (patient likely needs EUS)                           The exam of the esophagus was otherwise normal.                           The entire examined stomach was normal.                           The duodenal bulb and second portion of the                            duodenum were normal. Complications:            No immediate complications. Estimated blood loss:                            Minimal. Estimated Blood Loss:     Estimated blood loss was minimal. Impression:               - Esophagogastric landmarks identified.                           - 5 cm hiatal hernia.                           - Z-line irregular, 33 cm from the incisors.                            Biopsied.                           - Subepithelial mass was found in the cardia within                            hernia sac - ddx includes GIST, other subepithelial                             lesions to include metastatic lesion.                           - Normal stomach.                           - Normal duodenal bulb and second portion of the                            duodenum. Recommendation:           - Patient has a contact number available for  emergencies. The signs and symptoms of potential                            delayed complications were discussed with the                            patient. Return to normal activities tomorrow.                            Written discharge instructions were provided to the                            patient.                           - Resume previous diet.                           - Continue present medications.                           - Await pathology results                           - Consideration for EUS to further evaluate, I will                            discuss this case with Dr. Ardis Hughs and contact the                            patient with further recommendations. Remo Lipps P. Armbruster MD, MD 09/25/2016 2:40:24 PM This report has been signed electronically.

## 2016-09-25 NOTE — Patient Instructions (Signed)
YOU HAD AN ENDOSCOPIC PROCEDURE TODAY AT [facility]:   Refer to the procedure report that was given to you for any specific questions about what was found during the examination.  If the procedure report does not answer your questions, please call your gastroenterologist to clarify.  If you requested that your care partner not be given the details of your procedure findings, then the procedure report has been included in a sealed envelope for you to review at your convenience later.  YOU SHOULD EXPECT: Some feelings of bloating in the abdomen. Passage of more gas than usual.  Walking can help get rid of the air that was put into your GI tract during the procedure and reduce the bloating. If you had a lower endoscopy (such as a colonoscopy or flexible sigmoidoscopy) you may notice spotting of blood in your stool or on the toilet paper. If you underwent a bowel prep for your procedure, you may not have a normal bowel movement for a few days.  Please Note:  You might notice some irritation and congestion in your nose or some drainage.  This is from the oxygen used during your procedure.  There is no need for concern and it should clear up in a day or so.  SYMPTOMS TO REPORT IMMEDIATELY:    Following upper endoscopy (EGD)  Vomiting of blood or coffee ground material  New chest pain or pain under the shoulder blades  Painful or persistently difficult swallowing  New shortness of breath  Fever of 100F or higher  Black, tarry-looking stools  For urgent or emergent issues, a gastroenterologist can be reached at any hour by calling [phone].   DIET:  We do recommend a small meal at first, but then you may proceed to your regular diet.  Drink plenty of fluids but you should avoid alcoholic beverages for 24 hours.  ACTIVITY:  You should plan to take it easy for the rest of today and you should NOT DRIVE or use heavy machinery until tomorrow (because of the sedation medicines used  during the test).    FOLLOW UP: Our staff will call the number listed on your records the next business day following your procedure to check on you and address any questions or concerns that you may have regarding the information given to you following your procedure. If we do not reach you, we will leave a message.  However, if you are feeling well and you are not experiencing any problems, there is no need to return our call.  We will assume that you have returned to your regular daily activities without incident.  If any biopsies were taken you will be contacted by phone or by letter within the next 1-3 weeks.  Please call us at [phone] if you have not heard about the biopsies in 3 weeks.    SIGNATURES/CONFIDENTIALITY: You and/or your care partner have signed paperwork which will be entered into your electronic medical record.  These signatures attest to the fact that that the information above on your After Visit Summary has been reviewed and is understood.  Full responsibility of the confidentiality of this discharge information lies with you and/or your care-partner.   Handouts were given to your care partner on a hiatal hernia. You may resume your current medications today. Await biopsy results. Consider EUS to further evaluate.  This procedure will be done at the hospital.  Dr. Doyne Keel nurse will call you with an appointment. And info. Please call  if any questions or concerns.

## 2016-09-25 NOTE — Progress Notes (Signed)
Called to room to assist during endoscopic procedure.  Patient ID and intended procedure confirmed with present staff. Received instructions for my participation in the procedure from the performing physician.  

## 2016-09-25 NOTE — Progress Notes (Signed)
No problems noted in the recovery room. maw 

## 2016-09-26 ENCOUNTER — Telehealth: Payer: Self-pay

## 2016-09-26 NOTE — Telephone Encounter (Signed)
-----   Message from [name], MD sent at 09/26/2016  2:00 PM EDT ----- Regarding: RE: EUS - gastric cardia mass [name], thanks  [name], She needs upper EUS, radial +/- linear for gastric mass. This Thursday if possible.  Next week if she cannot make it this week.  Thanks  DJ ----- Message ----- From: [name], MD Sent: 09/26/2016   1:12 PM To: [name], MD Subject: EUS - gastric cardia mass                      [name], This is the patient I spoke with you about, subepithelial cardia mass, regarding EUS. I spoke with the patient about it previously and told her she would be contacted if we were going to proceed. Thanks for your help with it.  [name]

## 2016-09-26 NOTE — Telephone Encounter (Signed)
  Follow up Call-  Call back number 09/25/2016  Post procedure Call Back phone  # [phone]  Permission to leave phone message Yes  Some recent data might be hidden     Patient questions:  Do you have a fever, pain , or abdominal swelling? No. Pain Score  0 *  Have you tolerated food without any problems? Yes.    Have you been able to return to your normal activities? Yes.    Do you have any questions about your discharge instructions: Diet   No. Medications  No. Follow up visit  No.  Do you have questions or concerns about your Care? No.  Actions: * If pain score is 4 or above: No action needed, pain <4.

## 2016-09-27 ENCOUNTER — Other Ambulatory Visit: Payer: Self-pay

## 2016-09-27 ENCOUNTER — Encounter (HOSPITAL_COMMUNITY): Payer: Self-pay | Admitting: *Deleted

## 2016-09-27 DIAGNOSIS — K3189 Other diseases of stomach and duodenum: Secondary | ICD-10-CM

## 2016-09-27 NOTE — Telephone Encounter (Signed)
EUS scheduled, pt instructed and medications reviewed.  Patient instructions mailed to home.  Patient to call with any questions or concerns.  The pt has been scheduled for 9/20 at 1130 am she has been verbally instructed and verbalized all instructions

## 2016-09-28 ENCOUNTER — Encounter (HOSPITAL_COMMUNITY): Payer: Self-pay | Admitting: Certified Registered"

## 2016-09-28 ENCOUNTER — Ambulatory Visit (HOSPITAL_COMMUNITY): Payer: Medicare Other | Admitting: Certified Registered"

## 2016-09-28 ENCOUNTER — Encounter (HOSPITAL_COMMUNITY)
Admission: RE | Disposition: A | Discharge status: Home / Self Care | Payer: Self-pay | Source: Ambulatory Visit | Attending: Gastroenterology

## 2016-09-28 ENCOUNTER — Ambulatory Visit (HOSPITAL_COMMUNITY)
Admission: RE | Admit: 2016-09-28 | Discharge: 2016-09-28 | Disposition: A | Discharge status: Home / Self Care | Payer: Medicare Other | Source: Ambulatory Visit | Attending: Gastroenterology | Admitting: Gastroenterology

## 2016-09-28 DIAGNOSIS — E119 Type 2 diabetes mellitus without complications: Secondary | ICD-10-CM | POA: Diagnosis not present

## 2016-09-28 DIAGNOSIS — Z8551 Personal history of malignant neoplasm of bladder: Secondary | ICD-10-CM | POA: Diagnosis not present

## 2016-09-28 DIAGNOSIS — F419 Anxiety disorder, unspecified: Secondary | ICD-10-CM | POA: Insufficient documentation

## 2016-09-28 DIAGNOSIS — Z79899 Other long term (current) drug therapy: Secondary | ICD-10-CM | POA: Diagnosis not present

## 2016-09-28 DIAGNOSIS — K319 Disease of stomach and duodenum, unspecified: Secondary | ICD-10-CM | POA: Diagnosis not present

## 2016-09-28 DIAGNOSIS — Z87891 Personal history of nicotine dependence: Secondary | ICD-10-CM | POA: Diagnosis not present

## 2016-09-28 DIAGNOSIS — I10 Essential (primary) hypertension: Secondary | ICD-10-CM | POA: Insufficient documentation

## 2016-09-28 DIAGNOSIS — K2270 Barrett's esophagus without dysplasia: Secondary | ICD-10-CM

## 2016-09-28 DIAGNOSIS — K449 Diaphragmatic hernia without obstruction or gangrene: Secondary | ICD-10-CM | POA: Insufficient documentation

## 2016-09-28 DIAGNOSIS — K3189 Other diseases of stomach and duodenum: Secondary | ICD-10-CM

## 2016-09-28 DIAGNOSIS — E039 Hypothyroidism, unspecified: Secondary | ICD-10-CM | POA: Diagnosis not present

## 2016-09-28 DIAGNOSIS — C679 Malignant neoplasm of bladder, unspecified: Secondary | ICD-10-CM | POA: Diagnosis not present

## 2016-09-28 HISTORY — PX: EUS: SHX5427

## 2016-09-28 HISTORY — DX: Other diseases of stomach and duodenum: K31.89

## 2016-09-28 SURGERY — UPPER ENDOSCOPIC ULTRASOUND (EUS) LINEAR
Anesthesia: Monitor Anesthesia Care

## 2016-09-28 MED ORDER — LIDOCAINE 2% (20 MG/ML) 5 ML SYRINGE
INTRAMUSCULAR | Status: DC | PRN
  Administered 2016-09-28: 40 mg via INTRAVENOUS

## 2016-09-28 MED ORDER — LACTATED RINGERS IV SOLN
INTRAVENOUS | Status: DC
  Administered 2016-09-28: 1000 mL via INTRAVENOUS
  Administered 2016-09-28: 11:00:00 via INTRAVENOUS

## 2016-09-28 MED ORDER — SODIUM CHLORIDE 0.9 % IV SOLN: INTRAVENOUS | Status: DC

## 2016-09-28 MED ORDER — ONDANSETRON HCL 4 MG/2ML IJ SOLN
INTRAMUSCULAR | Status: DC | PRN
  Administered 2016-09-28: 4 mg via INTRAVENOUS

## 2016-09-28 MED ORDER — PROPOFOL 10 MG/ML IV BOLUS
INTRAVENOUS | Status: AC
  Filled 2016-09-28: qty 40

## 2016-09-28 MED ORDER — DEXAMETHASONE SODIUM PHOSPHATE 10 MG/ML IJ SOLN
INTRAMUSCULAR | Status: DC | PRN
  Administered 2016-09-28: 5 mg via INTRAVENOUS

## 2016-09-28 MED ORDER — PROPOFOL 10 MG/ML IV BOLUS
INTRAVENOUS | Status: DC | PRN
  Administered 2016-09-28 (×3): 20 mg via INTRAVENOUS
  Administered 2016-09-28: 10 mg via INTRAVENOUS
  Administered 2016-09-28 (×3): 20 mg via INTRAVENOUS
  Administered 2016-09-28: 10 mg via INTRAVENOUS
  Administered 2016-09-28 (×8): 20 mg via INTRAVENOUS

## 2016-09-28 NOTE — Interval H&P Note (Signed)
History and Physical Interval Note:  09/28/2016 10:25 AM  Victoria Patrick  has presented today for surgery, with the diagnosis of gastric mass   The various methods of treatment have been discussed with the patient and family. After consideration of risks, benefits and other options for treatment, the patient has consented to  Procedure(s): UPPER ENDOSCOPIC ULTRASOUND (EUS) LINEAR (N/A) as a surgical intervention .  The patient's history has been reviewed, patient examined, no change in status, stable for surgery.  I have reviewed the patient's chart and labs.  Questions were answered to the patient's satisfaction.     [name]

## 2016-09-28 NOTE — H&P (View-Only) (Signed)
HPI :  83 y/o female with a history of high grade bladder cancer with malignant ureteral obstruction s/p robotic cystectomy with hysterectomy, and ileal conduit urinary diversion in 06/2014, referred by Alexis Frock of Urology for abnormal CT scan.  She had a CT scan on 08/23/2016 per Dr.Manny showing 3.4cm mass like soft tissue lesion at the distal esophagus, concerning for malignancy. Small pulmonary nodule.   No issues eating. No dysphagia. No nausea or vomiting. No weight loss. No abdominal pains. No chest pains.  No shortness of breath. Good functional status. Works out in the yard. No chest pains or shortness of breath. No reflux she endorses. No FH of esophageal or gastric cancer. She is not aware of prior EGD, she can't remember. She has had a prior colonoscopy, she can't remember when. No trouble with her bowels at all.      Past Medical History:  Diagnosis Date  . Anxiety   . Boils    hx of  years ago   . Cancer ([place])    hx of skin cancer on face   . Diabetes mellitus without complication (HCC)    borderline on no meds   . Dysrhythmia    occasional skip   . Hx of bronchitis    age 11   . Hypertension   . Hypothyroidism      Past Surgical History:  Procedure Laterality Date  . CYSTOSCOPY N/A 04/30/2014   Procedure: CYSTOSCOPY;  Surgeon: Irine Seal, MD;  Location: [facility];  Service: Urology;  Laterality: N/A;  . CYSTOSCOPY WITH INJECTION N/A 06/26/2014   Procedure: CYSTOSCOPY WITH INJECTION OF INDOCYANINE GREEN DYE;  Surgeon: Alexis Frock, MD;  Location: [facility];  Service: Urology;  Laterality: N/A;  . hx of skin cancer removal     locally done   . ROBOT ASSISTED LAPAROSCOPIC COMPLETE CYSTECT ILEAL CONDUIT N/A 06/26/2014   Procedure: ROBOTIC ASSISTED LAPAROSCOPIC COMPLETE CYSTECT ILEAL CONDUIT;  Surgeon: Alexis Frock, MD;  Location: [facility];  Service: Urology;  Laterality: N/A;  . ROBOTIC ASSISTED LAPAROSCOPIC HYSTERECTOMY AND SALPINGECTOMY N/A 06/26/2014   Procedure: ROBOTIC ASSISTED LAPAROSCOPIC HYSTERECTOMY AND BILATERAL SALPINGO-OPHERECTOMY;  Surgeon: Alexis Frock, MD;  Location: [facility];  Service: Urology;  Laterality: N/A;  . TRANSURETHRAL RESECTION OF BLADDER TUMOR N/A 04/30/2014   Procedure: TRANSURETHRAL RESECTION OF BLADDER TUMOR (TURBT);  Surgeon: Irine Seal, MD;  Location: [facility];  Service: Urology;  Laterality: N/A;   Family History  Problem Relation Age of Onset  . Stroke Maternal Aunt   . Hypertension Mother   . Bladder Cancer Neg Hx   . Stomach cancer Neg Hx   . Colon cancer Neg Hx    Social History  Substance Use Topics  . Smoking status: Former Research scientist (life sciences)  . Smokeless tobacco: Never Used  . Alcohol use Yes     Comment: occasional glass of wine none since 12/2013    Current Outpatient Prescriptions  Medication Sig Dispense Refill  . losartan (COZAAR) 100 MG tablet Take 1 tablet (100 mg total) by mouth daily.    [name] SYNTHROID 88 MCG tablet Take 88 mcg by mouth every morning.      No current facility-administered medications for this visit.    Allergies  Allergen Reactions  . Diovan [Valsartan] Itching  . Codeine Nausea And Vomiting     Review of Systems: All systems reviewed and negative except where noted in HPI.    Physical Exam: BP 124/70   Pulse 84   Ht 5\' 6"  (1.676  m) Comment: w/o shoes  Wt 141 lb 8 oz (64.2 kg)   BMI 22.84 kg/m  Constitutional: Pleasant,well-developed, female in no acute distress. HEENT: Normocephalic and atraumatic. Conjunctivae are normal. No scleral icterus. Neck supple.  Cardiovascular: Normal rate, regular rhythm.  Pulmonary/chest: Effort normal and breath sounds normal. No wheezing, rales or rhonchi. Abdominal: Soft, nondistended, nontender.  There are no masses palpable. No hepatomegaly. Extremities: no edema Neurological: Alert and oriented to person place and time. Skin: Skin is warm and dry. No rashes noted. Psychiatric: Normal mood and affect. Behavior is  normal.   ASSESSMENT AND PLAN: 83 year old female with a history of bladder cancer status post therapy as outlined above, who underwent surveillance CT scan and noted to have a 3 cm mass of the distal esophagus, concerning for malignancy. She has no symptoms of dysphagia or pain at present. Given her age and comorbidities she is remarkably in good shape with excellent functional status.   I discussed the CT scan with her and potential implications of it. An EGD is recommended to further evaluate this area as soon as possible, rule out esophageal cancer. I discussed the risks and benefits of endoscopy and anesthesia with her. Following this discussion she was willing to proceed. Further recommendations pending the results. She agreed with the plan, she is currently scheduled for 9/17.  [place] Cellar, MD [facility] Pager [phone]  CC: Deland Pretty, MD

## 2016-09-28 NOTE — Anesthesia Postprocedure Evaluation (Signed)
Anesthesia Post Note  Patient: Victoria Patrick  Procedure(s) Performed: Procedure(s) (LRB): UPPER ENDOSCOPIC ULTRASOUND (EUS) LINEAR (N/A)     Patient location during evaluation: Endoscopy Anesthesia Type: MAC Level of consciousness: awake Pain management: pain level controlled Vital Signs Assessment: post-procedure vital signs reviewed and stable Respiratory status: spontaneous breathing Cardiovascular status: stable Anesthetic complications: no    Last Vitals:  Vitals:   09/28/16 1200 09/28/16 1210  BP: (!) 144/62 (!) 157/64  Pulse: 66 64  Resp: 18 17  Temp:    SpO2: 94% 100%    Last Pain:  Vitals:   09/28/16 1027  TempSrc: Oral                 Michoel Kunin

## 2016-09-28 NOTE — Anesthesia Procedure Notes (Signed)
Procedure Name: MAC Date/Time: 09/28/2016 10:45 AM Performed by: Cynda Familia Pre-anesthesia Checklist: Patient identified, Emergency Drugs available, Suction available, Patient being monitored and Timeout performed Patient Re-evaluated:Patient Re-evaluated prior to induction Placement Confirmation: positive ETCO2 and breath sounds checked- equal and bilateral Dental Injury: Teeth and Oropharynx as per pre-operative assessment  Comments: Bite block by GI tech

## 2016-09-28 NOTE — Discharge Instructions (Signed)
Esophagogastroduodenoscopy, Care After °Refer to this sheet in the next few weeks. These instructions provide you with information about caring for yourself after your procedure. Your health care provider may also give you more specific instructions. Your treatment has been planned according to current medical practices, but problems sometimes occur. Call your health care provider if you have any problems or questions after your procedure. °What can I expect after the procedure? °After the procedure, it is common to have: °· A sore throat. °· Nausea. °· Bloating. °· Dizziness. °· Fatigue. ° °Follow these instructions at home: °· Do not eat or drink anything until the numbing medicine (local anesthetic) has worn off and your gag reflex has returned. You will know that the local anesthetic has worn off when you can swallow comfortably. °· Do not drive for 24 hours if you received a medicine to help you relax (sedative). °· If your health care provider took a tissue sample for testing during the procedure, make sure to get your test results. This is your responsibility. Ask your health care provider or the department performing the test when your results will be ready. °· Keep all follow-up visits as told by your health care provider. This is important. °Contact a health care provider if: °· You cannot stop coughing. °· You are not urinating. °· You are urinating less than usual. °Get help right away if: °· You have trouble swallowing. °· You cannot eat or drink. °· You have throat or chest pain that gets worse. °· You are dizzy or light-headed. °· You faint. °· You have nausea or vomiting. °· You have chills. °· You have a fever. °· You have severe abdominal pain. °· You have black, tarry, or bloody stools. °This information is not intended to replace advice given to you by your health care provider. Make sure you discuss any questions you have with your health care provider. °Document Released: 12/13/2011 Document  Revised: 06/03/2015 Document Reviewed: 11/19/2014 °Elsevier Interactive Patient Education © 2018 [employer] ° °

## 2016-09-28 NOTE — Op Note (Signed)
[facility] Patient Name: Victoria Patrick Procedure Date: 09/28/2016 MRN: [id number] Attending MD: [name] , MD Date of Birth: Dec 19, 1922 CSN: [id number] Age: 83 Admit Type: Outpatient Procedure:                Upper EUS Indications:              Mass in stomach on CT and also by EGD recently                            (submucosal) Providers:                [name], MD, Elmer Ramp. Tilden Dome, RN, Cletis Athens, Technician Referring MD:             Jolly Mango MD Medicines:                Monitored Anesthesia Care Complications:            No immediate complications. Estimated blood loss:                            None. Estimated Blood Loss:     Estimated blood loss: none. Procedure:                Pre-Anesthesia Assessment:                           - Prior to the procedure, a History and Physical                            was performed, and patient medications and                            allergies were reviewed. The patient's tolerance of                            previous anesthesia was also reviewed. The risks                            and benefits of the procedure and the sedation                            options and risks were discussed with the patient.                            All questions were answered, and informed consent                            was obtained. Prior Anticoagulants: The patient has                            taken no previous anticoagulant or antiplatelet  agents. ASA Grade Assessment: III - A patient with                            severe systemic disease. After reviewing the risks                            and benefits, the patient was deemed in                            satisfactory condition to undergo the procedure.                           After obtaining informed consent, the endoscope was                            passed under direct vision. Throughout  the                            procedure, the patient's blood pressure, pulse, and                            oxygen saturations were monitored continuously. The                            [id number] ([id number]) scope was introduced through                            the mouth, and advanced to the body of the stomach.                            The was introduced through the mouth, and advanced                            to the body of the stomach. The upper EUS was                            accomplished without difficulty. The patient                            tolerated the procedure well. Scope In: Scope Out: Findings:      Endoscopic Finding :      1. Medium to large hiatal hernia containing a 2-3cm submucosal lesion       with normal overlying gastric mucosa.      2. Otherwise normal examination to the distal stomach.      Endosonographic Finding :      1. The submucosal lesion above correlates with an oblong, tubular 3.5cm       hypoechoic, heterogeneous mass that clearly involves with the muscularis       propria layer of the gastric wall. This was sampled with 2 passes of a       25 gauge EUS FNA needle and 1 pass of a 22 gauge EUS FNA needle using       color doppler to avoid significant blood vessels.  2. The gastric wall was otherwise normal.      3. Limited views of pancreas, spleen, liver, portal and splenic vessels       were all norma Impression:               - Tubular 3.5cm gastric submucosal lesion within a                            medium sized hiatal hernia. This communicates with                            the muscularis propria layer, preliminary cytology                            reading shows spindle shaped cells. 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 It is                            not related to her bladder cancer. Await final                            cytology reading. Moderate Sedation:      N/A- Per  Anesthesia Care Recommendation:           - Discharge patient to home (ambulatory).                           - Await cytology results. Procedure Code(s):        --- Professional ---                           [phone], 24, Esophagogastroduodenoscopy, flexible,                            transoral; with transendoscopic ultrasound-guided                            intramural or transmural fine needle                            aspiration/biopsy(s), (includes endoscopic                            ultrasound examination limited to the esophagus,                            stomach or duodenum, and adjacent structures) Diagnosis Code(s):        --- Professional ---                           K31.89, Other diseases of stomach and duodenum                           R93.3, Abnormal findings on diagnostic imaging of  other parts of digestive tract CPT copyright 2016 [employer] Medical [employer]. All rights reserved. The codes documented in this report are preliminary and upon coder review may  be revised to meet current compliance requirements. [name], MD 09/28/2016 11:45:27 AM This report has been signed electronically. Number of Addenda: 0

## 2016-09-28 NOTE — Anesthesia Preprocedure Evaluation (Addendum)
Anesthesia Evaluation  Patient identified by MRN, date of birth, ID band Patient awake    Reviewed: Allergy & Precautions, NPO status , Patient's Chart, lab work & pertinent test results  Airway Mallampati: II  TM Distance: >3 FB     Dental  (+) Dental Advisory Given, Chipped, Missing   Pulmonary former smoker,    breath sounds clear to auscultation       Cardiovascular hypertension, + dysrhythmias  Rhythm:Regular Rate:Normal     Neuro/Psych    GI/Hepatic negative GI ROS, Neg liver ROS,   Endo/Other  negative endocrine ROSHypothyroidism   Renal/GU negative Renal ROS     Musculoskeletal   Abdominal   Peds  Hematology   Anesthesia Other Findings   Reproductive/Obstetrics                           Anesthesia Physical Anesthesia Plan  ASA: III  Anesthesia Plan: MAC   Post-op Pain Management:    Induction: Intravenous  PONV Risk Score and Plan: 2 and Ondansetron and Dexamethasone  Airway Management Planned: Simple Face Mask  Additional Equipment:   Intra-op Plan:   Post-operative Plan:   Informed Consent: I have reviewed the patients History and Physical, chart, labs and discussed the procedure including the risks, benefits and alternatives for the proposed anesthesia with the patient or authorized representative who has indicated his/her understanding and acceptance.   Dental advisory given  Plan Discussed with: CRNA and Anesthesiologist  Anesthesia Plan Comments:         Anesthesia Quick Evaluation

## 2016-09-28 NOTE — Transfer of Care (Signed)
Immediate Anesthesia Transfer of Care Note  Patient: Victoria Patrick  Procedure(s) Performed: Procedure(s): UPPER ENDOSCOPIC ULTRASOUND (EUS) LINEAR (N/A)  Patient Location: PACU  Anesthesia Type:MAC  Level of Consciousness: awake, alert , oriented, patient cooperative and responds to stimulation  Airway & Oxygen Therapy: Patient Spontanous Breathing and Patient connected to nasal cannula oxygen  Post-op Assessment: Report given to RN, Post -op Vital signs reviewed and stable, Patient moving all extremities X 4 and Patient able to stick tongue midline  Post vital signs: Reviewed and stable  Last Vitals:  Vitals:   09/28/16 1027  BP: (!) 169/76  Pulse: 100  Resp: 16  Temp: 36.7 C  SpO2: 97%    Last Pain:  Vitals:   09/28/16 1027  TempSrc: Oral         Complications: No apparent anesthesia complications

## 2016-09-29 ENCOUNTER — Encounter (HOSPITAL_COMMUNITY): Payer: Self-pay | Admitting: Gastroenterology

## 2016-10-03 DIAGNOSIS — Z23 Encounter for immunization: Secondary | ICD-10-CM | POA: Diagnosis not present

## 2016-10-04 ENCOUNTER — Telehealth: Payer: Self-pay | Admitting: Gastroenterology

## 2016-10-04 NOTE — Telephone Encounter (Signed)
Pt aware path is back but has not been reviewed.  We will call as soon as available.

## 2016-10-04 NOTE — Telephone Encounter (Signed)
Patient sister-in-law states pt does not remember getting a call about biopsy results if she got one. Pt sister-in-law requesting a call about results so they can be remembered.

## 2016-10-05 DIAGNOSIS — H353211 Exudative age-related macular degeneration, right eye, with active choroidal neovascularization: Secondary | ICD-10-CM | POA: Diagnosis not present

## 2016-10-05 DIAGNOSIS — H26491 Other secondary cataract, right eye: Secondary | ICD-10-CM | POA: Diagnosis not present

## 2016-10-05 DIAGNOSIS — Z961 Presence of intraocular lens: Secondary | ICD-10-CM | POA: Diagnosis not present

## 2016-11-06 DIAGNOSIS — H353122 Nonexudative age-related macular degeneration, left eye, intermediate dry stage: Secondary | ICD-10-CM | POA: Diagnosis not present

## 2016-11-06 DIAGNOSIS — H353211 Exudative age-related macular degeneration, right eye, with active choroidal neovascularization: Secondary | ICD-10-CM | POA: Diagnosis not present

## 2016-11-06 DIAGNOSIS — H353112 Nonexudative age-related macular degeneration, right eye, intermediate dry stage: Secondary | ICD-10-CM | POA: Diagnosis not present

## 2016-12-14 ENCOUNTER — Encounter: Payer: Self-pay | Admitting: Gastroenterology

## 2016-12-14 ENCOUNTER — Ambulatory Visit (INDEPENDENT_AMBULATORY_CARE_PROVIDER_SITE_OTHER): Payer: Medicare Other | Admitting: Gastroenterology

## 2016-12-14 VITALS — BP 138/72 | HR 72 | Ht 67.0 in | Wt 143.8 lb

## 2016-12-14 DIAGNOSIS — C49A0 Gastrointestinal stromal tumor, unspecified site: Secondary | ICD-10-CM | POA: Diagnosis not present

## 2016-12-14 DIAGNOSIS — K2270 Barrett's esophagus without dysplasia: Secondary | ICD-10-CM

## 2016-12-14 NOTE — Patient Instructions (Signed)
If you are age 81 or older, your body mass index should be between 23-30. Your Body mass index is 22.52 kg/m. If this is out of the aforementioned range listed, please consider follow up with your Primary Care Provider.  If you are age 9 or younger, your body mass index should be between 19-25. Your Body mass index is 22.52 kg/m. If this is out of the aformentioned range listed, please consider follow up with your Primary Care Provider.   Thank you for entrusting me with your care, Dr. Antelope Cellar

## 2016-12-14 NOTE — Progress Notes (Signed)
HPI :  83 y/o female with a history of high grade bladder cancer with malignant ureteral obstruction s/p robotic cystectomy with hysterectomy, and ileal conduit urinary diversion in 06/2014, referred initially for abnormal CT scan of the chest / abdomen, showing suspected lesion abutting the stomach / distal esophagus.   Since the last visit she had the following studies: EGD 09/25/2016 - 5cm hiatal hernia, irregular zline biopsies taken c/w short segment nondysplastic BE, 3cm subepithelial mass in cardia EUS 09/28/2016 - 2-3cm submucosal gastric mass, FNA done - c/w GIST   She states she is feeling well since her last visit. She denies any chest pain or shortness of breath. She is very active. She is eating well and denies any dysphagia. She denies any abdominal pain. She states her weight are stable. She denies any heartburn at all. She states she wishes to be on the least amount of medications is possible and avoid any invasive interventions.    Past Medical History:  Diagnosis Date  . Barrett's esophagus   . Bladder cancer ([place]) 04/30/2014  . Dysrhythmia    occasional skip   . Gastric mass   . 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 Hypertension   . Hypothyroidism   . Skin cancer    ON FACE     Past Surgical History:  Procedure Laterality Date  . CATARACT EXTRACTION, BILATERAL    . COLONOSCOPY    . CYSTOSCOPY N/A 04/30/2014   Procedure: CYSTOSCOPY;  Surgeon: Irine Seal, MD;  Location: [facility];  Service: Urology;  Laterality: N/A;  . CYSTOSCOPY WITH INJECTION N/A 06/26/2014   Procedure: CYSTOSCOPY WITH INJECTION OF INDOCYANINE GREEN DYE;  Surgeon: Alexis Frock, MD;  Location: [facility];  Service: Urology;  Laterality: N/A;  . EUS N/A 09/28/2016   Procedure: UPPER ENDOSCOPIC ULTRASOUND (EUS) LINEAR;  Surgeon: [name], MD;  Location: [facility];  Service: Endoscopy;  Laterality: N/A;  . hx of skin cancer removal     locally done   . ROBOT  ASSISTED LAPAROSCOPIC COMPLETE CYSTECT ILEAL CONDUIT N/A 06/26/2014   Procedure: ROBOTIC ASSISTED LAPAROSCOPIC COMPLETE CYSTECT ILEAL CONDUIT;  Surgeon: Alexis Frock, MD;  Location: [facility];  Service: Urology;  Laterality: N/A;  . ROBOTIC ASSISTED LAPAROSCOPIC HYSTERECTOMY AND SALPINGECTOMY N/A 06/26/2014   Procedure: ROBOTIC ASSISTED LAPAROSCOPIC HYSTERECTOMY AND BILATERAL SALPINGO-OPHERECTOMY;  Surgeon: Alexis Frock, MD;  Location: [facility];  Service: Urology;  Laterality: N/A;  . TRANSURETHRAL RESECTION OF BLADDER TUMOR N/A 04/30/2014   Procedure: TRANSURETHRAL RESECTION OF BLADDER TUMOR (TURBT);  Surgeon: Irine Seal, MD;  Location: [facility];  Service: Urology;  Laterality: N/A;  . UPPER GASTROINTESTINAL ENDOSCOPY     Family History  Problem Relation Age of Onset  . Stroke Maternal Aunt   . Hypertension Mother   . Bladder Cancer Neg Hx   . Stomach cancer Neg Hx   . Colon cancer Neg Hx   . Esophageal cancer Neg Hx   . Rectal cancer Neg Hx    Social History   Tobacco Use  . Smoking status: Former Smoker    Packs/day: 0.50    Types: Cigarettes  . Smokeless tobacco: Never Used  . Tobacco comment: quit 30 yrs ago  Substance Use Topics  . Alcohol use: Yes    Comment: no wine lately  . Drug use: No   Current Outpatient Medications  Medication Sig Dispense Refill  . losartan (COZAAR) 100 MG tablet Take 1 tablet (100 mg total) by mouth  daily.    [name] SYNTHROID 88 MCG tablet Take 88 mcg by mouth every morning.      No current facility-administered medications for this visit.    Allergies  Allergen Reactions  . Diovan [Valsartan] Itching  . Codeine Nausea And Vomiting     Review of Systems: All systems reviewed and negative except where noted in HPI.    Physical Exam: BP 138/72   Pulse 72   Ht 5\' 7"  (1.702 m)   Wt 143 lb 12.8 oz (65.2 kg)   BMI 22.52 kg/m  Constitutional: Pleasant,female in no acute distress. HEENT: Normocephalic and atraumatic. Conjunctivae are normal. No  scleral icterus. Neck supple.  Cardiovascular: Normal rate, regular rhythm.  Pulmonary/chest: Effort normal and breath sounds normal. No wheezing, rales or rhonchi. Abdominal: Soft, nondistended, nontender.There are no masses palpable. No hepatomegaly. Extremities: no edema Lymphadenopathy: No cervical adenopathy noted. Neurological: Alert and oriented to person place and time. Skin: Skin is warm and dry. No rashes noted. Psychiatric: Normal mood and affect. Behavior is normal.   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 She has no symptoms from this lesion at present time. We discussed what a GIST is, potential risk for interval enlargement and spread, with potential for future symptoms. However given her age, while she is very functional, she wishes to avoid any invasive therapies such as surgery which I think is very reasonable. Hopefully this is a slow growing lesion which does not bother her in her lifetime. If she develops symptoms of dysphagia, abdominal pain, weight loss, I asked her to contact us. No plan for surveillance imaging given she declines any treatment for this issue.  Barrett's esophagus - very short segment of Barrett's esophagus without dysplasia noted incidentally on EGD. We discussed that it is normally recommended to go on proton pump inhibitor to reduce the risk for malignant transformation. She denies any reflux or heartburn and wishes to be on least amount of medicines is possible. She declines PPI which is reasonable, this is a very low risk lesion. If she wants treatment for heartburn moving forward she can let me know.   Gueydan Cellar, MD [facility] Pager [phone]

## 2016-12-26 DIAGNOSIS — H353112 Nonexudative age-related macular degeneration, right eye, intermediate dry stage: Secondary | ICD-10-CM | POA: Diagnosis not present

## 2016-12-26 DIAGNOSIS — H353122 Nonexudative age-related macular degeneration, left eye, intermediate dry stage: Secondary | ICD-10-CM | POA: Diagnosis not present

## 2016-12-26 DIAGNOSIS — H353211 Exudative age-related macular degeneration, right eye, with active choroidal neovascularization: Secondary | ICD-10-CM | POA: Diagnosis not present

## 2016-12-26 DIAGNOSIS — H353212 Exudative age-related macular degeneration, right eye, with inactive choroidal neovascularization: Secondary | ICD-10-CM | POA: Diagnosis not present

## 2017-02-13 DIAGNOSIS — H353122 Nonexudative age-related macular degeneration, left eye, intermediate dry stage: Secondary | ICD-10-CM | POA: Diagnosis not present

## 2017-02-13 DIAGNOSIS — H353112 Nonexudative age-related macular degeneration, right eye, intermediate dry stage: Secondary | ICD-10-CM | POA: Diagnosis not present

## 2017-02-13 DIAGNOSIS — H353212 Exudative age-related macular degeneration, right eye, with inactive choroidal neovascularization: Secondary | ICD-10-CM | POA: Diagnosis not present

## 2017-02-13 DIAGNOSIS — H26492 Other secondary cataract, left eye: Secondary | ICD-10-CM | POA: Diagnosis not present

## 2017-04-25 DIAGNOSIS — I10 Essential (primary) hypertension: Secondary | ICD-10-CM | POA: Diagnosis not present

## 2017-04-25 DIAGNOSIS — E039 Hypothyroidism, unspecified: Secondary | ICD-10-CM | POA: Diagnosis not present

## 2017-04-25 DIAGNOSIS — N390 Urinary tract infection, site not specified: Secondary | ICD-10-CM | POA: Diagnosis not present

## 2017-05-02 DIAGNOSIS — C4490 Unspecified malignant neoplasm of skin, unspecified: Secondary | ICD-10-CM | POA: Diagnosis not present

## 2017-05-02 DIAGNOSIS — I10 Essential (primary) hypertension: Secondary | ICD-10-CM | POA: Diagnosis not present

## 2017-05-02 DIAGNOSIS — C670 Malignant neoplasm of trigone of bladder: Secondary | ICD-10-CM | POA: Diagnosis not present

## 2017-05-02 DIAGNOSIS — R413 Other amnesia: Secondary | ICD-10-CM | POA: Diagnosis not present

## 2017-05-02 DIAGNOSIS — M8580 Other specified disorders of bone density and structure, unspecified site: Secondary | ICD-10-CM | POA: Diagnosis not present

## 2017-05-02 DIAGNOSIS — Z66 Do not resuscitate: Secondary | ICD-10-CM | POA: Diagnosis not present

## 2017-05-02 DIAGNOSIS — R9431 Abnormal electrocardiogram [ECG] [EKG]: Secondary | ICD-10-CM | POA: Diagnosis not present

## 2017-05-02 DIAGNOSIS — Z0000 Encounter for general adult medical examination without abnormal findings: Secondary | ICD-10-CM | POA: Diagnosis not present

## 2017-05-02 DIAGNOSIS — K649 Unspecified hemorrhoids: Secondary | ICD-10-CM | POA: Diagnosis not present

## 2017-05-02 DIAGNOSIS — N183 Chronic kidney disease, stage 3 (moderate): Secondary | ICD-10-CM | POA: Diagnosis not present

## 2017-05-02 DIAGNOSIS — Z932 Ileostomy status: Secondary | ICD-10-CM | POA: Diagnosis not present

## 2017-05-02 DIAGNOSIS — E039 Hypothyroidism, unspecified: Secondary | ICD-10-CM | POA: Diagnosis not present

## 2017-06-07 DIAGNOSIS — E039 Hypothyroidism, unspecified: Secondary | ICD-10-CM | POA: Diagnosis not present

## 2017-06-12 DIAGNOSIS — H353122 Nonexudative age-related macular degeneration, left eye, intermediate dry stage: Secondary | ICD-10-CM | POA: Diagnosis not present

## 2017-06-12 DIAGNOSIS — H353212 Exudative age-related macular degeneration, right eye, with inactive choroidal neovascularization: Secondary | ICD-10-CM | POA: Diagnosis not present

## 2017-06-12 DIAGNOSIS — H353211 Exudative age-related macular degeneration, right eye, with active choroidal neovascularization: Secondary | ICD-10-CM | POA: Diagnosis not present

## 2017-06-12 DIAGNOSIS — H353112 Nonexudative age-related macular degeneration, right eye, intermediate dry stage: Secondary | ICD-10-CM | POA: Diagnosis not present

## 2017-06-25 DIAGNOSIS — R413 Other amnesia: Secondary | ICD-10-CM | POA: Diagnosis not present

## 2017-08-05 ENCOUNTER — Encounter (HOSPITAL_COMMUNITY): Payer: Self-pay | Admitting: Emergency Medicine

## 2017-08-05 ENCOUNTER — Emergency Department (HOSPITAL_COMMUNITY): Payer: Medicare Other

## 2017-08-05 ENCOUNTER — Ambulatory Visit (HOSPITAL_COMMUNITY)
Admission: EM | Admit: 2017-08-05 | Discharge: 2017-08-05 | Disposition: A | Discharge status: Home / Self Care | Payer: Medicare Other | Source: Home / Self Care

## 2017-08-05 ENCOUNTER — Emergency Department (HOSPITAL_COMMUNITY)
Admission: EM | Admit: 2017-08-05 | Discharge: 2017-08-05 | Disposition: A | Discharge status: Home / Self Care | Payer: Medicare Other | Attending: Emergency Medicine | Admitting: Emergency Medicine

## 2017-08-05 ENCOUNTER — Other Ambulatory Visit: Payer: Self-pay

## 2017-08-05 DIAGNOSIS — E039 Hypothyroidism, unspecified: Secondary | ICD-10-CM | POA: Diagnosis not present

## 2017-08-05 DIAGNOSIS — F0390 Unspecified dementia without behavioral disturbance: Secondary | ICD-10-CM | POA: Insufficient documentation

## 2017-08-05 DIAGNOSIS — Z79899 Other long term (current) drug therapy: Secondary | ICD-10-CM | POA: Diagnosis not present

## 2017-08-05 DIAGNOSIS — Z87891 Personal history of nicotine dependence: Secondary | ICD-10-CM | POA: Diagnosis not present

## 2017-08-05 DIAGNOSIS — C679 Malignant neoplasm of bladder, unspecified: Secondary | ICD-10-CM | POA: Insufficient documentation

## 2017-08-05 DIAGNOSIS — I10 Essential (primary) hypertension: Secondary | ICD-10-CM | POA: Diagnosis not present

## 2017-08-05 DIAGNOSIS — N1330 Unspecified hydronephrosis: Secondary | ICD-10-CM | POA: Diagnosis not present

## 2017-08-05 DIAGNOSIS — N390 Urinary tract infection, site not specified: Secondary | ICD-10-CM | POA: Insufficient documentation

## 2017-08-05 DIAGNOSIS — N132 Hydronephrosis with renal and ureteral calculous obstruction: Secondary | ICD-10-CM | POA: Diagnosis not present

## 2017-08-05 DIAGNOSIS — R1084 Generalized abdominal pain: Secondary | ICD-10-CM | POA: Diagnosis present

## 2017-08-05 LAB — COMPREHENSIVE METABOLIC PANEL
ALT: 12 U/L (ref 0–44)
AST: 18 U/L (ref 15–41)
Albumin: 4 g/dL (ref 3.5–5.0)
Alkaline Phosphatase: 56 U/L (ref 38–126)
Anion gap: 11 (ref 5–15)
BUN: 28 mg/dL — ABNORMAL HIGH (ref 8–23)
CHLORIDE: 101 mmol/L (ref 98–111)
CO2: 23 mmol/L (ref 22–32)
CREATININE: 1.18 mg/dL — AB (ref 0.44–1.00)
Calcium: 9.5 mg/dL (ref 8.9–10.3)
GFR, EST AFRICAN AMERICAN: 45 mL/min — AB (ref >60)
GFR, EST NON AFRICAN AMERICAN: 39 mL/min — AB (ref >60)
Glucose, Bld: 140 mg/dL — ABNORMAL HIGH (ref 70–99)
POTASSIUM: 4.2 mmol/L (ref 3.5–5.1)
Sodium: 135 mmol/L (ref 135–145)
Total Bilirubin: 0.6 mg/dL (ref 0.3–1.2)
Total Protein: 7.3 g/dL (ref 6.5–8.1)

## 2017-08-05 LAB — URINALYSIS, ROUTINE W REFLEX MICROSCOPIC
BILIRUBIN URINE: NEGATIVE
GLUCOSE, UA: NEGATIVE mg/dL
Ketones, ur: 5 mg/dL — AB
NITRITE: NEGATIVE
Protein, ur: 100 mg/dL — AB
SPECIFIC GRAVITY, URINE: 1.014 (ref 1.005–1.030)
TRANS EPITHEL UA: 2
WBC, UA: >50 WBC/hpf — ABNORMAL HIGH (ref 0–5)
pH: 8 (ref 5.0–8.0)

## 2017-08-05 LAB — CBC
HEMATOCRIT: 41.5 % (ref 36.0–46.0)
Hemoglobin: 13.2 g/dL (ref 12.0–15.0)
MCH: 29.8 pg (ref 26.0–34.0)
MCHC: 31.8 g/dL (ref 30.0–36.0)
MCV: 93.7 fL (ref 78.0–100.0)
PLATELETS: 50 10*3/uL — AB (ref 150–400)
RBC: 4.43 MIL/uL (ref 3.87–5.11)
RDW: 12 % (ref 11.5–15.5)
WBC: 13.1 10*3/uL — AB (ref 4.0–10.5)

## 2017-08-05 LAB — LIPASE, BLOOD: LIPASE: 39 U/L (ref 11–51)

## 2017-08-05 MED ORDER — CEPHALEXIN 500 MG PO CAPS: 500.0000 mg | ORAL_CAPSULE | Freq: Four times a day (QID) | ORAL | 0 refills | Status: DC

## 2017-08-05 MED ORDER — SODIUM CHLORIDE 0.9 % IV SOLN
1.0000 g | Freq: Once | INTRAVENOUS | Status: AC
  Administered 2017-08-05: 1 g via INTRAVENOUS
  Filled 2017-08-05: qty 10

## 2017-08-05 MED ORDER — LACTATED RINGERS IV BOLUS
1000.0000 mL | Freq: Once | INTRAVENOUS | Status: AC
  Administered 2017-08-05: 1000 mL via INTRAVENOUS

## 2017-08-05 NOTE — ED Notes (Signed)
Patient transported to CT 

## 2017-08-05 NOTE — ED Triage Notes (Signed)
Pt presents with complaints of abdominal pain x 2 days. Tender to palpation. Pt referred to ER for further evaluation.

## 2017-08-05 NOTE — ED Triage Notes (Signed)
Pt reports abdominal pain X2 days, describes starting in the flank area however is now described more as abdominal pain.  Pt was nauseas but has resolved, no other symptoms.  Pt very vague about symptoms, poor historian.

## 2017-08-05 NOTE — ED Notes (Signed)
EDP at bedside  

## 2017-08-05 NOTE — ED Provider Notes (Signed)
Emergency Department Provider Note   I have reviewed the triage vital signs and the nursing notes.   HISTORY  Chief Complaint Abdominal Pain   HPI Victoria Patrick is a 84 y.o. female who has a history of dementia but is here with a friend who states that she called her because she was having flank pain.  Patient went to pick her up and on the way here the pain became more suprapubic in nature around her ileal conduit.  Prior to my evaluation the pain is dissipated.  She did have some nausea associated with it but no other symptoms.  No recent illnesses.  No diarrhea or constipation.  No rash.  No history of kidney stones that she knows of. No other associated or modifying symptoms.    Past Medical History:  Diagnosis Date  . Barrett's esophagus   . Bladder cancer ([place]) 04/30/2014  . Dysrhythmia    occasional skip   . Gastric mass   . 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 Hypertension   . Hypothyroidism   . Skin cancer    ON FACE    Patient Active Problem List   Diagnosis Date Noted  . Gastric mass   . Palliative care by specialist 09/02/2014  . DNR (do not resuscitate) discussion 09/02/2014  . Delirium 08/31/2014  . Weakness generalized 08/31/2014  . Hypercalcemia 08/31/2014  . UTI (urinary tract infection) 08/31/2014  . Essential hypertension 08/31/2014  . Hypothyroidism 08/31/2014  . Weakness 08/31/2014  . Bladder cancer ([place]) 04/30/2014    Past Surgical History:  Procedure Laterality Date  . CATARACT EXTRACTION, BILATERAL    . COLONOSCOPY    . CYSTOSCOPY N/A 04/30/2014   Procedure: CYSTOSCOPY;  Surgeon: Irine Seal, MD;  [facility]: [facility];  Service: Urology;  Laterality: N/A;  . CYSTOSCOPY WITH INJECTION N/A 06/26/2014   Procedure: CYSTOSCOPY WITH INJECTION OF INDOCYANINE GREEN DYE;  Surgeon: Alexis Frock, MD;  [facility];  Service: Urology;  Laterality: N/A;  . EUS N/A 09/28/2016   Procedure: UPPER ENDOSCOPIC ULTRASOUND (EUS)  LINEAR;  Surgeon: [name], MD;  [facility];  Service: Endoscopy;  Laterality: N/A;  . hx of skin cancer removal     locally done   . ROBOT ASSISTED LAPAROSCOPIC COMPLETE CYSTECT ILEAL CONDUIT N/A 06/26/2014   Procedure: ROBOTIC ASSISTED LAPAROSCOPIC COMPLETE CYSTECT ILEAL CONDUIT;  Surgeon: Alexis Frock, MD;  [facility];  Service: Urology;  Laterality: N/A;  . ROBOTIC ASSISTED LAPAROSCOPIC HYSTERECTOMY AND SALPINGECTOMY N/A 06/26/2014   Procedure: ROBOTIC ASSISTED LAPAROSCOPIC HYSTERECTOMY AND BILATERAL SALPINGO-OPHERECTOMY;  Surgeon: Alexis Frock, MD;  [facility];  Service: Urology;  Laterality: N/A;  . TRANSURETHRAL RESECTION OF BLADDER TUMOR N/A 04/30/2014   Procedure: TRANSURETHRAL RESECTION OF BLADDER TUMOR (TURBT);  Surgeon: Irine Seal, MD;  [facility];  Service: Urology;  Laterality: N/A;  . UPPER GASTROINTESTINAL ENDOSCOPY      Current Outpatient Rx  . Order #: [id number] Class: Print  . Order #: [id number] Class: No Print  . Order #: [id number] Class: Historical Med    Allergies Diovan [valsartan] and Codeine  Family History  Problem Relation Age of Onset  . Stroke Maternal Aunt   . Hypertension Mother   . Bladder Cancer Neg Hx   . Stomach cancer Neg Hx   . Colon cancer Neg Hx   . Esophageal cancer Neg Hx   . Rectal cancer Neg Hx     Social History Social History   Tobacco  Use  . Smoking status: Former Smoker    Packs/day: 0.50    Types: Cigarettes  . Smokeless tobacco: Never Used  . Tobacco comment: quit 30 yrs ago  Substance Use Topics  . Alcohol use: Yes    Comment: no wine lately  . Drug use: No    Review of Systems  All other systems negative except as documented in the HPI. All pertinent positives and negatives as reviewed in the HPI. ____________________________________________   PHYSICAL EXAM:  VITAL SIGNS: ED Triage Vitals  Enc Vitals Group     BP 08/05/17 1717 139/86     Pulse Rate 08/05/17 1717 92      Resp 08/05/17 1717 18     Temp 08/05/17 1717 98.2 F (36.8 C)     Temp Source 08/05/17 1717 Oral     SpO2 08/05/17 1717 100 %    Constitutional: Alert and oriented. Well appearing and in no acute distress. Eyes: Conjunctivae are normal. PERRL. EOMI. Head: Atraumatic. Nose: No congestion/rhinnorhea. Mouth/Throat: Mucous membranes are moist.  Oropharynx non-erythematous. Neck: No stridor.  No meningeal signs.   Cardiovascular: Normal rate, regular rhythm. Good peripheral circulation. Grossly normal heart sounds.   Respiratory: Normal respiratory effort.  No retractions. Lungs CTAB. Gastrointestinal: Soft and nontender. No distention.  Musculoskeletal: No lower extremity tenderness nor edema. No gross deformities of extremities. Neurologic:  Normal speech and language. No gross focal neurologic deficits are appreciated.  Skin:  Skin is warm, dry and intact. No rash noted.   ____________________________________________   LABS (all labs ordered are listed, but only abnormal results are displayed)  Labs Reviewed  COMPREHENSIVE METABOLIC PANEL - Abnormal; Notable for the following components:      Result Value   Glucose, Bld 140 (*)    BUN 28 (*)    Creatinine, Ser 1.18 (*)    GFR calc non Af Amer 39 (*)    GFR calc Af Amer 45 (*)    All other components within normal limits  CBC - Abnormal; Notable for the following components:   WBC 13.1 (*)    Platelets 50 (*)    All other components within normal limits  URINALYSIS, ROUTINE W REFLEX MICROSCOPIC - Abnormal; Notable for the following components:   Color, Urine AMBER (*)    APPearance TURBID (*)    Hgb urine dipstick SMALL (*)    Ketones, ur 5 (*)    Protein, ur 100 (*)    Leukocytes, UA MODERATE (*)    WBC, UA >50 (*)    Bacteria, UA RARE (*)    All other components within normal limits  LIPASE, BLOOD   ____________________________________________   RADIOLOGY  Ct Renal Stone Study  Result Date: 08/05/2017 CLINICAL  DATA:  84 year old with flank and abdominal pain. Stone disease suspected. Nausea. EXAM: CT ABDOMEN AND PELVIS WITHOUT CONTRAST TECHNIQUE: Multidetector CT imaging of the abdomen and pelvis was performed following the standard protocol without IV contrast. COMPARISON:  Contrast-enhanced CT 08/11/2016 FINDINGS: Lower chest: Mild cardiomegaly with coronary artery calcifications. Small to moderate hiatal hernia which is increased in size from prior CT. No pleural fluid or consolidation. Hepatobiliary: Scattered hepatic granulomas. No focal lesion on noncontrast exam. Gallbladder physiologically distended, no calcified stone. No biliary dilatation. Pancreas: No ductal dilatation or inflammation. Spleen: Faint capsular calcifications anteriorly, unchanged. Normal in size. Adrenals/Urinary Tract: Normal right adrenal gland. Unchanged low-density 16 mm left adrenal nodule. Patient is post cystectomy with ileal conduit. There is left hydroureteronephrosis with diffuse ureteral dilatation,  transition at the ileal conduit anastomosis. No urolithiasis. Moderate left perinephric edema. Punctate parenchymal calcification in the left kidney is unchanged. Small exophytic lesion from the posterolateral left kidney has slightly decreased attenuation from prior exam. Additional smaller lesions are not as well characterized currently given lack contrast, better grossly similar. Minimal right hydronephrosis and prominence of the ureter to a lesser extent. Minimal right perinephric edema appears similar to prior exam. The ileal conduit is physiologically distended. Stomach/Bowel: Increased size of small to moderate hiatal hernia. Stomach is otherwise decompressed. No small bowel inflammation or obstruction. Fecalization of small bowel contents at the ileal anastomosis without proximal dilatation. Colonic diverticulosis most prominent in the sigmoid without diverticulitis. Appendix not definitively visualized, no secondary findings of  appendicitis. Vascular/Lymphatic: Advanced aortic and branch atherosclerosis. No aneurysm. Lack contrast limits assessment for adenopathy, allowing for this, no bulky adenopathy. Reproductive: Status post hysterectomy. No adnexal masses. Other: Umbilical hernia contains fat and anti mesenteric border of transverse colon, no colonic wall thickening or obstruction. No ascites or free air. No intra-abdominal abscess. Musculoskeletal: There are no acute or suspicious osseous abnormalities. Degenerative change in the spine with Modic endplate changes at M6-Q9. IMPRESSION: 1. Post cystectomy with ileal conduit. Moderate left hydroureteronephrosis with transition at the ureteral conduit anastomosis. Considerations include stricture or urothelial lesion. No obstructing stone. Minimal right hydronephrosis and ureteral prominence as well. 2. Small to moderate hiatal hernia which has increased in size from exam 1 year prior. 3. Chronic findings of colonic diverticulosis, small hyperattenuating renal lesions likely complex cysts, and Aortic Atherosclerosis (ICD10-I70.0). Electronically Signed   By: Jeb Levering M.D.   On: 08/05/2017 21:19    ____________________________________________   INITIAL IMPRESSION / ASSESSMENT AND PLAN / ED COURSE  Diverticulitis vs kidney stone vs uti. No rash to suggest zoster.   Does suspect cellulitis however with hydronephrosis on the left and possible stricture she will need to follow-up with urology.     Pertinent labs & imaging results that were available during my care of the patient were reviewed by me and considered in my medical decision making (see chart for details).  ____________________________________________  FINAL CLINICAL IMPRESSION(S) / ED DIAGNOSES  Final diagnoses:  Urinary tract infection without hematuria, site unspecified  Hydronephrosis, unspecified hydronephrosis type     MEDICATIONS GIVEN DURING THIS VISIT:  Medications  cefTRIAXone  (ROCEPHIN) 1 g in sodium chloride 0.9 % 100 mL IVPB (0 g Intravenous Stopped 08/05/17 2055)  lactated ringers bolus 1,000 mL (0 mLs Intravenous Stopped 08/05/17 2148)     NEW OUTPATIENT MEDICATIONS STARTED DURING THIS VISIT:  Discharge Medication List as of 08/05/2017  9:32 PM      Note:  This note was prepared with assistance of Dragon voice recognition software. Occasional wrong-word or sound-a-like substitutions may have occurred due to the inherent limitations of voice recognition software.   Kelin Borum, Corene Cornea, MD 08/06/17 [phone]

## 2017-08-07 DIAGNOSIS — R8271 Bacteriuria: Secondary | ICD-10-CM | POA: Diagnosis not present

## 2017-08-07 DIAGNOSIS — C670 Malignant neoplasm of trigone of bladder: Secondary | ICD-10-CM | POA: Diagnosis not present

## 2017-08-07 DIAGNOSIS — N130 Hydronephrosis with ureteropelvic junction obstruction: Secondary | ICD-10-CM | POA: Diagnosis not present

## 2017-08-08 ENCOUNTER — Other Ambulatory Visit: Payer: Self-pay | Admitting: Urology

## 2017-08-09 ENCOUNTER — Other Ambulatory Visit: Payer: Self-pay | Admitting: Urology

## 2017-08-09 DIAGNOSIS — N1330 Unspecified hydronephrosis: Secondary | ICD-10-CM

## 2017-08-10 ENCOUNTER — Other Ambulatory Visit: Payer: Self-pay

## 2017-08-30 ENCOUNTER — Encounter: Payer: Self-pay | Admitting: Neurology

## 2017-08-30 ENCOUNTER — Ambulatory Visit (INDEPENDENT_AMBULATORY_CARE_PROVIDER_SITE_OTHER): Payer: Medicare Other | Admitting: Neurology

## 2017-08-30 DIAGNOSIS — R413 Other amnesia: Secondary | ICD-10-CM | POA: Diagnosis not present

## 2017-08-30 HISTORY — DX: Other amnesia: R41.3
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
## 2017-08-31 NOTE — Patient Instructions (Addendum)
Victoria Patrick  08/31/2017   Your procedure is scheduled on: 09-07-17   Report to [facility] Main  Entrance    Report to Admitting at 12:15 PM    Call this number if you have problems the morning of surgery [phone]   Remember: Do not eat food or drink liquids :After Midnight. You may have a Clear Liquid Diet from Midnight until 8:15 AM. After 8:15 AM, nothing until after surgery.     CLEAR LIQUID DIET   Foods Allowed                                                                     Foods Excluded  Coffee and tea, regular and decaf                             liquids that you cannot  Plain Jell-O in any flavor                                             see through such as: Fruit ices (not with fruit pulp)                                     milk, soups, orange juice  Iced Popsicles                                    All solid food Carbonated beverages, regular and diet                                    Cranberry, grape and apple juices Sports drinks like Gatorade Lightly seasoned clear broth or consume(fat free) Sugar, honey syrup  Sample Menu Breakfast                                Lunch                                     Supper Cranberry juice                    Beef broth                            Chicken broth Jell-O                                     Grape juice  Apple juice Coffee or tea                        Jell-O                                      Popsicle                                                Coffee or tea                        Coffee or tea  _____________________________________________________________________     Take these medicines the morning of surgery with A SIP OF WATER: Synthroid                                You may not have any metal on your body including hair pins and              piercings  Do not wear jewelry, make-up, lotions, powders or perfumes, deodorant             Do  not wear nail polish.  Do not shave  48 hours prior to surgery.                Do not bring valuables to the hospital. [place].  Contacts, dentures or bridgework may not be worn into surgery.  Leave suitcase in the car. After surgery it may be brought to your room.       Special Instructions: Per your surgeon's instructions, please have a Clear Liquid Diet along with your prep.              Please read over the following fact sheets you were given: _____________________________________________________________________             [facility] - Preparing for Surgery Before surgery, you can play an important role.  Because skin is not sterile, your skin needs to be as free of germs as possible.  You can reduce the number of germs on your skin by washing with CHG (chlorahexidine gluconate) soap before surgery.  CHG is an antiseptic cleaner which kills germs and bonds with the skin to continue killing germs even after washing. Please DO NOT use if you have an allergy to CHG or antibacterial soaps.  If your skin becomes reddened/irritated stop using the CHG and inform your nurse when you arrive at Short Stay. Do not shave (including legs and underarms) for at least 48 hours prior to the first CHG shower.  You may shave your face/neck. Please follow these instructions carefully:  1.  Shower with CHG Soap the night before surgery and the  morning of Surgery.  2.  If you choose to wash your hair, wash your hair first as usual with your  normal  shampoo.  3.  After you shampoo, rinse your hair and body thoroughly to remove the  shampoo.  4.  Use CHG as you would any other liquid soap.  You can apply chg directly  to the skin and wash                       Gently with a scrungie or clean washcloth.  5.  Apply the CHG Soap to your body ONLY FROM THE NECK DOWN.   Do not use on face/ open                           Wound or open  sores. Avoid contact with eyes, ears mouth and genitals (private parts).                       Wash face,  Genitals (private parts) with your normal soap.             6.  Wash thoroughly, paying special attention to the area where your surgery  will be performed.  7.  Thoroughly rinse your body with warm water from the neck down.  8.  DO NOT shower/wash with your normal soap after using and rinsing off  the CHG Soap.                9.  Pat yourself dry with a clean towel.            10.  Wear clean pajamas.            11.  Place clean sheets on your bed the night of your first shower and do not  sleep with pets. Day of Surgery : Do not apply any lotions/deodorants the morning of surgery.  Please wear clean clothes to the hospital/surgery center.  FAILURE TO FOLLOW THESE INSTRUCTIONS MAY RESULT IN THE CANCELLATION OF YOUR SURGERY PATIENT SIGNATURE_________________________________  NURSE SIGNATURE__________________________________  ________________________________________________________________________  WHAT IS A BLOOD TRANSFUSION? Blood Transfusion Information  A transfusion is the replacement of blood or some of its parts. Blood is made up of multiple cells which provide different functions.  Red blood cells carry oxygen and are used for blood loss replacement.  White blood cells fight against infection.  Platelets control bleeding.  Plasma helps clot blood.  Other blood products are available for specialized needs, such as hemophilia or other clotting disorders. BEFORE THE TRANSFUSION  Who gives blood for transfusions?   Healthy volunteers who are fully evaluated to make sure their blood is safe. This is blood bank blood. Transfusion therapy is the safest it has ever been in the practice of medicine. Before blood is taken from a donor, a complete history is taken to make sure that person has no history of diseases nor engages in risky social behavior (examples are intravenous  drug use or sexual activity with multiple partners). The donor's travel history is screened to minimize risk of transmitting infections, such as malaria. The donated blood is tested for signs of infectious diseases, such as HIV and hepatitis. The blood is then tested to be sure it is compatible with you in order to minimize the chance of a transfusion reaction. If you or a relative donates blood, this is often done in anticipation of surgery and is not appropriate for emergency situations. It takes many days to process the donated blood. RISKS AND COMPLICATIONS Although transfusion therapy is very safe and saves many lives, the main dangers of transfusion include:   Getting an infectious disease.  Developing a transfusion reaction.  This is an allergic reaction to something in the blood you were given. Every precaution is taken to prevent this. The decision to have a blood transfusion has been considered carefully by your caregiver before blood is given. Blood is not given unless the benefits outweigh the risks. AFTER THE TRANSFUSION  Right after receiving a blood transfusion, you will usually feel much better and more energetic. This is especially true if your red blood cells have gotten low (anemic). The transfusion raises the level of the red blood cells which carry oxygen, and this usually causes an energy increase.  The nurse administering the transfusion will monitor you carefully for complications. HOME CARE INSTRUCTIONS  No special instructions are needed after a transfusion. You may find your energy is better. Speak with your caregiver about any limitations on activity for underlying diseases you may have. SEEK MEDICAL CARE IF:   Your condition is not improving after your transfusion.  You develop redness or irritation at the intravenous (IV) site. SEEK IMMEDIATE MEDICAL CARE IF:  Any of the following symptoms occur over the next 12 hours:  Shaking chills.  You have a temperature by  mouth above 102 F (38.9 C), not controlled by medicine.  Chest, back, or muscle pain.  People around you feel you are not acting correctly or are confused.  Shortness of breath or difficulty breathing.  Dizziness and fainting.  You get a rash or develop hives.  You have a decrease in urine output.  Your urine turns a dark color or changes to pink, red, or brown. Any of the following symptoms occur over the next 10 days:  You have a temperature by mouth above 102 F (38.9 C), not controlled by medicine.  Shortness of breath.  Weakness after normal activity.  The white part of the eye turns yellow (jaundice).  You have a decrease in the amount of urine or are urinating less often.  Your urine turns a dark color or changes to pink, red, or brown. Document Released: 12/24/1999 Document Revised: 03/20/2011 Document Reviewed: 08/12/2007 [facility] Patient Information 2014 [place], [place].  _______________________________________________________________________

## 2017-09-03 ENCOUNTER — Encounter (HOSPITAL_COMMUNITY)
Admission: RE | Admit: 2017-09-03 | Discharge: 2017-09-03 | Disposition: A | Discharge status: Home / Self Care | Payer: Medicare Other | Source: Ambulatory Visit | Attending: Urology | Admitting: Urology

## 2017-09-03 ENCOUNTER — Encounter (HOSPITAL_COMMUNITY): Payer: Self-pay | Admitting: *Deleted

## 2017-09-03 ENCOUNTER — Other Ambulatory Visit: Payer: Self-pay

## 2017-09-03 DIAGNOSIS — N1330 Unspecified hydronephrosis: Secondary | ICD-10-CM | POA: Diagnosis not present

## 2017-09-03 DIAGNOSIS — R9431 Abnormal electrocardiogram [ECG] [EKG]: Secondary | ICD-10-CM | POA: Insufficient documentation

## 2017-09-03 DIAGNOSIS — I10 Essential (primary) hypertension: Secondary | ICD-10-CM | POA: Diagnosis not present

## 2017-09-03 DIAGNOSIS — Z01810 Encounter for preprocedural cardiovascular examination: Secondary | ICD-10-CM | POA: Insufficient documentation

## 2017-09-03 DIAGNOSIS — Z01812 Encounter for preprocedural laboratory examination: Secondary | ICD-10-CM | POA: Insufficient documentation

## 2017-09-03 LAB — BASIC METABOLIC PANEL
ANION GAP: 10 (ref 5–15)
BUN: 30 mg/dL — AB (ref 8–23)
CO2: 26 mmol/L (ref 22–32)
CREATININE: 1.25 mg/dL — AB (ref 0.44–1.00)
Calcium: 10 mg/dL (ref 8.9–10.3)
Chloride: 105 mmol/L (ref 98–111)
GFR calc Af Amer: 42 mL/min — ABNORMAL LOW (ref >60)
GFR, EST NON AFRICAN AMERICAN: 36 mL/min — AB (ref >60)
GLUCOSE: 111 mg/dL — AB (ref 70–99)
Potassium: 4.3 mmol/L (ref 3.5–5.1)
Sodium: 141 mmol/L (ref 135–145)

## 2017-09-03 LAB — CBC
HCT: 40.8 % (ref 36.0–46.0)
Hemoglobin: 13.4 g/dL (ref 12.0–15.0)
MCH: 29.4 pg (ref 26.0–34.0)
MCHC: 32.8 g/dL (ref 30.0–36.0)
MCV: 89.5 fL (ref 78.0–100.0)
Platelets: ADEQUATE 10*3/uL (ref 150–400)
RBC: 4.56 MIL/uL (ref 3.87–5.11)
RDW: 13 % (ref 11.5–15.5)
WBC: 4.6 10*3/uL (ref 4.0–10.5)

## 2017-09-03 NOTE — Progress Notes (Signed)
09-07-17 BMP result routed to Dr. Tresa Moore for review

## 2017-09-04 NOTE — Progress Notes (Signed)
Final EKG done 09/03/17-epic

## 2017-09-05 ENCOUNTER — Other Ambulatory Visit: Payer: Self-pay | Admitting: Radiology

## 2017-09-06 ENCOUNTER — Other Ambulatory Visit: Payer: Self-pay | Admitting: Student

## 2017-09-06 MED ORDER — GENTAMICIN SULFATE 40 MG/ML IJ SOLN
5.0000 mg/kg | INTRAVENOUS | Status: DC
  Filled 2017-09-06: qty 7.5

## 2017-09-07 ENCOUNTER — Encounter (HOSPITAL_COMMUNITY)
Admission: RE | Disposition: A | Discharge status: Home / Self Care | Payer: Self-pay | Source: Ambulatory Visit | Attending: Urology

## 2017-09-07 ENCOUNTER — Ambulatory Visit (HOSPITAL_COMMUNITY)
Admission: RE | Admit: 2017-09-07 | Discharge: 2017-09-07 | Disposition: A | Discharge status: Home / Self Care | Payer: Medicare Other | Source: Ambulatory Visit | Attending: Urology | Admitting: Urology

## 2017-09-07 ENCOUNTER — Observation Stay (HOSPITAL_COMMUNITY)
Admission: RE | Admit: 2017-09-07 | Discharge: 2017-09-09 | Disposition: A | Discharge status: Home / Self Care | Payer: Medicare Other | Source: Ambulatory Visit | Attending: Urology | Admitting: Urology

## 2017-09-07 ENCOUNTER — Inpatient Hospital Stay (HOSPITAL_COMMUNITY): Payer: Medicare Other

## 2017-09-07 ENCOUNTER — Inpatient Hospital Stay (HOSPITAL_COMMUNITY): Payer: Medicare Other | Admitting: Anesthesiology

## 2017-09-07 ENCOUNTER — Other Ambulatory Visit: Payer: Self-pay

## 2017-09-07 ENCOUNTER — Encounter (HOSPITAL_COMMUNITY): Payer: Self-pay | Admitting: *Deleted

## 2017-09-07 DIAGNOSIS — Z9842 Cataract extraction status, left eye: Secondary | ICD-10-CM | POA: Insufficient documentation

## 2017-09-07 DIAGNOSIS — K2270 Barrett's esophagus without dysplasia: Secondary | ICD-10-CM | POA: Diagnosis not present

## 2017-09-07 DIAGNOSIS — Z8509 Personal history of malignant neoplasm of other digestive organs: Secondary | ICD-10-CM | POA: Diagnosis not present

## 2017-09-07 DIAGNOSIS — R531 Weakness: Secondary | ICD-10-CM | POA: Insufficient documentation

## 2017-09-07 DIAGNOSIS — Z8249 Family history of ischemic heart disease and other diseases of the circulatory system: Secondary | ICD-10-CM | POA: Insufficient documentation

## 2017-09-07 DIAGNOSIS — Z79899 Other long term (current) drug therapy: Secondary | ICD-10-CM | POA: Insufficient documentation

## 2017-09-07 DIAGNOSIS — Z90722 Acquired absence of ovaries, bilateral: Secondary | ICD-10-CM | POA: Diagnosis not present

## 2017-09-07 DIAGNOSIS — I10 Essential (primary) hypertension: Secondary | ICD-10-CM | POA: Diagnosis not present

## 2017-09-07 DIAGNOSIS — Z87891 Personal history of nicotine dependence: Secondary | ICD-10-CM | POA: Diagnosis not present

## 2017-09-07 DIAGNOSIS — R262 Difficulty in walking, not elsewhere classified: Secondary | ICD-10-CM | POA: Diagnosis not present

## 2017-09-07 DIAGNOSIS — Z888 Allergy status to other drugs, medicaments and biological substances status: Secondary | ICD-10-CM | POA: Insufficient documentation

## 2017-09-07 DIAGNOSIS — Z85828 Personal history of other malignant neoplasm of skin: Secondary | ICD-10-CM | POA: Insufficient documentation

## 2017-09-07 DIAGNOSIS — Z885 Allergy status to narcotic agent status: Secondary | ICD-10-CM | POA: Diagnosis not present

## 2017-09-07 DIAGNOSIS — Z90710 Acquired absence of both cervix and uterus: Secondary | ICD-10-CM | POA: Insufficient documentation

## 2017-09-07 DIAGNOSIS — Z8551 Personal history of malignant neoplasm of bladder: Secondary | ICD-10-CM | POA: Diagnosis not present

## 2017-09-07 DIAGNOSIS — N1330 Unspecified hydronephrosis: Secondary | ICD-10-CM

## 2017-09-07 DIAGNOSIS — Z9841 Cataract extraction status, right eye: Secondary | ICD-10-CM | POA: Diagnosis not present

## 2017-09-07 DIAGNOSIS — E039 Hypothyroidism, unspecified: Secondary | ICD-10-CM | POA: Diagnosis not present

## 2017-09-07 DIAGNOSIS — Z66 Do not resuscitate: Secondary | ICD-10-CM | POA: Diagnosis not present

## 2017-09-07 DIAGNOSIS — Z9889 Other specified postprocedural states: Secondary | ICD-10-CM | POA: Insufficient documentation

## 2017-09-07 DIAGNOSIS — N131 Hydronephrosis with ureteral stricture, not elsewhere classified: Secondary | ICD-10-CM | POA: Diagnosis not present

## 2017-09-07 DIAGNOSIS — Z823 Family history of stroke: Secondary | ICD-10-CM | POA: Insufficient documentation

## 2017-09-07 DIAGNOSIS — C679 Malignant neoplasm of bladder, unspecified: Secondary | ICD-10-CM | POA: Diagnosis not present

## 2017-09-07 HISTORY — PX: NEPHROLITHOTOMY: SHX5134

## 2017-09-07 HISTORY — PX: IR URETERAL STENT LEFT NEW ACCESS W/O SEP NEPHROSTOMY CATH: IMG6075

## 2017-09-07 LAB — TYPE AND SCREEN
ABO/RH(D): O NEG
Antibody Screen: NEGATIVE

## 2017-09-07 LAB — CBC WITH DIFFERENTIAL/PLATELET
BASOS ABS: 0 10*3/uL (ref 0.0–0.1)
BASOS PCT: 0 %
Eosinophils Absolute: 0 10*3/uL (ref 0.0–0.7)
Eosinophils Relative: 1 %
HEMATOCRIT: 43.4 % (ref 36.0–46.0)
HEMOGLOBIN: 14.5 g/dL (ref 12.0–15.0)
Lymphocytes Relative: 23 %
Lymphs Abs: 1 10*3/uL (ref 0.7–4.0)
MCH: 30 pg (ref 26.0–34.0)
MCHC: 33.4 g/dL (ref 30.0–36.0)
MCV: 89.7 fL (ref 78.0–100.0)
MONOS PCT: 10 %
Monocytes Absolute: 0.5 10*3/uL (ref 0.1–1.0)
NEUTROS ABS: 2.9 10*3/uL (ref 1.7–7.7)
NEUTROS PCT: 66 %
Platelets: ADEQUATE 10*3/uL (ref 150–400)
RBC: 4.84 MIL/uL (ref 3.87–5.11)
RDW: 12.9 % (ref 11.5–15.5)
WBC: 4.5 10*3/uL (ref 4.0–10.5)

## 2017-09-07 LAB — PROTIME-INR
INR: 0.92
Prothrombin Time: 12.2 seconds (ref 11.4–15.2)

## 2017-09-07 SURGERY — NEPHROLITHOTOMY PERCUTANEOUS
Anesthesia: General | Laterality: Left

## 2017-09-07 MED ORDER — CIPROFLOXACIN IN D5W 400 MG/200ML IV SOLN
INTRAVENOUS | Status: AC
  Filled 2017-09-07: qty 200

## 2017-09-07 MED ORDER — PROPOFOL 10 MG/ML IV BOLUS
INTRAVENOUS | Status: DC | PRN
  Administered 2017-09-07: 70 mg via INTRAVENOUS

## 2017-09-07 MED ORDER — TRAMADOL HCL 50 MG PO TABS
50.0000 mg | ORAL_TABLET | Freq: Two times a day (BID) | ORAL | Status: DC | PRN
  Administered 2017-09-07 – 2017-09-08 (×2): 50 mg via ORAL
  Filled 2017-09-07 (×2): qty 1

## 2017-09-07 MED ORDER — CIPROFLOXACIN IN D5W 400 MG/200ML IV SOLN
INTRAVENOUS | Status: DC | PRN
  Administered 2017-09-07: 400 mg via INTRAVENOUS

## 2017-09-07 MED ORDER — SODIUM CHLORIDE 0.9 % IR SOLN
Status: DC | PRN
  Administered 2017-09-07: 6000 mL

## 2017-09-07 MED ORDER — SUGAMMADEX SODIUM 200 MG/2ML IV SOLN
INTRAVENOUS | Status: DC | PRN
  Administered 2017-09-07: 150 mg via INTRAVENOUS

## 2017-09-07 MED ORDER — TRAMADOL HCL 50 MG PO TABS: 50.0000 mg | ORAL_TABLET | Freq: Four times a day (QID) | ORAL | 0 refills | Status: DC | PRN

## 2017-09-07 MED ORDER — FENTANYL CITRATE (PF) 100 MCG/2ML IJ SOLN
INTRAMUSCULAR | Status: DC | PRN
  Administered 2017-09-07: 50 ug via INTRAVENOUS

## 2017-09-07 MED ORDER — DEXAMETHASONE SODIUM PHOSPHATE 10 MG/ML IJ SOLN
INTRAMUSCULAR | Status: DC | PRN
  Administered 2017-09-07: 5 mg via INTRAVENOUS

## 2017-09-07 MED ORDER — FENTANYL CITRATE (PF) 100 MCG/2ML IJ SOLN: 25.0000 ug | INTRAMUSCULAR | Status: DC | PRN

## 2017-09-07 MED ORDER — FENTANYL CITRATE (PF) 100 MCG/2ML IJ SOLN
INTRAMUSCULAR | Status: AC
  Filled 2017-09-07: qty 2

## 2017-09-07 MED ORDER — ONDANSETRON HCL 4 MG/2ML IJ SOLN
INTRAMUSCULAR | Status: DC | PRN
  Administered 2017-09-07: 4 mg via INTRAVENOUS

## 2017-09-07 MED ORDER — ROCURONIUM BROMIDE 10 MG/ML (PF) SYRINGE
PREFILLED_SYRINGE | INTRAVENOUS | Status: DC | PRN
  Administered 2017-09-07: 40 mg via INTRAVENOUS

## 2017-09-07 MED ORDER — CIPROFLOXACIN HCL 500 MG PO TABS: 500.0000 mg | ORAL_TABLET | Freq: Two times a day (BID) | ORAL | 0 refills | Status: DC

## 2017-09-07 MED ORDER — LIDOCAINE HCL 1 % IJ SOLN
INTRAMUSCULAR | Status: AC | PRN
  Administered 2017-09-07: 10 mL

## 2017-09-07 MED ORDER — MIDAZOLAM HCL 2 MG/2ML IJ SOLN
INTRAMUSCULAR | Status: AC
  Filled 2017-09-07: qty 4

## 2017-09-07 MED ORDER — IOPAMIDOL (ISOVUE-300) INJECTION 61%
INTRAVENOUS | Status: AC
  Administered 2017-09-07: 25 mL
  Filled 2017-09-07: qty 50

## 2017-09-07 MED ORDER — CEFAZOLIN SODIUM-DEXTROSE 2-4 GM/100ML-% IV SOLN
2.0000 g | INTRAVENOUS | Status: AC
  Administered 2017-09-07: 2 g via INTRAVENOUS

## 2017-09-07 MED ORDER — PROPOFOL 10 MG/ML IV BOLUS
INTRAVENOUS | Status: AC
  Filled 2017-09-07: qty 20

## 2017-09-07 MED ORDER — FENTANYL CITRATE (PF) 100 MCG/2ML IJ SOLN
INTRAMUSCULAR | Status: AC | PRN
  Administered 2017-09-07 (×4): 25 ug via INTRAVENOUS

## 2017-09-07 MED ORDER — LACTATED RINGERS IV SOLN
INTRAVENOUS | Status: DC
  Administered 2017-09-07 (×2): via INTRAVENOUS

## 2017-09-07 MED ORDER — FENTANYL CITRATE (PF) 250 MCG/5ML IJ SOLN
INTRAMUSCULAR | Status: AC
  Filled 2017-09-07: qty 5

## 2017-09-07 MED ORDER — EPHEDRINE SULFATE-NACL 50-0.9 MG/10ML-% IV SOSY
PREFILLED_SYRINGE | INTRAVENOUS | Status: DC | PRN
  Administered 2017-09-07: 10 mg via INTRAVENOUS

## 2017-09-07 MED ORDER — LEVOTHYROXINE SODIUM 88 MCG PO TABS
88.0000 ug | ORAL_TABLET | Freq: Every day | ORAL | Status: DC
  Administered 2017-09-08 – 2017-09-09 (×2): 88 ug via ORAL
  Filled 2017-09-07 (×2): qty 1

## 2017-09-07 MED ORDER — MAGNESIUM CITRATE PO SOLN: 1.0000 | Freq: Once | ORAL | Status: DC

## 2017-09-07 MED ORDER — ONDANSETRON HCL 4 MG/2ML IJ SOLN: 4.0000 mg | INTRAMUSCULAR | Status: DC | PRN

## 2017-09-07 MED ORDER — PROMETHAZINE HCL 25 MG/ML IJ SOLN: 6.2500 mg | INTRAMUSCULAR | Status: DC | PRN

## 2017-09-07 MED ORDER — CEFAZOLIN SODIUM-DEXTROSE 2-4 GM/100ML-% IV SOLN
INTRAVENOUS | Status: AC
  Filled 2017-09-07: qty 100

## 2017-09-07 MED ORDER — ACETAMINOPHEN 325 MG PO TABS: 650.0000 mg | ORAL_TABLET | Freq: Four times a day (QID) | ORAL | Status: DC | PRN

## 2017-09-07 MED ORDER — MEPERIDINE HCL 50 MG/ML IJ SOLN: 6.2500 mg | INTRAMUSCULAR | Status: DC | PRN

## 2017-09-07 MED ORDER — LIDOCAINE HCL (PF) 1 % IJ SOLN
INTRAMUSCULAR | Status: AC | PRN
  Administered 2017-09-07: 10 mL

## 2017-09-07 MED ORDER — LIDOCAINE HCL 1 % IJ SOLN
INTRAMUSCULAR | Status: AC
  Filled 2017-09-07: qty 20

## 2017-09-07 MED ORDER — MIDAZOLAM HCL 2 MG/2ML IJ SOLN
INTRAMUSCULAR | Status: AC | PRN
  Administered 2017-09-07 (×2): 1 mg via INTRAVENOUS
  Administered 2017-09-07 (×2): 0.5 mg via INTRAVENOUS
  Administered 2017-09-07: 1 mg via INTRAVENOUS

## 2017-09-07 MED ORDER — LIDOCAINE 2% (20 MG/ML) 5 ML SYRINGE
INTRAMUSCULAR | Status: DC | PRN
  Administered 2017-09-07: 60 mg via INTRAVENOUS

## 2017-09-07 MED ORDER — SODIUM CHLORIDE 0.9 % IV SOLN
INTRAVENOUS | Status: DC
  Administered 2017-09-07: 18:00:00 via INTRAVENOUS

## 2017-09-07 MED ORDER — IOPAMIDOL (ISOVUE-300) INJECTION 61%
50.0000 mL | Freq: Once | INTRAVENOUS | Status: AC | PRN
  Administered 2017-09-07: 25 mL

## 2017-09-07 MED ORDER — IOHEXOL 300 MG/ML  SOLN
INTRAMUSCULAR | Status: DC | PRN
  Administered 2017-09-07: 10 mL

## 2017-09-07 SURGICAL SUPPLY — 74 items
APL SKNCLS STERI-STRIP NONHPOA (GAUZE/BANDAGES/DRESSINGS) ×1
BAG URINE DRAINAGE (UROLOGICAL SUPPLIES) ×4 IMPLANT
BAG URO CATCHER STRL LF (MISCELLANEOUS) ×1 IMPLANT
BASKET LASER NITINOL 1.9FR (BASKET) ×1 IMPLANT
BASKET ZERO TIP NITINOL 2.4FR (BASKET) ×1 IMPLANT
BENZOIN TINCTURE PRP APPL 2/3 (GAUZE/BANDAGES/DRESSINGS) ×4 IMPLANT
BLADE SURG 15 STRL LF DISP TIS (BLADE) ×1 IMPLANT
BLADE SURG 15 STRL SS (BLADE)
BSKT STON RTRVL 120 1.9FR (BASKET)
BSKT STON RTRVL ZERO TP 2.4FR (BASKET)
CATCHER STONE W/TUBE ADAPTER (UROLOGICAL SUPPLIES) ×1 IMPLANT
CATH FOLEY 2W COUNCIL 20FR 5CC (CATHETERS) IMPLANT
CATH FOLEY 2WAY SLVR  5CC 16FR (CATHETERS)
CATH FOLEY 2WAY SLVR 5CC 16FR (CATHETERS) ×1 IMPLANT
CATH IMAGER II 65CM (CATHETERS) ×1 IMPLANT
CATH INTERMIT  6FR 70CM (CATHETERS) ×1 IMPLANT
CATH MULTI PURPOSE 16FR DRAIN (CATHETERS) ×1 IMPLANT
CATH ROBINSON RED A/P 20FR (CATHETERS) IMPLANT
CATH ULTRATHANE 14FR (CATHETERS) ×1 IMPLANT
CATH UROLOGY TORQUE 40 (MISCELLANEOUS) ×2 IMPLANT
CATH X-FORCE N30 NEPHROSTOMY (TUBING) ×1 IMPLANT
CHLORAPREP W/TINT 26ML (MISCELLANEOUS) ×2 IMPLANT
COVER SURGICAL LIGHT HANDLE (MISCELLANEOUS) ×1 IMPLANT
DRAPE C-ARM 42X120 X-RAY (DRAPES) ×3 IMPLANT
DRAPE LINGEMAN PERC (DRAPES) ×3 IMPLANT
DRAPE SHEET LG 3/4 BI-LAMINATE (DRAPES) ×1 IMPLANT
DRAPE SURG IRRIG POUCH 19X23 (DRAPES) ×3 IMPLANT
DRSG PAD ABDOMINAL 8X10 ST (GAUZE/BANDAGES/DRESSINGS) ×2 IMPLANT
DRSG TEGADERM 8X12 (GAUZE/BANDAGES/DRESSINGS) ×4 IMPLANT
FIBER LASER FLEXIVA 1000 (UROLOGICAL SUPPLIES) IMPLANT
FIBER LASER FLEXIVA 365 (UROLOGICAL SUPPLIES) IMPLANT
FIBER LASER FLEXIVA 550 (UROLOGICAL SUPPLIES) IMPLANT
FIBER LASER TRAC TIP (UROLOGICAL SUPPLIES) IMPLANT
GAUZE SPONGE 4X4 12PLY STRL (GAUZE/BANDAGES/DRESSINGS) ×3 IMPLANT
GLOVE BIOGEL M STRL SZ7.5 (GLOVE) ×5 IMPLANT
GOWN STRL REUS W/TWL LRG LVL3 (GOWN DISPOSABLE) ×6 IMPLANT
GUIDEWIRE AMPLAZ .035X145 (WIRE) ×4 IMPLANT
GUIDEWIRE ANG ZIPWIRE 038X150 (WIRE) ×4 IMPLANT
GUIDEWIRE STR DUAL SENSOR (WIRE) ×2 IMPLANT
IV SET EXTENSION CATH 6 NF (IV SETS) ×1 IMPLANT
KIT BALLN UROMAX 15FX4 (MISCELLANEOUS) IMPLANT
KIT BALLN UROMAX 26 75X4 (MISCELLANEOUS) ×2
KIT BASIN OR (CUSTOM PROCEDURE TRAY) ×3 IMPLANT
MANIFOLD NEPTUNE II (INSTRUMENTS) ×3 IMPLANT
NDL TROCAR 18X15 ECHO (NEEDLE) IMPLANT
NDL TROCAR 18X20 (NEEDLE) IMPLANT
NEEDLE TROCAR 18X15 ECHO (NEEDLE) IMPLANT
NEEDLE TROCAR 18X20 (NEEDLE) IMPLANT
NS IRRIG 1000ML POUR BTL (IV SOLUTION) ×1 IMPLANT
PACK CYSTO (CUSTOM PROCEDURE TRAY) ×3 IMPLANT
PROBE LITHOCLAST ULTRA 3.8X403 (UROLOGICAL SUPPLIES) IMPLANT
PROBE PNEUMATIC 1.0MMX570MM (UROLOGICAL SUPPLIES) ×1 IMPLANT
SET IRRIG Y TYPE TUR BLADDER L (SET/KITS/TRAYS/PACK) ×1 IMPLANT
SHEATH ACCESS URETERAL 24CM (SHEATH) ×2 IMPLANT
SHEATH PEELAWAY SET 9 (SHEATH) ×1 IMPLANT
SPONGE LAP 4X18 RFD (DISPOSABLE) ×3 IMPLANT
STONE CATCHER W/TUBE ADAPTER (UROLOGICAL SUPPLIES) IMPLANT
SUT ETHILON 3 0 PS 1 (SUTURE) ×2 IMPLANT
SUT SILK 2 0 30  PSL (SUTURE)
SUT SILK 2 0 30 PSL (SUTURE) ×1 IMPLANT
SUT VIC AB 2-0 UR5 27 (SUTURE) ×2 IMPLANT
SYR 10ML LL (SYRINGE) ×1 IMPLANT
SYR 20CC LL (SYRINGE) ×4 IMPLANT
SYR 50ML LL SCALE MARK (SYRINGE) ×1 IMPLANT
SYSTEM UROSTOMY GENTLE TOUCH (WOUND CARE) ×2 IMPLANT
TOWEL OR 17X26 10 PK STRL BLUE (TOWEL DISPOSABLE) ×3 IMPLANT
TRAY FOLEY MTR SLVR 16FR STAT (SET/KITS/TRAYS/PACK) ×1 IMPLANT
TUBE CONNECTING VINYL 14FR 30C (TUBING) ×3 IMPLANT
TUBE FEEDING 8FR 16IN STR KANG (MISCELLANEOUS) ×1 IMPLANT
TUBING CONNECTING 10 (TUBING) ×4 IMPLANT
TUBING CONNECTING 10' (TUBING) ×1
TUBING UROLOGY SET (TUBING) ×2 IMPLANT
WATER STERILE IRR 1000ML POUR (IV SOLUTION) ×1 IMPLANT
WATER STERILE IRR 3000ML UROMA (IV SOLUTION) ×1 IMPLANT

## 2017-09-07 NOTE — Brief Op Note (Signed)
09/07/2017  2:59 PM  PATIENT:  Victoria Patrick  84 y.o. female  PRE-OPERATIVE DIAGNOSIS:  LEFT HYDRONEPHROSIS  POST-OPERATIVE DIAGNOSIS:  * No post-op diagnosis entered *  PROCEDURE:  Procedure(s): LEFT ANTEGRADE URETEROSCOPY, DILATING OF STRICTURE (Left)  SURGEON:  Surgeon(s) and Role:    * Alexis Frock, MD - Primary  PHYSICIAN ASSISTANT:   ASSISTANTS: none   ANESTHESIA:   general  EBL:  25 mL   BLOOD ADMINISTERED:none  DRAINS: left nephroureteral stent, capped   LOCAL MEDICATIONS USED:  NONE  SPECIMEN:  No Specimen  DISPOSITION OF SPECIMEN:  N/A  COUNTS:  YES  TOURNIQUET:  * No tourniquets in log *  DICTATION: .Other Dictation: Dictation Number [id number]  PLAN OF CARE: Admit for overnight observation  PATIENT DISPOSITION:  PACU - hemodynamically stable.   Delay start of Pharmacological VTE agent (>24hrs) due to surgical blood loss or risk of bleeding: yes

## 2017-09-07 NOTE — Procedures (Signed)
  Pre-operative Diagnosis: Left hydronephrosis with anastomosis obstruction or stricture       Post-operative Diagnosis: Minimal left hydronephrosis, question stricture but no obstruction   Indications: Left hydronephrosis and concern for ureteral anastomosis stricture or neoplastic disease  Procedure: Placement of left nephroureteral catheter.  Findings: Minimal hydronephrosis today.  Catheter placed from lower pole calyx.  Catheter and wire successfully advanced into ileal conduit and into bag.  Dilatation of left ureter but contrast filled right collecting system and conduit.    Complications: None     EBL: Minimal  Plan: To OR for ureteroscopy.

## 2017-09-07 NOTE — Anesthesia Postprocedure Evaluation (Signed)
Anesthesia Post Note  Patient: Victoria Patrick  Procedure(s) Performed: LEFT ANTEGRADE URETEROSCOPY, DILATING OF STRICTURE (Left )     Patient location during evaluation: PACU Anesthesia Type: General Level of consciousness: sedated and patient cooperative Pain management: pain level controlled Vital Signs Assessment: post-procedure vital signs reviewed and stable Respiratory status: spontaneous breathing Cardiovascular status: stable Anesthetic complications: no    Last Vitals:  Vitals:   09/07/17 1545 09/07/17 1609  BP: 140/61 138/68  Pulse: 75 67  Resp: 16 16  Temp: 36.5 C (!) 36.3 C  SpO2: 95% 100%    Last Pain:  Vitals:   09/07/17 1643  TempSrc:   PainSc: 0-No pain                 Nolon Nations

## 2017-09-07 NOTE — Transfer of Care (Signed)
Immediate Anesthesia Transfer of Care Note  Patient: Victoria Patrick  Procedure(s) Performed: LEFT ANTEGRADE URETEROSCOPY, DILATING OF STRICTURE (Left )  Patient Location: PACU  Anesthesia Type:General  Level of Consciousness: awake, alert  and oriented  Airway & Oxygen Therapy: Patient Spontanous Breathing and Patient connected to face mask oxygen  Post-op Assessment: Report given to RN and Post -op Vital signs reviewed and stable  Post vital signs: Reviewed and stable  Last Vitals:  Vitals Value Taken Time  BP 134/74 09/07/2017  3:20 PM  Temp    Pulse 80 09/07/2017  3:24 PM  Resp 14 09/07/2017  3:24 PM  SpO2 100 % 09/07/2017  3:24 PM  Vitals shown include unvalidated device data.  Last Pain:  Vitals:   09/07/17 1520  TempSrc:   PainSc: (P) 0-No pain         Complications: No apparent anesthesia complications

## 2017-09-07 NOTE — Op Note (Signed)
NAME: Victoria Patrick, Victoria Patrick MEDICAL RECORD [id number] ACCOUNT [id number] DATE OF BIRTH:Jan 14, 1922 FACILITY: [facility] LOCATION: [facility] PHYSICIAN:Eilis Chestnutt, MD  OPERATIVE REPORT  DATE OF PROCEDURE:  09/07/2017  PREOPERATIVE DIAGNOSIS:  Left hydronephrosis, history of bladder cancer status post cystectomy.  PROCEDURE: 1.  Left antegrade ureteroscopy with dilation of stricture. 2.  Exchange of left nephroureteral stent. 3.  Left nephrostogram interpretation.  ESTIMATED BLOOD LOSS:  Nil.  COMPLICATIONS:  None.  SPECIMENS:  None.  FINDINGS: 1.  Mild relative ureteroenteric stricture approximately 6-French predilation, 15-French post-dilation. 2.  No evidence of any recurrence intraluminal tumor within the left kidney and ureter.  DRAINS:  A 5-French nephroureteral stent capped.  INDICATIONS:  The patient is a very vigorous and pleasant 84 year old lady with history of bladder cancer.  She is status post cystectomy approximately 3 years ago.  She has had no evidence of recurrence.  She was found on workup of a transient left  flank pain to have some new hydronephrosis by CT scan in the ER earlier last month.  This was concerning for possible enteric ureteral stricture versus a ureteral tumor recurrence.  Given her otherwise fantastic functional status, it was felt that  ureteroscopy would be warranted to rule out high grade stricture or cancer recurrence.  She wished to proceed.  Given the conduit diversion, interventional radiology access was achieved earlier today and initial antegrade studies suggested worsening of  the hydronephrosis.  Informed consent was obtained and placed in medical record.  DESCRIPTION OF PROCEDURE:  The patient was identified.  The procedure being left ureteroscopy, dilation and possible stricture versus biopsy was confirmed.  Procedure timeout was performed.  Antibiotics administered.  General endotracheal anesthesia  induced.  The patient was placed in the  prone position employing prone view.  Axillary rolls, chest rolls and padding of her knees.  Her in situ ureteroscopy was connected to straight drain.  The patient's in situ nephroureteral stent was prepped into a  sterile field including her left flank using chlorhexidine gluconate.  A ZIPwire was advanced down this to the level of the conduit and exchanged via the KMP catheter for a Super Stiff wire.  A small skin incision was made at the flank approximately 7 mm  in length and a 12/14 x 28 cm ureteral access sheath was carefully advanced using continuous fluoroscopic guidance to the level of the mid ureter.  Antegrade ureteroscopy was performed.  Antegrade ureteroscopy revealed normal caliber left ureter all the way to the ureteroenteric anastomosis.  At this area there was a relative narrowing at approximately 6-French.  There was no evidence of papillary tumor.  This was again not a high-grade  narrowing; however, given her recent history of hydronephrosis and recent flank pain, it was felt that balloon dilation would be warranted.  As such, a sensor wire was advanced across this and a 4 cm length 15-French diameter balloon dilation catheter  was advanced across this using fluoroscopic and ureteroscopic guidance.  This was inflated to a pressure of 20 atmospheres, held for 90 seconds and then released.  Ureteroscopy then revealed resolution of the relative narrowing and no evidence of  high-grade obstruction or intraluminal tumor.  This is quite favorable.  Leaving the wire in place across the area of dilation, the access sheath was removed under continuous vision, no mucosal abnormalities were found.  A new KMP catheter was then  advanced across the area of dilation across the ureteroenteric anastomosis and capped.  It was fashioned to the skin using nylon  suture and a U-stitch of Vicryl was applied for hemostasis, which was excellent.  The procedure was terminated.  The patient  tolerated the  procedure well.  No immediate complications.  The patient was taken to the postanesthesia care in stable condition with plan for overnight observation.  TN/NUANCE  D:09/07/2017 T:09/07/2017 JOB:[id number]

## 2017-09-07 NOTE — Anesthesia Preprocedure Evaluation (Signed)
Anesthesia Evaluation  Patient identified by MRN, date of birth, ID band Patient awake    Reviewed: Allergy & Precautions, NPO status , Patient's Chart, lab work & pertinent test results  Airway Mallampati: II  TM Distance: >3 FB     Dental  (+) Dental Advisory Given, Chipped, Missing   Pulmonary former smoker,    breath sounds clear to auscultation       Cardiovascular hypertension, Pt. on medications + dysrhythmias  Rhythm:Regular Rate:Normal     Neuro/Psych PSYCHIATRIC DISORDERS    GI/Hepatic negative GI ROS, Neg liver ROS,   Endo/Other  negative endocrine ROSHypothyroidism   Renal/GU negative Renal ROS     Musculoskeletal   Abdominal   Peds  Hematology   Anesthesia Other Findings   Reproductive/Obstetrics                             Anesthesia Physical  Anesthesia Plan  ASA: III  Anesthesia Plan: General   Post-op Pain Management:    Induction: Intravenous  PONV Risk Score and Plan: 4 or greater and Ondansetron, Dexamethasone and Treatment may vary due to age or medical condition  Airway Management Planned: Oral ETT  Additional Equipment:   Intra-op Plan:   Post-operative Plan: Extubation in OR  Informed Consent: I have reviewed the patients History and Physical, chart, labs and discussed the procedure including the risks, benefits and alternatives for the proposed anesthesia with the patient or authorized representative who has indicated his/her understanding and acceptance.   Dental advisory given  Plan Discussed with: CRNA  Anesthesia Plan Comments:         Anesthesia Quick Evaluation

## 2017-09-07 NOTE — H&P (Signed)
Chief Complaint: Patient was seen in consultation today for left PCN placement at the request of [place]  Referring Physician(s): Manny,Theodore  Supervising Physician: Markus Daft  Patient Status: [facility] - Out-pt  History of Present Illness: Victoria Patrick is a 84 y.o. female with hx of prior cystectomy and ileal conduit. She was recently admitted with UTi and found to have left sided hydronephrosis secondary possible left ureteral stricture. She has seen Dr. Tresa Moore who has scheduled her today for IR to place a PCN followed by operative evaluation by himself. PMHx, meds, labs, imaging, allergies reviewed. Feels well, no recent fevers, chills, illness. Has been NPO today as directed. Family at bedside.   Past Medical History:  Diagnosis Date  . Barrett's esophagus   . Bladder cancer ([place]) 04/30/2014  . Dysrhythmia    occasional skip   . Gastric mass   . 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 Hypertension   . Hypothyroidism   . Memory disorder 08/30/2017  . Skin cancer    ON FACE    Past Surgical History:  Procedure Laterality Date  . CATARACT EXTRACTION, BILATERAL    . COLONOSCOPY    . CYSTOSCOPY N/A 04/30/2014   Procedure: CYSTOSCOPY;  Surgeon: Irine Seal, MD;  [facility]: [facility];  Service: Urology;  Laterality: N/A;  . CYSTOSCOPY WITH INJECTION N/A 06/26/2014   Procedure: CYSTOSCOPY WITH INJECTION OF INDOCYANINE GREEN DYE;  Surgeon: Alexis Frock, MD;  [facility];  Service: Urology;  Laterality: N/A;  . EUS N/A 09/28/2016   Procedure: UPPER ENDOSCOPIC ULTRASOUND (EUS) LINEAR;  Surgeon: [name], MD;  [facility];  Service: Endoscopy;  Laterality: N/A;  . hx of skin cancer removal     locally done   . ROBOT ASSISTED LAPAROSCOPIC COMPLETE CYSTECT ILEAL CONDUIT N/A 06/26/2014   Procedure: ROBOTIC ASSISTED LAPAROSCOPIC COMPLETE CYSTECT ILEAL CONDUIT;  Surgeon: Alexis Frock, MD;  [facility];  Service: Urology;   Laterality: N/A;  . ROBOTIC ASSISTED LAPAROSCOPIC HYSTERECTOMY AND SALPINGECTOMY N/A 06/26/2014   Procedure: ROBOTIC ASSISTED LAPAROSCOPIC HYSTERECTOMY AND BILATERAL SALPINGO-OPHERECTOMY;  Surgeon: Alexis Frock, MD;  [facility];  Service: Urology;  Laterality: N/A;  . TRANSURETHRAL RESECTION OF BLADDER TUMOR N/A 04/30/2014   Procedure: TRANSURETHRAL RESECTION OF BLADDER TUMOR (TURBT);  Surgeon: Irine Seal, MD;  [facility];  Service: Urology;  Laterality: N/A;  . UPPER GASTROINTESTINAL ENDOSCOPY      Allergies: Diovan [valsartan] and Codeine  Medications: Prior to Admission medications   Medication Sig Start Date End Date Taking? Authorizing Provider  CALCIUM CITRATE PO Take 630 mg by mouth daily.    [provider]  Cyanocobalamin (VITAMIN B-12) 5000 MCG TBDP Take 5,000 mcg by mouth daily.    [provider]  losartan (COZAAR) 100 MG tablet Take 1 tablet (100 mg total) by mouth daily. 09/03/14   Rai, Vernelle Emerald, MD  Multiple Vitamin (MULTIVITAMIN WITH MINERALS) TABS tablet Take 1 tablet by mouth daily.    [provider]  SYNTHROID 88 MCG tablet Take 88 mcg by mouth daily before breakfast.  03/19/13   [provider]     Family History  Problem Relation Age of Onset  . Stroke Maternal Aunt   . Hypertension Mother   . Bladder Cancer Neg Hx   . Stomach cancer Neg Hx   . Colon cancer Neg Hx   . Esophageal cancer Neg Hx   . Rectal cancer Neg Hx     Social  History   Socioeconomic History  . Marital status: Widowed    Spouse name: Not on file  . Number of children: Not on file  . Years of education: Not on file  . Highest education level: Not on file  Occupational History  . Not on file  Social Needs  . Financial resource strain: Not on file  . Food insecurity:    Worry: Not on file    Inability: Not on file  . Transportation needs:    Medical: Not on file    Non-medical: Not on file  Tobacco Use  . Smoking status: Former  Smoker    Packs/day: 0.50    Types: Cigarettes  . Smokeless tobacco: Never Used  . Tobacco comment: quit 30 yrs ago  Substance and Sexual Activity  . Alcohol use: Yes    Comment: no wine lately  . Drug use: No  . Sexual activity: Never    Partners: Male  Lifestyle  . Physical activity:    Days per week: Not on file    Minutes per session: Not on file  . Stress: Not on file  Relationships  . Social connections:    Talks on phone: Not on file    Gets together: Not on file    Attends religious service: Not on file    Active member of club or organization: Not on file    Attends meetings of clubs or organizations: Not on file    Relationship status: Not on file  Other Topics Concern  . Not on file  Social History Narrative   Right handed    Lives alone   Caffeine use: 2 cups coffee per day   1 glass tea per day    Review of Systems: A 12 point ROS discussed and pertinent positives are indicated in the HPI above.  All other systems are negative.  Review of Systems  Vital Signs: Temp: 98, HR: 70, RR: 16, BP: 144/87  Physical Exam  Constitutional: She is oriented to person, place, and time. She appears well-developed. No distress.  HENT:  Head: Normocephalic.  Mouth/Throat: Oropharynx is clear and moist.  Neck: Normal range of motion. No JVD present.  Cardiovascular: Normal rate, regular rhythm and normal heart sounds.  Pulmonary/Chest: Effort normal and breath sounds normal. No respiratory distress.  Abdominal: Soft. She exhibits no distension. There is no tenderness.  Neurological: She is alert and oriented to person, place, and time.  Skin: Skin is warm and dry.  Psychiatric: She has a normal mood and affect.    Imaging: No results found.  Labs:  CBC: Recent Labs    08/05/17 1742 09/03/17 1225  WBC 13.1* 4.6  HGB 13.2 13.4  HCT 41.5 40.8  PLT 50* PLATELET CLUMPS NOTED ON SMEAR, COUNT APPEARS ADEQUATE    COAGS: Recent Labs    09/07/17 1018  INR  0.92    BMP: Recent Labs    08/05/17 1742 09/03/17 1225  NA 135 141  K 4.2 4.3  CL 101 105  CO2 23 26  GLUCOSE 140* 111*  BUN 28* 30*  CALCIUM 9.5 10.0  CREATININE 1.18* 1.25*  GFRNONAA 39* 36*  GFRAA 45* 42*    LIVER FUNCTION TESTS: Recent Labs    08/05/17 1742  BILITOT 0.6  AST 18  ALT 12  ALKPHOS 56  PROT 7.3  ALBUMIN 4.0    TUMOR MARKERS: No results for input(s): AFPTM, CEA, CA199, CHROMGRNA in the last 8760 hours.  Assessment and Plan: (L)hydronephrosis  secondary to ureteral stricture. Plan for (L)PCN Labs pending Risks and benefits of (L)PCN were discussed with the patient including, but not limited to, infection, bleeding, significant bleeding causing loss or decrease in renal function or damage to adjacent structures.   All of the patient's questions were answered, patient is agreeable to proceed.  Consent signed and in chart.   Thank you for this interesting consult.  I greatly enjoyed meeting ELLANOR FEUERSTEIN and look forward to participating in their care.  A copy of this report was sent to the requesting provider on this date.  Electronically Signed: Ascencion Dike, PA-C 09/07/2017, 10:47 AM   I spent a total of 20 minutes in face to face in clinical consultation, greater than 50% of which was counseling/coordinating care for (L)PCN

## 2017-09-07 NOTE — Anesthesia Procedure Notes (Signed)
Procedure Name: Intubation Performed by: Gean Maidens, CRNA Pre-anesthesia Checklist: Patient identified, Emergency Drugs available, Suction available, Patient being monitored and Timeout performed Patient Re-evaluated:Patient Re-evaluated prior to induction Oxygen Delivery Method: Circle system utilized Preoxygenation: Pre-oxygenation with 100% oxygen Induction Type: IV induction Ventilation: Mask ventilation without difficulty Laryngoscope Size: Mac and 3 Grade View: Grade I Tube type: Oral Tube size: 7.0 mm Number of attempts: 1 Airway Equipment and Method: Stylet Placement Confirmation: ETT inserted through vocal cords under direct vision and positive ETCO2 Secured at: 21 cm Tube secured with: Tape Dental Injury: Teeth and Oropharynx as per pre-operative assessment

## 2017-09-07 NOTE — H&P (Signed)
Victoria Patrick is an 84 y.o. female.    Chief Complaint: Pre-Op Left Diagnostic Ureteroscopy  HPI:   1 - High Grade Bladder Cancer with Malignant Ureteral Obstruction - s/p robotic radical cystectomy with ICG sentinal + pelvic lymphadenectomy + hysterectomy/BSO + ileal conduit urinary diversion 06/26/2014 for pT1N0Mx recurrent high grade bladder cancer with negative margins. Had left malignant hydro pre-op. Pre-op staging CT w/o pelvic adenopathy or distant disease.    Post-op Surveillance:   02/2015 - CMP, CT, CXR NED, Cr 1.0; 07/2015 CMP,CT, CXR no recurrence, Cr 1.1  01/2016 - CMP, CXR, CT NED, Cr 1.1; 08/2016 CMP, CXR, CT no recurrence, ? left upper lung mass that on f/u CT negative, Cr 1.0  07/2017 - CMP, CT, Cr 1.2 - New left hydro to distal ureter.    2 - Left Hydronephrosis - NEW left hydro to distal ureter / conduit anastomosis area by ER CT 07/2017 on eval transient flan, pain.   PMH sig for cataract surgery, GIST (found incidentally on bladder caner surveillance, elected non-treatment). NO CV disease. No strong blood thinners. At baseline she lives independently and drives but has significant help from friends / neighbors. Her PCP is Deland Pretty MD.    Today " [name] " is seen to proceed with LEFT antegrade ureteroscoyp to r/o new stricture / cancer recurrence in evaluation of left hydro. NO interval fevers.    Past Medical History:  Diagnosis Date  . Barrett's esophagus   . Bladder cancer ([place]) 04/30/2014  . Dysrhythmia    occasional skip   . Gastric mass   . 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 Hypertension   . Hypothyroidism   . Memory disorder 08/30/2017  . Skin cancer    ON FACE    Past Surgical History:  Procedure Laterality Date  . CATARACT EXTRACTION, BILATERAL    . COLONOSCOPY    . CYSTOSCOPY N/A 04/30/2014   Procedure: CYSTOSCOPY;  Surgeon: Irine Seal, MD;  [facility];  Service: Urology;  Laterality: N/A;  . CYSTOSCOPY WITH  INJECTION N/A 06/26/2014   Procedure: CYSTOSCOPY WITH INJECTION OF INDOCYANINE GREEN DYE;  Surgeon: Alexis Frock, MD;  [facility];  Service: Urology;  Laterality: N/A;  . EUS N/A 09/28/2016   Procedure: UPPER ENDOSCOPIC ULTRASOUND (EUS) LINEAR;  Surgeon: [name], MD;  [facility];  Service: Endoscopy;  Laterality: N/A;  . hx of skin cancer removal     locally done   . ROBOT ASSISTED LAPAROSCOPIC COMPLETE CYSTECT ILEAL CONDUIT N/A 06/26/2014   Procedure: ROBOTIC ASSISTED LAPAROSCOPIC COMPLETE CYSTECT ILEAL CONDUIT;  Surgeon: Alexis Frock, MD;  [facility];  Service: Urology;  Laterality: N/A;  . ROBOTIC ASSISTED LAPAROSCOPIC HYSTERECTOMY AND SALPINGECTOMY N/A 06/26/2014   Procedure: ROBOTIC ASSISTED LAPAROSCOPIC HYSTERECTOMY AND BILATERAL SALPINGO-OPHERECTOMY;  Surgeon: Alexis Frock, MD;  [facility];  Service: Urology;  Laterality: N/A;  . TRANSURETHRAL RESECTION OF BLADDER TUMOR N/A 04/30/2014   Procedure: TRANSURETHRAL RESECTION OF BLADDER TUMOR (TURBT);  Surgeon: Irine Seal, MD;  [facility];  Service: Urology;  Laterality: N/A;  . UPPER GASTROINTESTINAL ENDOSCOPY      Family History  Problem Relation Age of Onset  . Stroke Maternal Aunt   . Hypertension Mother   . Bladder Cancer Neg Hx   . Stomach cancer Neg Hx   . Colon cancer Neg Hx   . Esophageal cancer Neg Hx   . Rectal cancer Neg Hx    Social History:  reports that she has quit smoking. Her smoking use included cigarettes. She smoked 0.50 packs per day. She has never used smokeless tobacco. She reports that she drinks alcohol. She reports that she does not use drugs.  Allergies:  Allergies  Allergen Reactions  . Diovan [Valsartan] Itching  . Codeine Nausea And Vomiting    No medications prior to admission.    No results found for this or any previous visit (from the past 48 hour(s)). No results found.  Review of Systems  Constitutional: Negative.  Negative for chills and  fever.  HENT: Negative.   Eyes: Negative.   Respiratory: Negative.   Cardiovascular: Negative.   Gastrointestinal: Negative.   Genitourinary: Negative.   Musculoskeletal: Negative.   Skin: Negative.   Neurological: Negative.   Endo/Heme/Allergies: Negative.   Psychiatric/Behavioral: Negative.     There were no vitals taken for this visit. Physical Exam  Constitutional: She appears well-developed.  VERY spry for age  HENT:  Head: Normocephalic.  Eyes: Pupils are equal, round, and reactive to light.  Neck: Normal range of motion.  Cardiovascular: Normal rate.  Respiratory: Effort normal.  GI: Soft.  Genitourinary:  Genitourinary Comments: RLQ urostomy in place. Left sided nephroureteral stent capped.   Neurological: She is alert.  Skin: Skin is warm.  Psychiatric: She has a normal mood and affect.     Assessment/Plan  Proceed as planned with LEFT antegrade ureteroscopy possible dialation / biopsy. Risks, benefits, expected peri-op course discussed previously and reiterated today.   Alexis Frock, MD 09/07/2017, 7:46 AM

## 2017-09-08 ENCOUNTER — Encounter (HOSPITAL_COMMUNITY): Payer: Self-pay | Admitting: Urology

## 2017-09-08 DIAGNOSIS — I10 Essential (primary) hypertension: Secondary | ICD-10-CM | POA: Diagnosis not present

## 2017-09-08 DIAGNOSIS — R531 Weakness: Secondary | ICD-10-CM | POA: Diagnosis not present

## 2017-09-08 DIAGNOSIS — K2270 Barrett's esophagus without dysplasia: Secondary | ICD-10-CM | POA: Diagnosis not present

## 2017-09-08 DIAGNOSIS — R262 Difficulty in walking, not elsewhere classified: Secondary | ICD-10-CM | POA: Diagnosis not present

## 2017-09-08 DIAGNOSIS — E039 Hypothyroidism, unspecified: Secondary | ICD-10-CM | POA: Diagnosis not present

## 2017-09-08 DIAGNOSIS — N1330 Unspecified hydronephrosis: Secondary | ICD-10-CM | POA: Diagnosis not present

## 2017-09-08 DIAGNOSIS — N131 Hydronephrosis with ureteral stricture, not elsewhere classified: Secondary | ICD-10-CM | POA: Diagnosis not present

## 2017-09-08 NOTE — Discharge Summary (Signed)
  Physician Discharge Summary      Patient ID: Victoria Patrick MRN: [id number] DOB/AGE: 82/20/1924 82 y.o.  Admit date: 09/07/2017 Discharge date: 09/08/2017  Admission Diagnoses: LEFT HYDRONEPHROSIS  Discharge Diagnoses:  Active Problems:   Hydronephrosis of left kidney   Discharged Condition: good  Hospital Course: She was found to have left hydronephrosis due to a partial left ureteral/conduit stricture that was balloon dilated yesterday.  She has a nephroureteral stent in place but is not having any flank pain at this time.  She has clear urine, no nausea or vomiting and tolerating a regular diet and is felt ready for discharge at this time.  Discharge Exam: Blood pressure 138/68, pulse 67, temperature (!) 97.3 F (36.3 C), resp. rate 16, height 5' 7.75" (1.721 m), weight 56.7 kg, SpO2 100 %. General: Awake, alert and in no apparent distress. Chest: Normal respiratory effort. Cardiovascular: Regular rate and rhythm. Abdomen: Soft, nontender, nondistended.  Ostomy is pink and viable with clear urine.   Disposition: Discharge disposition: 01-Home or Self Care       Discharge Instructions    Discharge patient   Complete by:  As directed    Discharge disposition:  01-Home or Self Care   Discharge patient date:  09/08/2017     Allergies as of 09/08/2017      Reactions   Diovan [valsartan] Itching   Codeine Nausea And Vomiting      Medication List    TAKE these medications   CALCIUM CITRATE PO Take 630 mg by mouth daily.   ciprofloxacin 500 MG tablet Commonly known as:  CIPRO Take 1 tablet (500 mg total) by mouth 2 (two) times daily. X 3 days. Begin day before next Urology appointment.   losartan 100 MG tablet Commonly known as:  COZAAR Take 1 tablet (100 mg total) by mouth daily.   multivitamin with minerals Tabs tablet Take 1 tablet by mouth daily.   SYNTHROID 88 MCG tablet Generic drug:  levothyroxine Take 88 mcg by mouth daily before breakfast.     traMADol 50 MG tablet Commonly known as:  ULTRAM Take 1 tablet (50 mg total) by mouth every 6 (six) hours as needed for moderate pain or severe pain. Post-operatively   Vitamin B-12 5000 MCG Tbdp Take 5,000 mcg by mouth daily.      Follow-up Information    Alexis Frock, MD On 09/20/2017.   Specialty:  Urology Why:  at 10:15 AM for MD visit Contact information: [place] [address] [phone]           Signed: Claybon Jabs 09/08/2017, 8:51 AM

## 2017-09-08 NOTE — Clinical Social Work Note (Signed)
Clinical Social Work Assessment  Patient Details  Name: Victoria Patrick MRN: [id number] Date of Birth: 1922/11/17  Date of referral:  09/08/17               Reason for consult:  Discharge Planning                Permission sought to share information with:    Permission granted to share information::     Name::        Agency::     Relationship::     Contact Information:     Housing/Transportation Living arrangements for the past 2 months:  Single Family Home Source of Information:  Other (Comment Required)(Niece, Victoria Patrick) Patient Interpreter Needed:  None Criminal Activity/Legal Involvement Pertinent to Current Situation/Hospitalization:  No - Comment as needed Significant Relationships:  Friend, Neighbor, Other Family Members Lives with:  Self Do you feel safe going back to the place where you live?  No Need for family participation in patient care:  Yes (Comment)  Care giving concerns:  Patient lives alone with regular check-ins by neighbors. She has no nearby family. Patient ambulates and has been doing ADLs independently but her memory impairment has gotten increasingly significant over the past month. There is concern for her to discharge home without supervision due to memory.    Social Worker assessment / plan:  CSW spoke with patient's family member, Victoria Patrick, about discharge plan. Patient currently lives alone with neighbors and friends checking in on her throughout the day. Patient's family member, Victoria Patrick, lives in [place], [place] and patient has no other family nearby. Victoria Patrick reports that patient's memory impairment began 3 years ago with UTI onset and has worsened over time, although patient still managed well at home with regular check-ins. Over the last month, patient's mental status and memory has declined significantly. Victoria Patrick reports patient has been forgetting medication, to eat, and to pay bills. She needs constant reminders and is becoming too much for neighbors to care for. Patient  ambulates, baths, dresses, etc independently. No SNF recommendations at this time.   CSW suggested family begin looking at [facility]. Victoria Patrick reported that she has visited one ALF and will be visiting more soon. Patient is extremely against living in ALF.   CSW also provided information on [facility] to have additional community support while patient is still living at home.   Employment status:  Retired Forensic scientist:  Medicare PT Recommendations:  No Follow Up Information / Referral to community resources:  [facility]  Patient/Family's Response to care:  Patient's family is worried about patient returning home and know she needs more care than she can currently get at home. This admission has shown them that patient potentially needs ALF level care and they are looking into placement.   Patient/Family's Understanding of and Emotional Response to Diagnosis, Current Treatment, and Prognosis:  Patient does not understand current admission. She has significant memory impairment and does not remember having surgery, why she is in the hospital, etc. Family understands their current options and will be looking into more care for patient.   Emotional Assessment Appearance:    Attitude/Demeanor/Rapport:  Unable to Assess Affect (typically observed):  Unable to Assess Orientation:  Oriented to Self Alcohol / Substance use:  Not Applicable Psych involvement (Current and /or in the community):     Discharge Needs  Concerns to be addressed:  Care Coordination Readmission within the last 30 days:  No Current discharge risk:  Lives alone Barriers to Discharge:  No Barriers Identified(Concern for safety living alone.)   Pricilla Holm, LCSWA 09/08/2017, 5:02 PM

## 2017-09-08 NOTE — Evaluation (Signed)
Physical Therapy Evaluation Patient Details Name: RITI ROLLYSON MRN: [id number] DOB: 1922/05/18 Today's Date: 09/08/2017   History of Present Illness  Pt admitted with L hydronephrosis and is s/p dilating of stricture 09/07/17.  Pt with hx of bladder CA and memory disorder  Clinical Impression  Pt admitted as above and currently demonstrating ability to independently perform basic mobility tasks and ambulate in hall.  Pt has no current PT needs but there is concern of pt being home alone 2*  memory issues and self care needs following procedure based on pt requiring reminders of her procedure by family.    Follow Up Recommendations No PT follow up    Equipment Recommendations  None recommended by PT    Recommendations for Other Services       Precautions / Restrictions Precautions Precautions: Fall Restrictions Weight Bearing Restrictions: No      Mobility  Bed Mobility Overal bed mobility: Modified Independent Bed Mobility: Supine to Sit;Sit to Supine     Supine to sit: Modified independent (Device/Increase time) Sit to supine: Modified independent (Device/Increase time)   General bed mobility comments: pt unassisted supine<>sit  Transfers Overall transfer level: Needs assistance Equipment used: None Transfers: Sit to/from Stand Sit to Stand: Supervision         General transfer comment: cues for saftey awareness  Ambulation/Gait Ambulation/Gait assistance: Min guard;Supervision;Independent Gait Distance (Feet): 450 Feet Assistive device: None Gait Pattern/deviations: Step-through pattern;Shuffle Gait velocity: mod pace   General Gait Details: Pt with mild initial instability but no LOB.  Pt progressed to ambulating IND including stepping forward, backward, and sideways as well as reaching to pick things off floor.  Stairs            Wheelchair Mobility    Modified Rankin (Stroke Patients Only)       Balance Overall balance assessment:  Independent Sitting-balance support: No upper extremity supported;Feet supported Sitting balance-Leahy Scale: Normal     Standing balance support: No upper extremity supported Standing balance-Leahy Scale: Good                               Pertinent Vitals/Pain Pain Assessment: Faces Faces Pain Scale: Hurts little more Pain Location: back with transfer back to bed Pain Descriptors / Indicators: Sore;Grimacing Pain Intervention(s): Limited activity within patient's tolerance;Monitored during session;Premedicated before session    Home Living Family/patient expects to be discharged to:: Private residence Living Arrangements: Alone Available Help at Discharge: Friend(s) Type of Home: House       Home Layout: One level Home Equipment: Environmental consultant - 2 wheels;Cane - single point Additional Comments: Pt reports she has Friends who can assist prn but family is out of town    Prior Function Level of Independence: Independent         Comments: pt states she is still doing yard work      Journalist, newspaper        Extremity/Trunk Assessment   Upper Extremity Assessment Upper Extremity Assessment: Overall WFL for tasks assessed    Lower Extremity Assessment Lower Extremity Assessment: Overall WFL for tasks assessed       Communication   Communication: No difficulties;HOH  Cognition Arousal/Alertness: Awake/alert Behavior During Therapy: WFL for tasks assessed/performed Overall Cognitive Status: History of cognitive impairments - at baseline  General Comments: Pt with memory dificits prior to admit.  Family present and reminding pt of what she had done and why she had back pain.      General Comments      Exercises     Assessment/Plan    PT Assessment Patent does not need any further PT services  PT Problem List         PT Treatment Interventions      PT Goals (Current goals can be found in the Care Plan  section)       Frequency     Barriers to discharge        Co-evaluation               AM-PAC PT "6 Clicks" Daily Activity  Outcome Measure Difficulty turning over in bed (including adjusting bedclothes, sheets and blankets)?: A Little Difficulty moving from lying on back to sitting on the side of the bed? : A Little Difficulty sitting down on and standing up from a chair with arms (e.g., wheelchair, bedside commode, etc,.)?: A Little Help needed moving to and from a bed to chair (including a wheelchair)?: None Help needed walking in hospital room?: None Help needed climbing 3-5 steps with a railing? : None 6 Click Score: 21    End of Session Equipment Utilized During Treatment: Gait belt Activity Tolerance: Patient tolerated treatment well Patient left: in bed;with call bell/phone within reach;with family/visitor present Nurse Communication: Mobility status PT Visit Diagnosis: Difficulty in walking, not elsewhere classified (R26.2)    Time: 1440-1502 PT Time Calculation (min) (ACUTE ONLY): 22 min   Charges:   PT Evaluation $PT Eval Low Complexity: 1 Low          Pg [phone]   Rashod Gougeon 09/08/2017, 4:28 PM

## 2017-09-08 NOTE — Progress Notes (Signed)
Patient unable to discharge to home. No family members/friends available to assist at home. Pt has history of memory impairment. Niece will pick pt up in am.

## 2017-09-09 DIAGNOSIS — N1330 Unspecified hydronephrosis: Secondary | ICD-10-CM | POA: Diagnosis not present

## 2017-09-09 DIAGNOSIS — I10 Essential (primary) hypertension: Secondary | ICD-10-CM | POA: Diagnosis not present

## 2017-09-09 DIAGNOSIS — N131 Hydronephrosis with ureteral stricture, not elsewhere classified: Secondary | ICD-10-CM | POA: Diagnosis not present

## 2017-09-09 DIAGNOSIS — R262 Difficulty in walking, not elsewhere classified: Secondary | ICD-10-CM | POA: Diagnosis not present

## 2017-09-09 DIAGNOSIS — K2270 Barrett's esophagus without dysplasia: Secondary | ICD-10-CM | POA: Diagnosis not present

## 2017-09-09 DIAGNOSIS — E039 Hypothyroidism, unspecified: Secondary | ICD-10-CM | POA: Diagnosis not present

## 2017-09-09 DIAGNOSIS — R531 Weakness: Secondary | ICD-10-CM | POA: Diagnosis not present

## 2017-09-09 NOTE — Discharge Summary (Signed)
Physician Discharge Summary  Patient ID: Victoria Patrick MRN: [id number] DOB/AGE: 01-30-1922 84 y.o.  Admit date: 09/07/2017 Discharge date: 09/09/2017  Admission Diagnoses:  Discharge Diagnoses:  Active Problems:   Hydronephrosis of left kidney   Discharged Condition: good  Hospital Course:   1 - LEFT Hydronephrosis - pt underwent left antegrade ureteroscopy with balloon dialation of mild / partial ureteral -conduit strictrue on 09/07/17, the day of admission. Left nephroureteral stent (capped) left in place  2 - Disposition - pt live alone at baseline and takes care of nearly all ADL's with help from neighbors / friends / family. SW Eval agree OK for home, no SNF needs, though assisted living transition may be prudent long term. No needs from PT eval This was discussed with pt several times during admission and she is reluctant, but amenable.   By the AM of 09/09/17, she is ambulatory, tollerating PO intake, at baseline, and felt to be adequate for discharge.   Consults: PT, social work.   Significant Diagnostic Studies: as per above  Treatments: surgery: as per above  Discharge Exam: Blood pressure 138/68, pulse 67, temperature (!) 97.3 F (36.3 C), resp. rate 16, height 5' 7.75" (1.721 m), weight 56.7 kg, SpO2 100 %. General appearance: alert, cooperative and appears much younger than stated age, she is at baseline.  Eyes: negative Nose: Nares normal. Septum midline. Mucosa normal. No drainage or sinus tenderness. Throat: lips, mucosa, and tongue normal; teeth and gums normal Neck: supple, symmetrical, trachea midline Back: symmetric, no curvature. ROM normal. No CVA tenderness. Resp: non-labored on room air.  Cardio: Nl rate GI: soft, non-tender; bowel sounds normal; no masses,  no organomegaly Extremities: extremities normal, atraumatic, no cyanosis or edema Pulses: 2+ and symmetric Lymph nodes: Cervical, supraclavicular, and axillary nodes normal. Neurologic: Grossly  normal Incision/Wound: RLQ Urostomy pink  / patent with copious non-foul urine. Left nephroureteral stent capped, no flank eccymoses / hematomas.   Disposition: Discharge disposition: 01-Home or Self Care       Discharge Instructions    Discharge patient   Complete by:  As directed    Discharge disposition:  01-Home or Self Care   Discharge patient date:  09/08/2017     Allergies as of 09/09/2017      Reactions   Diovan [valsartan] Itching   Codeine Nausea And Vomiting      Medication List    TAKE these medications   CALCIUM CITRATE PO Take 630 mg by mouth daily.   ciprofloxacin 500 MG tablet Commonly known as:  CIPRO Take 1 tablet (500 mg total) by mouth 2 (two) times daily. X 3 days. Begin day before next Urology appointment.   losartan 100 MG tablet Commonly known as:  COZAAR Take 1 tablet (100 mg total) by mouth daily.   multivitamin with minerals Tabs tablet Take 1 tablet by mouth daily.   SYNTHROID 88 MCG tablet Generic drug:  levothyroxine Take 88 mcg by mouth daily before breakfast.   traMADol 50 MG tablet Commonly known as:  ULTRAM Take 1 tablet (50 mg total) by mouth every 6 (six) hours as needed for moderate pain or severe pain. Post-operatively   Vitamin B-12 5000 MCG Tbdp Take 5,000 mcg by mouth daily.      Follow-up Information    Alexis Frock, MD On 09/20/2017.   Specialty:  Urology Why:  at 10:15 AM for MD visit Contact information: [address] [address] [phone]  SignedAlexis Frock 09/09/2017, 8:17 AM

## 2017-09-09 NOTE — Discharge Instructions (Signed)
1 - You may have  bloody urine on / off x few days. This is normal.  2 - Left flank dressing can stay in place until next office visit. It will be removed at that time. If it comes off prior, it can simply be dressed with dry gauze and paper tape.   3 - Call MD or go to ER for fever >102, severe pain / nausea / vomiting not relieved by medications, or acute change in medical status

## 2017-09-09 NOTE — Plan of Care (Signed)
Discharge instructions reviewed with patient and her niece [name].  Went over all medications emphasizing patient is to take Cipro starting the day (09-19-17)  prior to her office visit with Dr. Tresa Moore.  Also discussed with patient and her niece that Dr. Tresa Moore wants the dressing to her left back to stay dry and in place until her visit with him on 09/20/17.  Patient and niece verbalize understanding of the above.  Patient transported to main entrance of hospital to be taken home by niece.

## 2017-09-20 DIAGNOSIS — C670 Malignant neoplasm of trigone of bladder: Secondary | ICD-10-CM | POA: Diagnosis not present

## 2017-09-20 DIAGNOSIS — N130 Hydronephrosis with ureteropelvic junction obstruction: Secondary | ICD-10-CM | POA: Diagnosis not present

## 2017-09-20 DIAGNOSIS — Z932 Ileostomy status: Secondary | ICD-10-CM | POA: Diagnosis not present

## 2017-10-04 DIAGNOSIS — Z932 Ileostomy status: Secondary | ICD-10-CM | POA: Diagnosis not present

## 2017-10-04 DIAGNOSIS — Z23 Encounter for immunization: Secondary | ICD-10-CM | POA: Diagnosis not present

## 2017-10-04 DIAGNOSIS — I10 Essential (primary) hypertension: Secondary | ICD-10-CM | POA: Diagnosis not present

## 2017-10-04 DIAGNOSIS — R413 Other amnesia: Secondary | ICD-10-CM | POA: Diagnosis not present

## 2017-10-04 DIAGNOSIS — E039 Hypothyroidism, unspecified: Secondary | ICD-10-CM | POA: Diagnosis not present

## 2017-10-08 DIAGNOSIS — H26491 Other secondary cataract, right eye: Secondary | ICD-10-CM | POA: Diagnosis not present

## 2017-10-08 DIAGNOSIS — H353211 Exudative age-related macular degeneration, right eye, with active choroidal neovascularization: Secondary | ICD-10-CM | POA: Diagnosis not present

## 2017-10-08 DIAGNOSIS — Z961 Presence of intraocular lens: Secondary | ICD-10-CM | POA: Diagnosis not present

## 2017-10-24 DIAGNOSIS — Z8551 Personal history of malignant neoplasm of bladder: Secondary | ICD-10-CM | POA: Diagnosis not present

## 2017-10-24 DIAGNOSIS — M545 Low back pain: Secondary | ICD-10-CM | POA: Diagnosis not present

## 2017-10-24 DIAGNOSIS — E039 Hypothyroidism, unspecified: Secondary | ICD-10-CM | POA: Diagnosis not present

## 2017-10-24 DIAGNOSIS — Z79899 Other long term (current) drug therapy: Secondary | ICD-10-CM | POA: Diagnosis not present

## 2017-10-24 DIAGNOSIS — R7303 Prediabetes: Secondary | ICD-10-CM | POA: Diagnosis not present

## 2017-10-24 DIAGNOSIS — Z436 Encounter for attention to other artificial openings of urinary tract: Secondary | ICD-10-CM | POA: Diagnosis not present

## 2017-10-24 DIAGNOSIS — R413 Other amnesia: Secondary | ICD-10-CM | POA: Diagnosis not present

## 2017-10-24 DIAGNOSIS — I10 Essential (primary) hypertension: Secondary | ICD-10-CM | POA: Diagnosis not present

## 2017-10-25 DIAGNOSIS — I10 Essential (primary) hypertension: Secondary | ICD-10-CM | POA: Diagnosis not present

## 2017-10-26 DIAGNOSIS — D696 Thrombocytopenia, unspecified: Secondary | ICD-10-CM | POA: Diagnosis not present

## 2017-10-30 DIAGNOSIS — R413 Other amnesia: Secondary | ICD-10-CM | POA: Diagnosis not present

## 2017-10-30 DIAGNOSIS — M545 Low back pain: Secondary | ICD-10-CM | POA: Diagnosis not present

## 2017-10-30 DIAGNOSIS — E039 Hypothyroidism, unspecified: Secondary | ICD-10-CM | POA: Diagnosis not present

## 2017-10-30 DIAGNOSIS — Z8551 Personal history of malignant neoplasm of bladder: Secondary | ICD-10-CM | POA: Diagnosis not present

## 2017-10-30 DIAGNOSIS — I10 Essential (primary) hypertension: Secondary | ICD-10-CM | POA: Diagnosis not present

## 2017-10-30 DIAGNOSIS — R7303 Prediabetes: Secondary | ICD-10-CM | POA: Diagnosis not present

## 2017-11-01 DIAGNOSIS — E039 Hypothyroidism, unspecified: Secondary | ICD-10-CM | POA: Diagnosis not present

## 2017-11-01 DIAGNOSIS — I10 Essential (primary) hypertension: Secondary | ICD-10-CM | POA: Diagnosis not present

## 2017-11-05 DIAGNOSIS — E039 Hypothyroidism, unspecified: Secondary | ICD-10-CM | POA: Diagnosis not present

## 2017-11-05 DIAGNOSIS — M545 Low back pain: Secondary | ICD-10-CM | POA: Diagnosis not present

## 2017-11-05 DIAGNOSIS — R413 Other amnesia: Secondary | ICD-10-CM | POA: Diagnosis not present

## 2017-11-05 DIAGNOSIS — R7303 Prediabetes: Secondary | ICD-10-CM | POA: Diagnosis not present

## 2017-11-05 DIAGNOSIS — I10 Essential (primary) hypertension: Secondary | ICD-10-CM | POA: Diagnosis not present

## 2017-11-05 DIAGNOSIS — Z8551 Personal history of malignant neoplasm of bladder: Secondary | ICD-10-CM | POA: Diagnosis not present

## 2017-11-09 DIAGNOSIS — M545 Low back pain: Secondary | ICD-10-CM | POA: Diagnosis not present

## 2017-11-09 DIAGNOSIS — Z8551 Personal history of malignant neoplasm of bladder: Secondary | ICD-10-CM | POA: Diagnosis not present

## 2017-11-09 DIAGNOSIS — I10 Essential (primary) hypertension: Secondary | ICD-10-CM | POA: Diagnosis not present

## 2017-11-09 DIAGNOSIS — E039 Hypothyroidism, unspecified: Secondary | ICD-10-CM | POA: Diagnosis not present

## 2017-11-09 DIAGNOSIS — R7303 Prediabetes: Secondary | ICD-10-CM | POA: Diagnosis not present

## 2017-11-09 DIAGNOSIS — R413 Other amnesia: Secondary | ICD-10-CM | POA: Diagnosis not present

## 2017-11-14 DIAGNOSIS — I10 Essential (primary) hypertension: Secondary | ICD-10-CM | POA: Diagnosis not present

## 2017-11-14 DIAGNOSIS — R413 Other amnesia: Secondary | ICD-10-CM | POA: Diagnosis not present

## 2017-11-14 DIAGNOSIS — R7303 Prediabetes: Secondary | ICD-10-CM | POA: Diagnosis not present

## 2017-11-14 DIAGNOSIS — E039 Hypothyroidism, unspecified: Secondary | ICD-10-CM | POA: Diagnosis not present

## 2017-11-14 DIAGNOSIS — Z8551 Personal history of malignant neoplasm of bladder: Secondary | ICD-10-CM | POA: Diagnosis not present

## 2017-11-14 DIAGNOSIS — M545 Low back pain: Secondary | ICD-10-CM | POA: Diagnosis not present

## 2017-11-21 DIAGNOSIS — M545 Low back pain: Secondary | ICD-10-CM | POA: Diagnosis not present

## 2017-11-21 DIAGNOSIS — R7303 Prediabetes: Secondary | ICD-10-CM | POA: Diagnosis not present

## 2017-11-21 DIAGNOSIS — I10 Essential (primary) hypertension: Secondary | ICD-10-CM | POA: Diagnosis not present

## 2017-11-21 DIAGNOSIS — R413 Other amnesia: Secondary | ICD-10-CM | POA: Diagnosis not present

## 2017-11-21 DIAGNOSIS — E039 Hypothyroidism, unspecified: Secondary | ICD-10-CM | POA: Diagnosis not present

## 2017-11-21 DIAGNOSIS — Z8551 Personal history of malignant neoplasm of bladder: Secondary | ICD-10-CM | POA: Diagnosis not present

## 2017-12-05 DIAGNOSIS — M545 Low back pain: Secondary | ICD-10-CM | POA: Diagnosis not present

## 2017-12-05 DIAGNOSIS — E039 Hypothyroidism, unspecified: Secondary | ICD-10-CM | POA: Diagnosis not present

## 2017-12-05 DIAGNOSIS — R413 Other amnesia: Secondary | ICD-10-CM | POA: Diagnosis not present

## 2017-12-05 DIAGNOSIS — Z8551 Personal history of malignant neoplasm of bladder: Secondary | ICD-10-CM | POA: Diagnosis not present

## 2017-12-05 DIAGNOSIS — R7303 Prediabetes: Secondary | ICD-10-CM | POA: Diagnosis not present

## 2017-12-05 DIAGNOSIS — I10 Essential (primary) hypertension: Secondary | ICD-10-CM | POA: Diagnosis not present

## 2017-12-10 DIAGNOSIS — H353221 Exudative age-related macular degeneration, left eye, with active choroidal neovascularization: Secondary | ICD-10-CM | POA: Diagnosis not present

## 2017-12-10 DIAGNOSIS — H353212 Exudative age-related macular degeneration, right eye, with inactive choroidal neovascularization: Secondary | ICD-10-CM | POA: Diagnosis not present

## 2017-12-10 DIAGNOSIS — H353112 Nonexudative age-related macular degeneration, right eye, intermediate dry stage: Secondary | ICD-10-CM | POA: Diagnosis not present

## 2017-12-10 DIAGNOSIS — H353211 Exudative age-related macular degeneration, right eye, with active choroidal neovascularization: Secondary | ICD-10-CM | POA: Diagnosis not present

## 2017-12-19 DIAGNOSIS — R7303 Prediabetes: Secondary | ICD-10-CM | POA: Diagnosis not present

## 2017-12-19 DIAGNOSIS — Z8551 Personal history of malignant neoplasm of bladder: Secondary | ICD-10-CM | POA: Diagnosis not present

## 2017-12-19 DIAGNOSIS — I10 Essential (primary) hypertension: Secondary | ICD-10-CM | POA: Diagnosis not present

## 2017-12-19 DIAGNOSIS — E039 Hypothyroidism, unspecified: Secondary | ICD-10-CM | POA: Diagnosis not present

## 2017-12-19 DIAGNOSIS — R413 Other amnesia: Secondary | ICD-10-CM | POA: Diagnosis not present

## 2017-12-19 DIAGNOSIS — M545 Low back pain: Secondary | ICD-10-CM | POA: Diagnosis not present

## 2018-01-14 DIAGNOSIS — H353221 Exudative age-related macular degeneration, left eye, with active choroidal neovascularization: Secondary | ICD-10-CM | POA: Diagnosis not present

## 2018-02-25 DIAGNOSIS — H353211 Exudative age-related macular degeneration, right eye, with active choroidal neovascularization: Secondary | ICD-10-CM | POA: Diagnosis not present

## 2018-02-25 DIAGNOSIS — H353212 Exudative age-related macular degeneration, right eye, with inactive choroidal neovascularization: Secondary | ICD-10-CM | POA: Diagnosis not present

## 2018-02-25 DIAGNOSIS — H353122 Nonexudative age-related macular degeneration, left eye, intermediate dry stage: Secondary | ICD-10-CM | POA: Diagnosis not present

## 2018-02-25 DIAGNOSIS — H353221 Exudative age-related macular degeneration, left eye, with active choroidal neovascularization: Secondary | ICD-10-CM | POA: Diagnosis not present

## 2018-03-14 ENCOUNTER — Ambulatory Visit (INDEPENDENT_AMBULATORY_CARE_PROVIDER_SITE_OTHER): Payer: Medicare Other | Admitting: Neurology

## 2018-03-14 ENCOUNTER — Encounter: Payer: Self-pay | Admitting: Neurology

## 2018-03-14 VITALS — BP 143/76 | HR 76 | Ht 67.0 in | Wt 131.5 lb

## 2018-03-14 DIAGNOSIS — R413 Other amnesia: Secondary | ICD-10-CM

## 2018-03-14 NOTE — Progress Notes (Signed)
Reason for visit: Memory disturbance  Victoria Patrick is an 85 y.o. female  History of present illness:  Victoria Patrick is a 85 year old right-handed white female with a history of a progressive memory disturbance.  The patient was seen about 6 months ago, she has been able to maintain her weight since then, she has not wanted to go on any medications for memory.  She lives alone, but her sister and a neighbor may look in on her, she will occasionally miss a dose of medications.  She does have a pill dispenser that her sister loads once a week.  She operates a motor vehicle going short distances to the bank or to the grocery store, she cooks for herself.  She does her own finances and yard work.  Overall, she has been very healthy.  She returns to this office for an evaluation.  Her sister who accompanies her today indicates that she is repeating herself frequently.  There have been no definite safety issues with driving.  Past Medical History:  Diagnosis Date  . Barrett's esophagus   . Bladder cancer ([place]) 04/30/2014  . Dysrhythmia    occasional skip   . Gastric mass   . 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 Hypertension   . Hypothyroidism   . Memory disorder 08/30/2017  . Skin cancer    ON FACE    Past Surgical History:  Procedure Laterality Date  . CATARACT EXTRACTION, BILATERAL    . COLONOSCOPY    . CYSTOSCOPY N/A 04/30/2014   Procedure: CYSTOSCOPY;  Surgeon: Irine Seal, MD;  Location: [facility];  Service: Urology;  Laterality: N/A;  . CYSTOSCOPY WITH INJECTION N/A 06/26/2014   Procedure: CYSTOSCOPY WITH INJECTION OF INDOCYANINE GREEN DYE;  Surgeon: Alexis Frock, MD;  Location: [facility];  Service: Urology;  Laterality: N/A;  . EUS N/A 09/28/2016   Procedure: UPPER ENDOSCOPIC ULTRASOUND (EUS) LINEAR;  Surgeon: [name], MD;  Location: [facility];  Service: Endoscopy;  Laterality: N/A;  . hx of skin cancer removal     locally done   . IR URETERAL  STENT LEFT NEW ACCESS W/O SEP NEPHROSTOMY CATH  09/07/2017  . NEPHROLITHOTOMY Left 09/07/2017   Procedure: LEFT ANTEGRADE URETEROSCOPY, DILATING OF STRICTURE;  Surgeon: Alexis Frock, MD;  Location: [facility];  Service: Urology;  Laterality: Left;  . ROBOT ASSISTED LAPAROSCOPIC COMPLETE CYSTECT ILEAL CONDUIT N/A 06/26/2014   Procedure: ROBOTIC ASSISTED LAPAROSCOPIC COMPLETE CYSTECT ILEAL CONDUIT;  Surgeon: Alexis Frock, MD;  Location: [facility];  Service: Urology;  Laterality: N/A;  . ROBOTIC ASSISTED LAPAROSCOPIC HYSTERECTOMY AND SALPINGECTOMY N/A 06/26/2014   Procedure: ROBOTIC ASSISTED LAPAROSCOPIC HYSTERECTOMY AND BILATERAL SALPINGO-OPHERECTOMY;  Surgeon: Alexis Frock, MD;  Location: [facility];  Service: Urology;  Laterality: N/A;  . TRANSURETHRAL RESECTION OF BLADDER TUMOR N/A 04/30/2014   Procedure: TRANSURETHRAL RESECTION OF BLADDER TUMOR (TURBT);  Surgeon: Irine Seal, MD;  Location: [facility];  Service: Urology;  Laterality: N/A;  . UPPER GASTROINTESTINAL ENDOSCOPY      Family History  Problem Relation Age of Onset  . Stroke Maternal Aunt   . Hypertension Mother   . Bladder Cancer Neg Hx   . Stomach cancer Neg Hx   . Colon cancer Neg Hx   . Esophageal cancer Neg Hx   . Rectal cancer Neg Hx     Social history:  reports that she has quit smoking. Her smoking use included cigarettes. She smoked 0.50 packs per day. She has never  used smokeless tobacco. She reports current alcohol use. She reports that she does not use drugs.    Allergies  Allergen Reactions  . Diovan [Valsartan] Itching  . Codeine Nausea And Vomiting    Medications:  Prior to Admission medications   Medication Sig Start Date End Date Taking? Authorizing Provider  CALCIUM CITRATE PO Take 630 mg by mouth daily.   Yes [provider]  Cyanocobalamin (VITAMIN B-12) 5000 MCG TBDP Take 5,000 mcg by mouth daily.   Yes [provider]  losartan (COZAAR) 100 MG tablet Take 1 tablet (100 mg total) by mouth  daily. 09/03/14  Yes Rai, Ripudeep K, MD  Multiple Vitamin (MULTIVITAMIN WITH MINERALS) TABS tablet Take 1 tablet by mouth daily.   Yes [provider]  SYNTHROID 88 MCG tablet Take 88 mcg by mouth daily before breakfast.  03/19/13  Yes [provider]    ROS:  Out of a complete 14 system review of symptoms, the patient complains only of the following symptoms, and all other reviewed systems are negative.  Memory loss   Blood pressure (!) 143/76, pulse 76, height 5\' 7"  (1.702 m), weight 131 lb 8 oz (59.6 kg).  Physical Exam  General: The patient is alert and cooperative at the time of the examination.  Skin: No significant peripheral edema is noted.   Neurologic Exam  Mental status: The patient is alert and oriented x 3 at the time of the examination. The Mini-Mental status examination done today shows a total score of 21/30.  The patient is able to name 6 four-legged animals in 1 minute.   Cranial nerves: Facial symmetry is present. Speech is normal, no aphasia or dysarthria is noted. Extraocular movements are full. Visual fields are full.  Motor: The patient has good strength in all 4 extremities.  Sensory examination: Soft touch sensation is symmetric on the face, arms, and legs.  Coordination: The patient has good finger-nose-finger and heel-to-shin bilaterally.  Gait and station: The patient has a normal gait. Tandem gait is very minimally unstable. Romberg is negative. No drift is seen.  Reflexes: Deep tendon reflexes are symmetric.   Assessment/Plan:  1.  Memory disturbance  The patient is still living alone, driving a car.  The operation of her motor vehicle needs to be scrutinized very closely, it may be very soon in the near future that she will have to give up driving.  The patient is requiring some assistance keeping up with her medications at this time.  She will follow-up here in 6 months, she does not wish to go on any medications for memory.   She perceives that she does not have a memory problem.  Jill Alexanders MD 03/14/2018 11:09 AM  [facility] [address] [place], [place] [address]  Phone [phone] Fax [phone]   Greater than 50% of the visit was spent in counseling and coordination of care.  Face-to-face time with the patient was 20 minutes.

## 2018-03-21 DIAGNOSIS — C670 Malignant neoplasm of trigone of bladder: Secondary | ICD-10-CM | POA: Diagnosis not present

## 2018-03-21 DIAGNOSIS — N130 Hydronephrosis with ureteropelvic junction obstruction: Secondary | ICD-10-CM | POA: Diagnosis not present

## 2018-04-10 ENCOUNTER — Telehealth: Payer: Self-pay | Admitting: Neurology

## 2018-04-10 NOTE — Telephone Encounter (Signed)
Pt's sister in law(on [place]) has called to ask for a call from RN to discuss pt daily declining.  Mrs Tyndall(siser in law) states pt has declined to a point they are considering having pt taken out of home and put into a skilled nursing facility.  Mrs Jamelle Haring is asking for a call to discuss concerns about pt

## 2018-04-10 NOTE — Telephone Encounter (Signed)
I contacted pt's sister in law and poa. She wanted to advised Dr. Jannifer Franklin the pt's memory has severely declined and she is having increased difficulty taking care of herself.  Sister in [place] wanted to verify what recommendations were recommended at the last o/v. I advised medication for memory has been recommended but the pt declined to take any medications. She verbalized understanding and states she is going to look into a long term care cent for the pt.

## 2018-04-10 NOTE — Telephone Encounter (Signed)
The patient was seen here only about 1 month ago, she was driving a car at that point, if there has been a drastic change in her cognitive capacity, the first thing to do is get a full medical evaluation to exclude pneumonia or bladder infection or some other medical problem that is causing a severe change in cognitive functioning.  I left a message with Victoria Patrick.

## 2018-04-11 NOTE — Telephone Encounter (Signed)
[name] called back and wanted to inform us that she has received the message and that she really appreciates it. She has called the pt's Urologist Dr. Tresa Moore and they have put the pt on antibiotics for 4 days.

## 2018-04-15 DIAGNOSIS — H353212 Exudative age-related macular degeneration, right eye, with inactive choroidal neovascularization: Secondary | ICD-10-CM | POA: Diagnosis not present

## 2018-04-15 DIAGNOSIS — H353211 Exudative age-related macular degeneration, right eye, with active choroidal neovascularization: Secondary | ICD-10-CM | POA: Diagnosis not present

## 2018-04-15 DIAGNOSIS — H353112 Nonexudative age-related macular degeneration, right eye, intermediate dry stage: Secondary | ICD-10-CM | POA: Diagnosis not present

## 2018-04-15 DIAGNOSIS — H353221 Exudative age-related macular degeneration, left eye, with active choroidal neovascularization: Secondary | ICD-10-CM | POA: Diagnosis not present

## 2018-05-06 NOTE — Telephone Encounter (Signed)
I called the patient.  The patient is now willing to come in for blood work or have a CT scan at the moment.  The family members indicate that she is having progressive problems with memory, she is not driving a car currently.  I have indicated that they are to make plans for the future, the patient may not be able to live alone for much longer.

## 2018-05-06 NOTE — Telephone Encounter (Signed)
Pt sister in law (on [place]) has called to inform that pt is declining and recent medication has not helped.  Ms Dyann Ruddle is asking for a call from Dr Jannifer Franklin to discuss other options for pt

## 2018-05-07 ENCOUNTER — Other Ambulatory Visit: Payer: Self-pay

## 2018-05-07 ENCOUNTER — Other Ambulatory Visit: Payer: Self-pay | Admitting: Neurology

## 2018-05-07 ENCOUNTER — Other Ambulatory Visit (INDEPENDENT_AMBULATORY_CARE_PROVIDER_SITE_OTHER): Payer: Self-pay

## 2018-05-07 DIAGNOSIS — E538 Deficiency of other specified B group vitamins: Secondary | ICD-10-CM

## 2018-05-07 DIAGNOSIS — Z5181 Encounter for therapeutic drug level monitoring: Secondary | ICD-10-CM

## 2018-05-07 DIAGNOSIS — Z0289 Encounter for other administrative examinations: Secondary | ICD-10-CM

## 2018-05-07 NOTE — Telephone Encounter (Signed)
Patient reported to the clinic on 05/07/18 stating she needed blood test done.

## 2018-05-07 NOTE — Telephone Encounter (Signed)
The patient initially would not allow for the blood work to be done, she is now here in our office to have the blood work done.  I will go ahead and put in the orders.

## 2018-05-07 NOTE — Telephone Encounter (Signed)
I contacted [name] ok per dpr. She states pt's transportation (Jandy garcia) has been notified to bring the pt to our office for blood tests.  She wanted a note made for in the future for Donise Woodle to be contacted about appts and to encourage the pt to keep her appts.

## 2018-05-07 NOTE — Telephone Encounter (Signed)
Sister in law Harland Dingwall on [place] requesting call back regarding patient coming in for lab work. Please call her at [phone]

## 2018-05-08 ENCOUNTER — Telehealth: Payer: Self-pay

## 2018-05-08 LAB — CBC WITH DIFFERENTIAL/PLATELET
Basophils Absolute: 0 10*3/uL (ref 0.0–0.2)
Basos: 0 %
EOS (ABSOLUTE): 0.1 10*3/uL (ref 0.0–0.4)
Eos: 1 %
Hematocrit: 38.4 % (ref 34.0–46.6)
Hemoglobin: 12.7 g/dL (ref 11.1–15.9)
Immature Grans (Abs): 0 10*3/uL (ref 0.0–0.1)
Immature Granulocytes: 0 %
Lymphocytes Absolute: 1.2 10*3/uL (ref 0.7–3.1)
Lymphs: 15 %
MCH: 29.9 pg (ref 26.6–33.0)
MCHC: 33.1 g/dL (ref 31.5–35.7)
MCV: 90 fL (ref 79–97)
Monocytes Absolute: 0.7 10*3/uL (ref 0.1–0.9)
Monocytes: 9 %
Neutrophils Absolute: 5.9 10*3/uL (ref 1.4–7.0)
Neutrophils: 75 %
RBC: 4.25 x10E6/uL (ref 3.77–5.28)
RDW: 11.9 % (ref 11.7–15.4)
WBC: 7.9 10*3/uL (ref 3.4–10.8)

## 2018-05-08 LAB — COMPREHENSIVE METABOLIC PANEL
ALT: 13 IU/L (ref 0–32)
AST: 18 IU/L (ref 0–40)
Albumin/Globulin Ratio: 1.5 (ref 1.2–2.2)
Albumin: 4.2 g/dL (ref 3.5–4.6)
Alkaline Phosphatase: 75 IU/L (ref 39–117)
BUN/Creatinine Ratio: 17 (ref 12–28)
BUN: 20 mg/dL (ref 10–36)
Bilirubin Total: 0.4 mg/dL (ref 0.0–1.2)
CO2: 22 mmol/L (ref 20–29)
Calcium: 9.5 mg/dL (ref 8.7–10.3)
Chloride: 99 mmol/L (ref 96–106)
Creatinine, Ser: 1.18 mg/dL — ABNORMAL HIGH (ref 0.57–1.00)
GFR calc Af Amer: 46 mL/min/{1.73_m2} — ABNORMAL LOW (ref >59)
GFR calc non Af Amer: 40 mL/min/{1.73_m2} — ABNORMAL LOW (ref >59)
Globulin, Total: 2.8 g/dL (ref 1.5–4.5)
Glucose: 91 mg/dL (ref 65–99)
Potassium: 4.9 mmol/L (ref 3.5–5.2)
Sodium: 139 mmol/L (ref 134–144)
Total Protein: 7 g/dL (ref 6.0–8.5)

## 2018-05-08 LAB — SEDIMENTATION RATE: Sed Rate: 44 mm/hr — ABNORMAL HIGH (ref 0–40)

## 2018-05-08 LAB — VITAMIN B12: Vitamin B-12: >2000 pg/mL — ABNORMAL HIGH (ref 232–1245)

## 2018-05-08 NOTE — Telephone Encounter (Signed)
-----   Message from Kathrynn Ducking, MD sent at 05/08/2018  7:11 AM EDT ----- Blood work is unremarkable with exception of mild chronic renal insufficiency, unchanged from prior blood work.  Please call the patient. ----- Message ----- From: Lavone Neri Lab Results In Sent: 05/08/2018   5:36 AM EDT To: Kathrynn Ducking, MD

## 2018-05-08 NOTE — Telephone Encounter (Signed)
Patient returned my call and I was able to discuss lab results with her.  Patient verbalized understanding and had no questions.

## 2018-05-08 NOTE — Telephone Encounter (Signed)
Requested a call back from the patient so we could discuss her lab results.

## 2018-05-13 NOTE — Telephone Encounter (Signed)
I contacted the pt's daughter in law Victoria Patrick, ok per dpr) and was able to discuss labs. She verbalized understanding. She states they are in the process of looking at moving Victoria Patrick to a assisted living facility in [facility]. She states she will be in touch in regards to this move.

## 2018-05-13 NOTE — Telephone Encounter (Signed)
Victoria Patrick (dpr) called in to discuss lab results for pt  CB# [phone]

## 2018-05-27 DIAGNOSIS — I10 Essential (primary) hypertension: Secondary | ICD-10-CM | POA: Diagnosis not present

## 2018-05-27 DIAGNOSIS — E039 Hypothyroidism, unspecified: Secondary | ICD-10-CM | POA: Diagnosis not present

## 2018-05-27 DIAGNOSIS — D696 Thrombocytopenia, unspecified: Secondary | ICD-10-CM | POA: Diagnosis not present

## 2018-05-30 DIAGNOSIS — N183 Chronic kidney disease, stage 3 (moderate): Secondary | ICD-10-CM | POA: Diagnosis not present

## 2018-05-30 DIAGNOSIS — M8580 Other specified disorders of bone density and structure, unspecified site: Secondary | ICD-10-CM | POA: Diagnosis not present

## 2018-05-30 DIAGNOSIS — E039 Hypothyroidism, unspecified: Secondary | ICD-10-CM | POA: Diagnosis not present

## 2018-05-30 DIAGNOSIS — R413 Other amnesia: Secondary | ICD-10-CM | POA: Diagnosis not present

## 2018-05-30 DIAGNOSIS — I10 Essential (primary) hypertension: Secondary | ICD-10-CM | POA: Diagnosis not present

## 2018-05-30 DIAGNOSIS — Z936 Other artificial openings of urinary tract status: Secondary | ICD-10-CM | POA: Diagnosis not present

## 2018-05-30 DIAGNOSIS — R634 Abnormal weight loss: Secondary | ICD-10-CM | POA: Diagnosis not present

## 2018-05-30 NOTE — Telephone Encounter (Signed)
Victoria Patrick has called back from [place] to call her to discuss further the in home services available to pt.  She is no longer moving to [facility]. Please call

## 2018-05-30 NOTE — Telephone Encounter (Signed)
I contacted the pt's HPOW [name]). She states the pt will not be moving to [facility] and she wanted to know the names of home health agencies she could contact about getting home health services set up. I gave [facility] home [facility] and [facility] home health service name and [name] is going to look into these services further let us know what is needed.

## 2018-06-10 DIAGNOSIS — H353212 Exudative age-related macular degeneration, right eye, with inactive choroidal neovascularization: Secondary | ICD-10-CM | POA: Diagnosis not present

## 2018-06-10 DIAGNOSIS — H353211 Exudative age-related macular degeneration, right eye, with active choroidal neovascularization: Secondary | ICD-10-CM | POA: Diagnosis not present

## 2018-06-10 DIAGNOSIS — H353112 Nonexudative age-related macular degeneration, right eye, intermediate dry stage: Secondary | ICD-10-CM | POA: Diagnosis not present

## 2018-06-10 DIAGNOSIS — H353221 Exudative age-related macular degeneration, left eye, with active choroidal neovascularization: Secondary | ICD-10-CM | POA: Diagnosis not present

## 2018-06-17 ENCOUNTER — Inpatient Hospital Stay (HOSPITAL_COMMUNITY)
Admission: EM | Admit: 2018-06-17 | Discharge: 2018-06-22 | DRG: 312 | Disposition: A | Discharge status: Skilled Nursing Facility | Payer: Medicare Other | Attending: Internal Medicine | Admitting: Internal Medicine

## 2018-06-17 ENCOUNTER — Emergency Department (HOSPITAL_COMMUNITY): Payer: Medicare Other

## 2018-06-17 ENCOUNTER — Other Ambulatory Visit: Payer: Self-pay

## 2018-06-17 DIAGNOSIS — D72829 Elevated white blood cell count, unspecified: Secondary | ICD-10-CM | POA: Diagnosis present

## 2018-06-17 DIAGNOSIS — N390 Urinary tract infection, site not specified: Secondary | ICD-10-CM | POA: Diagnosis not present

## 2018-06-17 DIAGNOSIS — R413 Other amnesia: Secondary | ICD-10-CM | POA: Diagnosis present

## 2018-06-17 DIAGNOSIS — I959 Hypotension, unspecified: Secondary | ICD-10-CM | POA: Diagnosis not present

## 2018-06-17 DIAGNOSIS — R0981 Nasal congestion: Secondary | ICD-10-CM | POA: Diagnosis not present

## 2018-06-17 DIAGNOSIS — R4189 Other symptoms and signs involving cognitive functions and awareness: Secondary | ICD-10-CM | POA: Diagnosis present

## 2018-06-17 DIAGNOSIS — Z87891 Personal history of nicotine dependence: Secondary | ICD-10-CM

## 2018-06-17 DIAGNOSIS — D72825 Bandemia: Secondary | ICD-10-CM

## 2018-06-17 DIAGNOSIS — I10 Essential (primary) hypertension: Secondary | ICD-10-CM | POA: Diagnosis present

## 2018-06-17 DIAGNOSIS — F0390 Unspecified dementia without behavioral disturbance: Secondary | ICD-10-CM | POA: Diagnosis not present

## 2018-06-17 DIAGNOSIS — Z66 Do not resuscitate: Secondary | ICD-10-CM | POA: Diagnosis present

## 2018-06-17 DIAGNOSIS — N179 Acute kidney failure, unspecified: Secondary | ICD-10-CM | POA: Diagnosis present

## 2018-06-17 DIAGNOSIS — R52 Pain, unspecified: Secondary | ICD-10-CM | POA: Diagnosis not present

## 2018-06-17 DIAGNOSIS — Z85828 Personal history of other malignant neoplasm of skin: Secondary | ICD-10-CM

## 2018-06-17 DIAGNOSIS — M1611 Unilateral primary osteoarthritis, right hip: Secondary | ICD-10-CM | POA: Diagnosis not present

## 2018-06-17 DIAGNOSIS — K649 Unspecified hemorrhoids: Secondary | ICD-10-CM | POA: Diagnosis present

## 2018-06-17 DIAGNOSIS — S0990XA Unspecified injury of head, initial encounter: Secondary | ICD-10-CM | POA: Diagnosis not present

## 2018-06-17 DIAGNOSIS — B964 Proteus (mirabilis) (morganii) as the cause of diseases classified elsewhere: Secondary | ICD-10-CM | POA: Diagnosis present

## 2018-06-17 DIAGNOSIS — R531 Weakness: Secondary | ICD-10-CM

## 2018-06-17 DIAGNOSIS — E860 Dehydration: Secondary | ICD-10-CM | POA: Diagnosis present

## 2018-06-17 DIAGNOSIS — I951 Orthostatic hypotension: Principal | ICD-10-CM | POA: Diagnosis present

## 2018-06-17 DIAGNOSIS — R55 Syncope and collapse: Secondary | ICD-10-CM | POA: Diagnosis present

## 2018-06-17 DIAGNOSIS — S199XXA Unspecified injury of neck, initial encounter: Secondary | ICD-10-CM | POA: Diagnosis not present

## 2018-06-17 DIAGNOSIS — E039 Hypothyroidism, unspecified: Secondary | ICD-10-CM | POA: Diagnosis present

## 2018-06-17 DIAGNOSIS — W19XXXA Unspecified fall, initial encounter: Secondary | ICD-10-CM | POA: Diagnosis not present

## 2018-06-17 DIAGNOSIS — R410 Disorientation, unspecified: Secondary | ICD-10-CM | POA: Diagnosis not present

## 2018-06-17 DIAGNOSIS — K5900 Constipation, unspecified: Secondary | ICD-10-CM | POA: Diagnosis present

## 2018-06-17 DIAGNOSIS — Z8551 Personal history of malignant neoplasm of bladder: Secondary | ICD-10-CM

## 2018-06-17 DIAGNOSIS — R1030 Lower abdominal pain, unspecified: Secondary | ICD-10-CM | POA: Diagnosis not present

## 2018-06-17 DIAGNOSIS — C679 Malignant neoplasm of bladder, unspecified: Secondary | ICD-10-CM | POA: Diagnosis not present

## 2018-06-17 DIAGNOSIS — Z03818 Encounter for observation for suspected exposure to other biological agents ruled out: Secondary | ICD-10-CM | POA: Diagnosis not present

## 2018-06-17 DIAGNOSIS — Z1159 Encounter for screening for other viral diseases: Secondary | ICD-10-CM | POA: Diagnosis not present

## 2018-06-17 DIAGNOSIS — Z79890 Hormone replacement therapy (postmenopausal): Secondary | ICD-10-CM

## 2018-06-17 LAB — CBC WITH DIFFERENTIAL/PLATELET
Abs Immature Granulocytes: 0.09 10*3/uL — ABNORMAL HIGH (ref 0.00–0.07)
Basophils Absolute: 0 10*3/uL (ref 0.0–0.1)
Basophils Relative: 0 %
Eosinophils Absolute: 0 10*3/uL (ref 0.0–0.5)
Eosinophils Relative: 0 %
HCT: 38.4 % (ref 36.0–46.0)
Hemoglobin: 11.9 g/dL — ABNORMAL LOW (ref 12.0–15.0)
Immature Granulocytes: 1 %
Lymphocytes Relative: 5 %
Lymphs Abs: 0.6 10*3/uL — ABNORMAL LOW (ref 0.7–4.0)
MCH: 28.2 pg (ref 26.0–34.0)
MCHC: 31 g/dL (ref 30.0–36.0)
MCV: 91 fL (ref 80.0–100.0)
Monocytes Absolute: 0.9 10*3/uL (ref 0.1–1.0)
Monocytes Relative: 6 %
Neutro Abs: 11.9 10*3/uL — ABNORMAL HIGH (ref 1.7–7.7)
Neutrophils Relative %: 88 %
Platelets: UNDETERMINED 10*3/uL (ref 150–400)
RBC: 4.22 MIL/uL (ref 3.87–5.11)
RDW: 12.3 % (ref 11.5–15.5)
WBC: 13.6 10*3/uL — ABNORMAL HIGH (ref 4.0–10.5)
nRBC: 0 % (ref 0.0–0.2)

## 2018-06-17 LAB — SARS CORONAVIRUS 2 BY RT PCR (HOSPITAL ORDER, PERFORMED IN ~~LOC~~ HOSPITAL LAB): SARS Coronavirus 2: NEGATIVE

## 2018-06-17 LAB — COMPREHENSIVE METABOLIC PANEL
ALT: 11 U/L (ref 0–44)
AST: 16 U/L (ref 15–41)
Albumin: 3.9 g/dL (ref 3.5–5.0)
Alkaline Phosphatase: 81 U/L (ref 38–126)
Anion gap: 11 (ref 5–15)
BUN: 45 mg/dL — ABNORMAL HIGH (ref 8–23)
CO2: 26 mmol/L (ref 22–32)
Calcium: 10.3 mg/dL (ref 8.9–10.3)
Chloride: 101 mmol/L (ref 98–111)
Creatinine, Ser: 1.56 mg/dL — ABNORMAL HIGH (ref 0.44–1.00)
GFR calc Af Amer: 33 mL/min — ABNORMAL LOW (ref >60)
GFR calc non Af Amer: 28 mL/min — ABNORMAL LOW (ref >60)
Glucose, Bld: 127 mg/dL — ABNORMAL HIGH (ref 70–99)
Potassium: 4.7 mmol/L (ref 3.5–5.1)
Sodium: 138 mmol/L (ref 135–145)
Total Bilirubin: 0.9 mg/dL (ref 0.3–1.2)
Total Protein: 8 g/dL (ref 6.5–8.1)

## 2018-06-17 LAB — URINALYSIS, ROUTINE W REFLEX MICROSCOPIC
Bilirubin Urine: NEGATIVE
Glucose, UA: NEGATIVE mg/dL
Hgb urine dipstick: NEGATIVE
Ketones, ur: 5 mg/dL — AB
Leukocytes,Ua: NEGATIVE
Nitrite: NEGATIVE
Protein, ur: 100 mg/dL — AB
Specific Gravity, Urine: 1.013 (ref 1.005–1.030)
pH: 8 (ref 5.0–8.0)

## 2018-06-17 LAB — PROTIME-INR
INR: 1 (ref 0.8–1.2)
Prothrombin Time: 13.2 seconds (ref 11.4–15.2)

## 2018-06-17 LAB — SODIUM, URINE, RANDOM: Sodium, Ur: 48 mmol/L

## 2018-06-17 LAB — TROPONIN I
Troponin I: <0.03 ng/mL (ref <0.03)
Troponin I: <0.03 ng/mL (ref <0.03)

## 2018-06-17 LAB — MAGNESIUM: Magnesium: 2.3 mg/dL (ref 1.7–2.4)

## 2018-06-17 LAB — CREATININE, URINE, RANDOM: Creatinine, Urine: 125.8 mg/dL

## 2018-06-17 MED ORDER — ONDANSETRON HCL 4 MG/2ML IJ SOLN: 4.0000 mg | Freq: Four times a day (QID) | INTRAMUSCULAR | Status: DC | PRN

## 2018-06-17 MED ORDER — SENNA 8.6 MG PO TABS
1.0000 | ORAL_TABLET | Freq: Two times a day (BID) | ORAL | Status: DC
  Administered 2018-06-17 – 2018-06-22 (×10): 8.6 mg via ORAL
  Filled 2018-06-17 (×10): qty 1

## 2018-06-17 MED ORDER — ONDANSETRON HCL 4 MG PO TABS
4.0000 mg | ORAL_TABLET | Freq: Four times a day (QID) | ORAL | Status: DC | PRN
  Administered 2018-06-22: 4 mg via ORAL
  Filled 2018-06-17: qty 1

## 2018-06-17 MED ORDER — BISACODYL 10 MG RE SUPP: 10.0000 mg | Freq: Every day | RECTAL | Status: DC | PRN

## 2018-06-17 MED ORDER — MILK AND MOLASSES ENEMA
1.0000 | Freq: Once | RECTAL | Status: AC
  Administered 2018-06-18: 240 mL via RECTAL
  Filled 2018-06-17 (×2): qty 240

## 2018-06-17 MED ORDER — POLYETHYLENE GLYCOL 3350 17 G PO PACK: 17.0000 g | PACK | Freq: Every day | ORAL | Status: DC | PRN

## 2018-06-17 MED ORDER — ACETAMINOPHEN 325 MG PO TABS
650.0000 mg | ORAL_TABLET | Freq: Four times a day (QID) | ORAL | Status: DC | PRN
  Administered 2018-06-20 – 2018-06-21 (×2): 650 mg via ORAL
  Filled 2018-06-17 (×2): qty 2

## 2018-06-17 MED ORDER — ENOXAPARIN SODIUM 30 MG/0.3ML ~~LOC~~ SOLN
30.0000 mg | SUBCUTANEOUS | Status: DC
  Administered 2018-06-17 – 2018-06-20 (×3): 30 mg via SUBCUTANEOUS
  Filled 2018-06-17 (×5): qty 0.3

## 2018-06-17 MED ORDER — ACETAMINOPHEN 650 MG RE SUPP: 650.0000 mg | Freq: Four times a day (QID) | RECTAL | Status: DC | PRN

## 2018-06-17 MED ORDER — SODIUM CHLORIDE 0.9 % IV SOLN
INTRAVENOUS | Status: AC
  Administered 2018-06-17: 22:00:00 via INTRAVENOUS

## 2018-06-17 MED ORDER — SODIUM CHLORIDE 0.9 % IV SOLN
1.0000 g | INTRAVENOUS | Status: DC
  Administered 2018-06-17 – 2018-06-19 (×3): 1 g via INTRAVENOUS
  Filled 2018-06-17 (×2): qty 1
  Filled 2018-06-17 (×2): qty 10

## 2018-06-17 MED ORDER — LEVOTHYROXINE SODIUM 88 MCG PO TABS
88.0000 ug | ORAL_TABLET | Freq: Every day | ORAL | Status: DC
  Administered 2018-06-18 – 2018-06-22 (×5): 88 ug via ORAL
  Filled 2018-06-17 (×5): qty 1

## 2018-06-17 MED ORDER — SODIUM CHLORIDE 0.9 % IV BOLUS
500.0000 mL | Freq: Once | INTRAVENOUS | Status: AC
  Administered 2018-06-17: 500 mL via INTRAVENOUS

## 2018-06-17 NOTE — ED Notes (Signed)
Bed: WA20 Expected date:  Expected time:  Means of arrival:  Comments: EMS 85 yo syncope from [facility]

## 2018-06-17 NOTE — ED Notes (Addendum)
[phone] Patient's sister in law and POA in [place], [place]  She reports patient has been declining and having more difficulty walking. Pt's niece is concerned patient is unsafe to go home and she lives alone.

## 2018-06-17 NOTE — ED Notes (Signed)
Patient's sister in law made aware that the plan of care is to admit to the hospital.  Pt has been asking cyclic questioning and her sister in law reports worsening dementia and unsafe living by herself.

## 2018-06-17 NOTE — ED Notes (Signed)
othostatics delayed.  Pt sts her feet are too cold to stand on.

## 2018-06-17 NOTE — ED Provider Notes (Signed)
[facility] DEPT Provider Note   CSN: [id number] Arrival date & time: 06/17/18  1416    History   Chief Complaint Chief Complaint  Patient presents with  . Fall  . Loss of Consciousness    HPI Victoria Patrick is a 85 y.o. female.     HPI Patient was with a friend going to the [employer].  She reports that she felt well this morning.  She rode with her friend in the car.  They were waiting in line and she is not sure what happened but she just went to the ground.  She reports she just went down she does not member exactly how she hit.  The only pain area she has now with exam is in her left groin.  She denies any headache or neck pain.  He denies chest pain.  She did not perceive chest pain, palpitation or headache leading up to the episode.  Denies she has any extremity feels weak numb or tingly.  She reports now she just feels "bad".  She reports she did eat breakfast this morning.  She reports she does not think she went out completely.  She however admits that she does not exactly remember hitting the ground or waking up.  Patient lives home independently.  She denies being on a blood pressure medications.  She reports she does take medication for blood pressure.  Patient has a niece in [place] who is the power of attorney.  She has expressed some concerns to nursing staff about patient's safety at home due to mild dementia although she is functioning independently at this time. Past Medical History:  Diagnosis Date  . Barrett's esophagus   . Bladder cancer ([place]) 04/30/2014  . Dysrhythmia    occasional skip   . Gastric mass   . 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 Hypertension   . Hypothyroidism   . Memory disorder 08/30/2017  . Skin cancer    ON FACE    Patient Active Problem List   Diagnosis Date Noted  . Hydronephrosis of left kidney 09/07/2017  . Memory disorder 08/30/2017  . Gastric mass   .  Palliative care by specialist 09/02/2014  . DNR (do not resuscitate) discussion 09/02/2014  . Delirium 08/31/2014  . Weakness generalized 08/31/2014  . Hypercalcemia 08/31/2014  . UTI (urinary tract infection) 08/31/2014  . Essential hypertension 08/31/2014  . Hypothyroidism 08/31/2014  . Weakness 08/31/2014  . Bladder cancer ([place]) 04/30/2014    Past Surgical History:  Procedure Laterality Date  . CATARACT EXTRACTION, BILATERAL    . COLONOSCOPY    . CYSTOSCOPY N/A 04/30/2014   Procedure: CYSTOSCOPY;  Surgeon: Irine Seal, MD;  Location: [facility];  Service: Urology;  Laterality: N/A;  . CYSTOSCOPY WITH INJECTION N/A 06/26/2014   Procedure: CYSTOSCOPY WITH INJECTION OF INDOCYANINE GREEN DYE;  Surgeon: Alexis Frock, MD;  Location: [facility];  Service: Urology;  Laterality: N/A;  . EUS N/A 09/28/2016   Procedure: UPPER ENDOSCOPIC ULTRASOUND (EUS) LINEAR;  Surgeon: [name], MD;  Location: [facility];  Service: Endoscopy;  Laterality: N/A;  . hx of skin cancer removal     locally done   . IR URETERAL STENT LEFT NEW ACCESS W/O SEP NEPHROSTOMY CATH  09/07/2017  . NEPHROLITHOTOMY Left 09/07/2017   Procedure: LEFT ANTEGRADE URETEROSCOPY, DILATING OF STRICTURE;  Surgeon: Alexis Frock, MD;  Location: [facility];  Service: Urology;  Laterality: Left;  . ROBOT ASSISTED  LAPAROSCOPIC COMPLETE CYSTECT ILEAL CONDUIT N/A 06/26/2014   Procedure: ROBOTIC ASSISTED LAPAROSCOPIC COMPLETE CYSTECT ILEAL CONDUIT;  Surgeon: Alexis Frock, MD;  Location: [facility];  Service: Urology;  Laterality: N/A;  . ROBOTIC ASSISTED LAPAROSCOPIC HYSTERECTOMY AND SALPINGECTOMY N/A 06/26/2014   Procedure: ROBOTIC ASSISTED LAPAROSCOPIC HYSTERECTOMY AND BILATERAL SALPINGO-OPHERECTOMY;  Surgeon: Alexis Frock, MD;  Location: [facility];  Service: Urology;  Laterality: N/A;  . TRANSURETHRAL RESECTION OF BLADDER TUMOR N/A 04/30/2014   Procedure: TRANSURETHRAL RESECTION OF BLADDER TUMOR (TURBT);  Surgeon: Irine Seal, MD;  Location: [facility];   Service: Urology;  Laterality: N/A;  . UPPER GASTROINTESTINAL ENDOSCOPY       OB History   No obstetric history on file.      Home Medications    Prior to Admission medications   Medication Sig Start Date End Date Taking? Authorizing Provider  CALCIUM CITRATE PO Take 630 mg by mouth daily.    [provider]  Cyanocobalamin (VITAMIN B-12) 5000 MCG TBDP Take 5,000 mcg by mouth daily.    [provider]  losartan (COZAAR) 100 MG tablet Take 1 tablet (100 mg total) by mouth daily. 09/03/14   Rai, Vernelle Emerald, MD  Multiple Vitamin (MULTIVITAMIN WITH MINERALS) TABS tablet Take 1 tablet by mouth daily.    [provider]  SYNTHROID 88 MCG tablet Take 88 mcg by mouth daily before breakfast.  03/19/13   [provider]    Family History Family History  Problem Relation Age of Onset  . Stroke Maternal Aunt   . Hypertension Mother   . Bladder Cancer Neg Hx   . Stomach cancer Neg Hx   . Colon cancer Neg Hx   . Esophageal cancer Neg Hx   . Rectal cancer Neg Hx     Social History Social History   Tobacco Use  . Smoking status: Former Smoker    Packs/day: 0.50    Types: Cigarettes  . Smokeless tobacco: Never Used  . Tobacco comment: quit 30 yrs ago  Substance Use Topics  . Alcohol use: Yes    Comment: no wine lately  . Drug use: No     Allergies   Diovan [valsartan] and Codeine   Review of Systems Review of Systems 10 Systems reviewed and are negative for acute change except as noted in the HPI.  Physical Exam Updated Vital Signs BP 102/81   Pulse 78   Temp 98 F (36.7 C) (Oral)   Resp 18   SpO2 97%   Physical Exam Constitutional:      Appearance: She is well-developed.     Comments: Patient is alert and in excellent physical condition for age.  Ental status seems sharp.  HENT:     Head: Normocephalic and atraumatic.     Nose: Nose normal.     Mouth/Throat:     Mouth: Mucous membranes are moist.     Pharynx: Oropharynx is  clear.  Eyes:     Extraocular Movements: Extraocular movements intact.     Pupils: Pupils are equal, round, and reactive to light.  Neck:     Musculoskeletal: Neck supple.     Comments: Patient does not endorse focal C-spine tenderness to palpation. Cardiovascular:     Rate and Rhythm: Normal rate and regular rhythm.     Heart sounds: Normal heart sounds.  Pulmonary:     Effort: Pulmonary effort is normal.     Breath sounds: Normal breath sounds.  Chest:     Chest wall: No tenderness.  Abdominal:     General: Bowel sounds are normal. There is no distension.     Palpations: Abdomen is soft.     Tenderness: There is no abdominal tenderness.     Comments: Patient has urostomy bag in place.  Musculoskeletal: Normal range of motion.        General: No swelling.     Comments: Patient endorses discomfort in the left groin area with straight leg raise on the right and the left.  She has no range of motion pain or difficulty with upper extremities.  Lower extremities are well formed with good musculature and very good skin condition.  Pain is moderately elicited by flexion at the left hip.  Patient denied any pain to palpation of the spine.  Skin:    General: Skin is warm and dry.  Neurological:     Mental Status: She is alert and oriented to person, place, and time.     GCS: GCS eye subscore is 4. GCS verbal subscore is 5. GCS motor subscore is 6.     Coordination: Coordination normal.  Psychiatric:        Mood and Affect: Mood normal.      ED Treatments / Results  Labs (all labs ordered are listed, but only abnormal results are displayed) Labs Reviewed  COMPREHENSIVE METABOLIC PANEL - Abnormal; Notable for the following components:      Result Value   Glucose, Bld 127 (*)    BUN 45 (*)    Creatinine, Ser 1.56 (*)    GFR calc non Af Amer 28 (*)    GFR calc Af Amer 33 (*)    All other components within normal limits  CBC WITH DIFFERENTIAL/PLATELET - Abnormal; Notable for the  following components:   WBC 13.6 (*)    Hemoglobin 11.9 (*)    Neutro Abs 11.9 (*)    Lymphs Abs 0.6 (*)    Abs Immature Granulocytes 0.09 (*)    All other components within normal limits  URINE CULTURE  TROPONIN I  PROTIME-INR  URINALYSIS, ROUTINE W REFLEX MICROSCOPIC    EKG EKG Interpretation  Date/Time:  Monday June 17 2018 14:32:58 EDT Ventricular Rate:  82 PR Interval:    QRS Duration: 109 QT Interval:  360 QTC Calculation: 421 R Axis:   -60 Text Interpretation:  Sinus rhythm Ventricular premature complex Left anterior fascicular block Probable left ventricular hypertrophy no change from previous Confirmed by Charlesetta Shanks [phone]) on 06/17/2018 5:07:34 PM   Radiology Dg Chest 2 View  Result Date: 06/17/2018 CLINICAL DATA:  Syncope. EXAM: CHEST - 2 VIEW COMPARISON:  Radiographs of August 09, 2016. FINDINGS: The heart size and mediastinal contours are within normal limits. Both lungs are clear. No pneumothorax or pleural effusion is noted. The visualized skeletal structures are unremarkable. IMPRESSION: No active cardiopulmonary disease. Aortic Atherosclerosis (ICD10-I70.0). Electronically Signed   By: Marijo Conception M.D.   On: 06/17/2018 16:25   Dg Pelvis 1-2 Views  Result Date: 06/17/2018 CLINICAL DATA:  Syncope.  Fall. EXAM: PELVIS - 1-2 VIEW COMPARISON:  None. FINDINGS: There is no evidence of pelvic fracture or diastasis. Moderate narrowing of the right hip joint is noted. No pelvic bone lesions are seen. IMPRESSION: Moderate osteoarthritis of the right hip. No acute abnormality seen in the pelvis. Electronically Signed   By: Marijo Conception M.D.   On: 06/17/2018 16:27   Ct Head Wo Contrast  Result Date: 06/17/2018 CLINICAL DATA:  Syncopal episode, fall, confusion EXAM: CT  HEAD WITHOUT CONTRAST CT CERVICAL SPINE WITHOUT CONTRAST TECHNIQUE: Multidetector CT imaging of the head and cervical spine was performed following the standard protocol without intravenous contrast.  Multiplanar CT image reconstructions of the cervical spine were also generated. COMPARISON:  Prior CT scan of the head 08/31/2014 FINDINGS: CT HEAD FINDINGS Brain: No evidence of acute infarction, hemorrhage, hydrocephalus, extra-axial collection or mass lesion/mass effect. Cerebral and cerebellar cortical atrophy. Moderate periventricular white matter hypoattenuation most consistent with chronic microvascular ischemic white matter disease. Vascular: No hyperdense vessel or unexpected calcification. Skull: Normal. Negative for fracture or focal lesion. Sinuses/Orbits: No acute finding. Other: None. CT CERVICAL SPINE FINDINGS Alignment: Straightening of the normal cervical lordosis. Skull base and vertebrae: No evidence of acute fracture or malalignment Soft tissues and spinal canal: No prevertebral fluid or swelling. No visible canal hematoma. Disc levels: Cervical spondylosis most notable at C5-C6 and C6-C7. Multilevel anterolisthesis most significant at 2-3 mm of C2 on C3 and 3 mm of C3 on C4. Left-sided facet arthropathy and ankylosis of the posterior elements at C2-C3. Upper chest: Negative. Other: None IMPRESSION: CT HEAD 1. No acute intracranial abnormality. 2. Atrophy and chronic microvascular ischemic white matter disease. CT CSPINE 1. No acute fracture or malalignment. 2. Multilevel cervical spondylosis. Electronically Signed   By: Jacqulynn Cadet M.D.   On: 06/17/2018 16:38   Ct Cervical Spine Wo Contrast  Result Date: 06/17/2018 CLINICAL DATA:  Syncopal episode, fall, confusion EXAM: CT HEAD WITHOUT CONTRAST CT CERVICAL SPINE WITHOUT CONTRAST TECHNIQUE: Multidetector CT imaging of the head and cervical spine was performed following the standard protocol without intravenous contrast. Multiplanar CT image reconstructions of the cervical spine were also generated. COMPARISON:  Prior CT scan of the head 08/31/2014 FINDINGS: CT HEAD FINDINGS Brain: No evidence of acute infarction, hemorrhage,  hydrocephalus, extra-axial collection or mass lesion/mass effect. Cerebral and cerebellar cortical atrophy. Moderate periventricular white matter hypoattenuation most consistent with chronic microvascular ischemic white matter disease. Vascular: No hyperdense vessel or unexpected calcification. Skull: Normal. Negative for fracture or focal lesion. Sinuses/Orbits: No acute finding. Other: None. CT CERVICAL SPINE FINDINGS Alignment: Straightening of the normal cervical lordosis. Skull base and vertebrae: No evidence of acute fracture or malalignment Soft tissues and spinal canal: No prevertebral fluid or swelling. No visible canal hematoma. Disc levels: Cervical spondylosis most notable at C5-C6 and C6-C7. Multilevel anterolisthesis most significant at 2-3 mm of C2 on C3 and 3 mm of C3 on C4. Left-sided facet arthropathy and ankylosis of the posterior elements at C2-C3. Upper chest: Negative. Other: None IMPRESSION: CT HEAD 1. No acute intracranial abnormality. 2. Atrophy and chronic microvascular ischemic white matter disease. CT CSPINE 1. No acute fracture or malalignment. 2. Multilevel cervical spondylosis. Electronically Signed   By: Jacqulynn Cadet M.D.   On: 06/17/2018 16:38    Procedures Procedures (including critical care time)  Medications Ordered in ED Medications  sodium chloride 0.9 % bolus 500 mL (500 mLs Intravenous New Bag/Given 06/17/18 1834)     Initial Impression / Assessment and Plan / ED Course  I have reviewed the triage vital signs and the nursing notes.  Pertinent labs & imaging results that were available during my care of the patient were reviewed by me and considered in my medical decision making (see chart for details).       Patient had a near syncope episode today.  She is unsure if she had complete loss of consciousness.  CT does not show any intracranial injury.  She does not endorse  neck pain or neurologic deficit.  Patient does report she feels generally poorly at  this time.  Patient does have mild AKI and leukocytosis.  Patient has a urostomy bag.  Possible UTI and dehydration.  Plan for observation.  Final Clinical Impressions(s) / ED Diagnoses   Final diagnoses:  Near syncope  Dehydration    ED Discharge Orders    None       Charlesetta Shanks, MD 06/17/18 1851

## 2018-06-17 NOTE — ED Notes (Signed)
Pt aware that a urine sample is needed.  Pt sts that she is unable at this time but will ring call bell when she is able.

## 2018-06-17 NOTE — H&P (Signed)
Victoria Patrick WPY:[id number] DOB: 13-Jun-1922 DOA: 06/17/2018     PCP: Deland Pretty, MD   Outpatient Specialists:  NONE   GI Dr. Ardis Hughs Urology Dr. Tresa Moore  Patient arrived to ER on 06/17/18 at 1416  Patient coming from: home Lives alone,     Chief Complaint:  Chief Complaint  Patient presents with   Fall   Loss of Consciousness    HPI: Victoria Patrick is a 85 y.o. female with medical history significant of mild dementia, B12 deficiency, bladder cancer sp urostomy, gastrointestinal stromal tumor, hypothyroidism, HTN    Presented with  Syncope while waiting in the line at [employer] She was standing in the line trying to order food when she had a syncopal episode no numbness no tingling no shortness of breath or chest pain associated with that.  Patient is not sure what happened there was a sudden occurrence. The last thing she remembers she was going to eat lunch at [employer] Sudden collapse to the ground.  No associated headache no chest pain no neck pain. She does report she overall just feels very bad.  She did eat breakfast this morning At baseline she walks lives at home independently and drives.  She does have mild dementia and occasionally forgets things but overall able to function. Her daughter-in-law was planning to move her to [place] and was arranging for assisted living facilities there.  She is unsure when she had her last BM   Infectious risk factors:  Reports none In RAPID COVID TEST NEGATIVE     Regarding pertinent Chronic problems:      HTN on Cozaar   Hypothyroidism:  Lab Results  Component Value Date   TSH 1.326 09/01/2014   on synthroid      While in ER: Work-up so far Leukocytosis and AKI  negative troponin no evidence of infiltrate on chest x-ray unremarkable EKG patient currently appears to be stable  The following Work up has been ordered so far:  Orders Placed This Encounter  Procedures   Urine culture   SARS Coronavirus 2 (CEPHEID -  Performed in [facility] hospital lab), Hosp Order   CT Head Wo Contrast   CT Cervical Spine Wo Contrast   DG Chest 2 View   DG Pelvis 1-2 Views   DG Abd 1 View   DG Abd 1 View   Comprehensive metabolic panel   Troponin I - Once   CBC with Differential   Protime-INR   Urinalysis, Routine w reflex microscopic   Troponin I - Now Then Q6H   Creatinine, urine, random   Sodium, urine, random   Magnesium   Diet regular Room service appropriate? Yes; Fluid consistency: Thin   Nursing communication   Orthostatic vital signs   Cardiac monitoring   Consult to hospitalist  ALL PATIENTS BEING ADMITTED/HAVING PROCEDURES NEED COVID-19 SCREENING   ED EKG   EKG 12-Lead   Saline lock IV   Place in observation (patient's expected length of stay will be less than 2 midnights)     Following Medications were ordered in ER: Medications  cefTRIAXone (ROCEPHIN) 1 g in sodium chloride 0.9 % 100 mL IVPB (1 g Intravenous New Bag/Given 06/17/18 2023)  milk and molasses enema (has no administration in time range)  sodium chloride 0.9 % bolus 500 mL (0 mLs Intravenous Stopped 06/17/18 2022)        Consult Orders  (From admission, onward)         Start  Ordered   06/17/18 1849  Consult to hospitalist  ALL PATIENTS BEING ADMITTED/HAVING PROCEDURES NEED COVID-19 SCREENING  Once    Comments:  ALL PATIENTS BEING ADMITTED/HAVING PROCEDURES NEED COVID-19 SCREENING  Provider:  (Not yet assigned)  Question Answer Comment  Place call to: [facility]   Reason for Consult Admit      06/17/18 1848          Significant initial  Findings: Abnormal Labs Reviewed  COMPREHENSIVE METABOLIC PANEL - Abnormal; Notable for the following components:      Result Value   Glucose, Bld 127 (*)    BUN 45 (*)    Creatinine, Ser 1.56 (*)    GFR calc non Af Amer 28 (*)    GFR calc Af Amer 33 (*)    All other components within normal limits  CBC WITH DIFFERENTIAL/PLATELET - Abnormal;  Notable for the following components:   WBC 13.6 (*)    Hemoglobin 11.9 (*)    Neutro Abs 11.9 (*)    Lymphs Abs 0.6 (*)    Abs Immature Granulocytes 0.09 (*)    All other components within normal limits  URINALYSIS, ROUTINE W REFLEX MICROSCOPIC - Abnormal; Notable for the following components:   Color, Urine AMBER (*)    APPearance CLOUDY (*)    Ketones, ur 5 (*)    Protein, ur 100 (*)    Bacteria, UA MANY (*)    All other components within normal limits    Otherwise labs showing:    Recent Labs  Lab 06/17/18 1634  NA 138  K 4.7  CO2 26  GLUCOSE 127*  BUN 45*  CREATININE 1.56*  CALCIUM 10.3    Cr Up from baseline see below Lab Results  Component Value Date   CREATININE 1.56 (H) 06/17/2018   CREATININE 1.18 (H) 05/07/2018   CREATININE 1.25 (H) 09/03/2017    Recent Labs  Lab 06/17/18 1634  AST 16  ALT 11  ALKPHOS 81  BILITOT 0.9  PROT 8.0  ALBUMIN 3.9   Lab Results  Component Value Date   CALCIUM 10.3 06/17/2018    WBC      Component Value Date/Time   WBC 13.6 (H) 06/17/2018 1634   ANC    Component Value Date/Time   NEUTROABS 11.9 (H) 06/17/2018 1634   NEUTROABS 5.9 05/07/2018 1209   NEUTROABS 4.6 04/09/2013 1442    Plt: Lab Results  Component Value Date   PLT PLATELET CLUMPS NOTED ON SMEAR, UNABLE TO ESTIMATE 06/17/2018      COVID-19 Labs     Lab Results  Component Value Date   SARSCOV2NAA NEGATIVE 06/17/2018     HG/HCT  Down  from baseline see below    Component Value Date/Time   HGB 11.9 (L) 06/17/2018 1634   HGB 12.7 05/07/2018 1209   HGB 13.8 04/09/2013 1442   HCT 38.4 06/17/2018 1634   HCT 38.4 05/07/2018 1209   HCT 40.9 04/09/2013 1442   Troponin   Cardiac Panel (last 3 results) Recent Labs    06/17/18 1634  TROPONINI <0.03   CBG (last 3)  No results for input(s): GLUCAP in the last 72 hours.     UA  evidence of UTI     Urine analysis:    Component Value Date/Time   COLORURINE AMBER (A) 06/17/2018 1556    APPEARANCEUR CLOUDY (A) 06/17/2018 1556   LABSPEC 1.013 06/17/2018 1556   PHURINE 8.0 06/17/2018 1556   GLUCOSEU NEGATIVE 06/17/2018 1556   HGBUR  NEGATIVE 06/17/2018 1556   BILIRUBINUR NEGATIVE 06/17/2018 1556   KETONESUR 5 (A) 06/17/2018 1556   PROTEINUR 100 (A) 06/17/2018 1556   UROBILINOGEN 0.2 09/01/2014 0634   NITRITE NEGATIVE 06/17/2018 1556   LEUKOCYTESUR NEGATIVE 06/17/2018 1556     Pelvis -Images unremarkable  CT neck unremarkable CT HEAD  NON acute  CXR - NON acute  KUB-constipation  ECG:  Personally reviewed by me showing: HR : 82 Rhythm: NSR tracheal delay, PVCs   no evidence of ischemic changes QTC 421      ED Triage Vitals [06/17/18 1433]  Enc Vitals Group     BP 129/62     Pulse Rate 82     Resp 17     Temp 98 F (36.7 C)     Temp Source Oral     SpO2 98 %     Weight      Height      Head Circumference      Peak Flow      Pain Score      Pain Loc      Pain Edu?      Excl. in [place]?   MVHQ(46)@       Latest  Blood pressure 102/81, pulse 78, temperature 98 F (36.7 C), temperature source Oral, resp. rate 18, SpO2 97 %.     Hospitalist was called for admission for syncope   Review of Systems:    Pertinent positives include:  fatigue,  confusion  Constitutional:  No weight loss, night sweats, Fevers, chills,weight loss  HEENT:  No headaches, Difficulty swallowing,Tooth/dental problems,Sore throat,  No sneezing, itching, ear ache, nasal congestion, post nasal drip,  Cardio-vascular:  No chest pain, Orthopnea, PND, anasarca, dizziness, palpitations.no Bilateral lower extremity swelling  GI:  No heartburn, indigestion, abdominal pain, nausea, vomiting, diarrhea, change in bowel habits, loss of appetite, melena, blood in stool, hematemesis Resp:  no shortness of breath at rest. No dyspnea on exertion, No excess mucus, no productive cough, No non-productive cough, No coughing up of blood.No change in color of mucus.No wheezing. Skin:  no  rash or lesions. No jaundice GU:  no dysuria, change in color of urine, no urgency or frequency. No straining to urinate.  No flank pain.  Musculoskeletal:  No joint pain or no joint swelling. No decreased range of motion. No back pain.  Psych:  No change in mood or affect. No depression or anxiety. No memory loss.  Neuro: no localizing neurological complaints, no tingling, no weakness, no double vision, no gait abnormality, no slurred speech, no confusion  All systems reviewed and apart from [place] all are negative  Past Medical History:   Past Medical History:  Diagnosis Date   Barrett's esophagus    Bladder cancer ([place]) 04/30/2014   Dysrhythmia    occasional skip    Gastric mass    Gastrointestinal stromal tumor (GIST) ([facility])    gastric cardia   Hypertension    Hypothyroidism    Memory disorder 08/30/2017   Skin cancer    ON FACE      Past Surgical History:  Procedure Laterality Date   CATARACT EXTRACTION, BILATERAL     COLONOSCOPY     CYSTOSCOPY N/A 04/30/2014   Procedure: CYSTOSCOPY;  Surgeon: Irine Seal, MD;  Location: [facility];  Service: Urology;  Laterality: N/A;   CYSTOSCOPY WITH INJECTION N/A 06/26/2014   Procedure: CYSTOSCOPY WITH INJECTION OF INDOCYANINE GREEN DYE;  Surgeon: Alexis Frock, MD;  Location: [facility];  Service: Urology;  Laterality: N/A;   EUS N/A 09/28/2016   Procedure: UPPER ENDOSCOPIC ULTRASOUND (EUS) LINEAR;  Surgeon: [name], MD;  Location: [facility];  Service: Endoscopy;  Laterality: N/A;   hx of skin cancer removal     locally done    IR URETERAL STENT LEFT NEW ACCESS W/O SEP NEPHROSTOMY CATH  09/07/2017   NEPHROLITHOTOMY Left 09/07/2017   Procedure: LEFT ANTEGRADE URETEROSCOPY, DILATING OF STRICTURE;  Surgeon: Alexis Frock, MD;  Location: [facility];  Service: Urology;  Laterality: Left;   ROBOT ASSISTED LAPAROSCOPIC COMPLETE CYSTECT ILEAL CONDUIT N/A 06/26/2014   Procedure: ROBOTIC ASSISTED LAPAROSCOPIC COMPLETE CYSTECT  ILEAL CONDUIT;  Surgeon: Alexis Frock, MD;  Location: [facility];  Service: Urology;  Laterality: N/A;   ROBOTIC ASSISTED LAPAROSCOPIC HYSTERECTOMY AND SALPINGECTOMY N/A 06/26/2014   Procedure: ROBOTIC ASSISTED LAPAROSCOPIC HYSTERECTOMY AND BILATERAL SALPINGO-OPHERECTOMY;  Surgeon: Alexis Frock, MD;  Location: [facility];  Service: Urology;  Laterality: N/A;   TRANSURETHRAL RESECTION OF BLADDER TUMOR N/A 04/30/2014   Procedure: TRANSURETHRAL RESECTION OF BLADDER TUMOR (TURBT);  Surgeon: Irine Seal, MD;  Location: [facility];  Service: Urology;  Laterality: N/A;   UPPER GASTROINTESTINAL ENDOSCOPY      Social History:  Ambulatory   independently       reports that she has quit smoking. Her smoking use included cigarettes. She smoked 0.50 packs per day. She has never used smokeless tobacco. She reports current alcohol use. She reports that she does not use drugs.   Family History:   Family History  Problem Relation Age of Onset   Stroke Maternal Aunt    Hypertension Mother    Bladder Cancer Neg Hx    Stomach cancer Neg Hx    Colon cancer Neg Hx    Esophageal cancer Neg Hx    Rectal cancer Neg Hx     Allergies: Allergies  Allergen Reactions   Diovan [Valsartan] Itching   Codeine Nausea And Vomiting     Prior to Admission medications   Medication Sig Start Date End Date Taking? Authorizing Provider  CALCIUM CITRATE PO Take 630 mg by mouth daily.    [provider]  Cyanocobalamin (VITAMIN B-12) 5000 MCG TBDP Take 5,000 mcg by mouth daily.    [provider]  losartan (COZAAR) 100 MG tablet Take 1 tablet (100 mg total) by mouth daily. 09/03/14   Rai, Vernelle Emerald, MD  Multiple Vitamin (MULTIVITAMIN WITH MINERALS) TABS tablet Take 1 tablet by mouth daily.    [provider]  SYNTHROID 88 MCG tablet Take 88 mcg by mouth daily before breakfast.  03/19/13   [provider]   Physical Exam: Blood pressure 102/81, pulse 78, temperature 98 F (36.7  C), temperature source Oral, resp. rate 18, SpO2 97 %. 1. General:  in No  Acute distress   well -appearing 2. Psychological: Alert and Oriented 3. Head/ENT:     Dry Mucous Membranes                          Head Non traumatic, neck supple                           Poor Dentition 4. SKIN: normal decreased Skin turgor,  Skin clean Dry and intact no rash 5. Heart: Regular rate and rhythm no  Murmur, no Rub or gallop 6. Lungs: Clear to auscultation bilaterally, no wheezes or crackles   7. Abdomen: Soft, non-tender, Non distended  obese  bowel sounds present 8. Lower extremities: no clubbing, cyanosis, no  edema 9. Neurologically Grossly intact, moving all 4 extremities equally   10. MSK: Normal range of motion   All other LABS:     Recent Labs  Lab 06/17/18 1634  WBC 13.6*  NEUTROABS 11.9*  HGB 11.9*  HCT 38.4  MCV 91.0  PLT PLATELET CLUMPS NOTED ON SMEAR, UNABLE TO ESTIMATE     Recent Labs  Lab 06/17/18 1634  NA 138  K 4.7  CL 101  CO2 26  GLUCOSE 127*  BUN 45*  CREATININE 1.56*  CALCIUM 10.3     Recent Labs  Lab 06/17/18 1634  AST 16  ALT 11  ALKPHOS 81  BILITOT 0.9  PROT 8.0  ALBUMIN 3.9       Cultures:    Component Value Date/Time   SDES URINE, CLEAN CATCH 09/01/2014 0634   SPECREQUEST NONE 09/01/2014 0634   CULT  09/01/2014 0634    MULTIPLE SPECIES PRESENT, SUGGEST RECOLLECTION Performed at [facility] 09/02/2014 FINAL 09/01/2014 0634     Radiological Exams on Admission: Dg Chest 2 View  Result Date: 06/17/2018 CLINICAL DATA:  Syncope. EXAM: CHEST - 2 VIEW COMPARISON:  Radiographs of August 09, 2016. FINDINGS: The heart size and mediastinal contours are within normal limits. Both lungs are clear. No pneumothorax or pleural effusion is noted. The visualized skeletal structures are unremarkable. IMPRESSION: No active cardiopulmonary disease. Aortic Atherosclerosis (ICD10-I70.0). Electronically Signed   By: Marijo Conception M.D.    On: 06/17/2018 16:25   Dg Pelvis 1-2 Views  Result Date: 06/17/2018 CLINICAL DATA:  Syncope.  Fall. EXAM: PELVIS - 1-2 VIEW COMPARISON:  None. FINDINGS: There is no evidence of pelvic fracture or diastasis. Moderate narrowing of the right hip joint is noted. No pelvic bone lesions are seen. IMPRESSION: Moderate osteoarthritis of the right hip. No acute abnormality seen in the pelvis. Electronically Signed   By: Marijo Conception M.D.   On: 06/17/2018 16:27   Dg Abd 1 View  Result Date: 06/17/2018 CLINICAL DATA:  Constipation EXAM: ABDOMEN - 1 VIEW COMPARISON:  06/17/2018 FINDINGS: There is a moderate amount of stool in the colon and rectum. This appears slightly improved from prior study. Surgical staple lines are noted in the patient's pelvis. The bowel gas pattern is nonspecific. There is some gaseous distension of the colon. Osteoarthritis is again noted of both hips. There are degenerative changes throughout the lumbar spine. IMPRESSION: Moderate stool burden. Electronically Signed   By: Constance Holster M.D.   On: 06/17/2018 20:29   Ct Head Wo Contrast  Result Date: 06/17/2018 CLINICAL DATA:  Syncopal episode, fall, confusion EXAM: CT HEAD WITHOUT CONTRAST CT CERVICAL SPINE WITHOUT CONTRAST TECHNIQUE: Multidetector CT imaging of the head and cervical spine was performed following the standard protocol without intravenous contrast. Multiplanar CT image reconstructions of the cervical spine were also generated. COMPARISON:  Prior CT scan of the head 08/31/2014 FINDINGS: CT HEAD FINDINGS Brain: No evidence of acute infarction, hemorrhage, hydrocephalus, extra-axial collection or mass lesion/mass effect. Cerebral and cerebellar cortical atrophy. Moderate periventricular white matter hypoattenuation most consistent with chronic microvascular ischemic white matter disease. Vascular: No hyperdense vessel or unexpected calcification. Skull: Normal. Negative for fracture or focal lesion. Sinuses/Orbits: No  acute finding. Other: None. CT CERVICAL SPINE FINDINGS Alignment: Straightening of the normal cervical lordosis. Skull base and vertebrae: No evidence of acute fracture or malalignment Soft tissues and spinal canal: No  prevertebral fluid or swelling. No visible canal hematoma. Disc levels: Cervical spondylosis most notable at C5-C6 and C6-C7. Multilevel anterolisthesis most significant at 2-3 mm of C2 on C3 and 3 mm of C3 on C4. Left-sided facet arthropathy and ankylosis of the posterior elements at C2-C3. Upper chest: Negative. Other: None IMPRESSION: CT HEAD 1. No acute intracranial abnormality. 2. Atrophy and chronic microvascular ischemic white matter disease. CT CSPINE 1. No acute fracture or malalignment. 2. Multilevel cervical spondylosis. Electronically Signed   By: Jacqulynn Cadet M.D.   On: 06/17/2018 16:38   Ct Cervical Spine Wo Contrast  Result Date: 06/17/2018 CLINICAL DATA:  Syncopal episode, fall, confusion EXAM: CT HEAD WITHOUT CONTRAST CT CERVICAL SPINE WITHOUT CONTRAST TECHNIQUE: Multidetector CT imaging of the head and cervical spine was performed following the standard protocol without intravenous contrast. Multiplanar CT image reconstructions of the cervical spine were also generated. COMPARISON:  Prior CT scan of the head 08/31/2014 FINDINGS: CT HEAD FINDINGS Brain: No evidence of acute infarction, hemorrhage, hydrocephalus, extra-axial collection or mass lesion/mass effect. Cerebral and cerebellar cortical atrophy. Moderate periventricular white matter hypoattenuation most consistent with chronic microvascular ischemic white matter disease. Vascular: No hyperdense vessel or unexpected calcification. Skull: Normal. Negative for fracture or focal lesion. Sinuses/Orbits: No acute finding. Other: None. CT CERVICAL SPINE FINDINGS Alignment: Straightening of the normal cervical lordosis. Skull base and vertebrae: No evidence of acute fracture or malalignment Soft tissues and spinal canal: No  prevertebral fluid or swelling. No visible canal hematoma. Disc levels: Cervical spondylosis most notable at C5-C6 and C6-C7. Multilevel anterolisthesis most significant at 2-3 mm of C2 on C3 and 3 mm of C3 on C4. Left-sided facet arthropathy and ankylosis of the posterior elements at C2-C3. Upper chest: Negative. Other: None IMPRESSION: CT HEAD 1. No acute intracranial abnormality. 2. Atrophy and chronic microvascular ischemic white matter disease. CT CSPINE 1. No acute fracture or malalignment. 2. Multilevel cervical spondylosis. Electronically Signed   By: Jacqulynn Cadet M.D.   On: 06/17/2018 16:38    Chart has been reviewed    Assessment/Plan   85 y.o. female with medical history significant of mild dementia, B12 deficiency, bladder cancer sp urostomy, gastrointestinal stromal tumor, hypothyroidism, HTN    Admitted for syncope  Present on Admission:  Syncope-  given risk factor will admit rehydrate obtain CE, monitor on tele and obtain carotid dopplers and Echo if any abnormal work-up may need cardiology consult   Leukocytosis -in the setting of mild dehydration and possible UTI  Hypothyroidism - - Check TSH continue home medications at current dose   Memory disorder mild monitor for any signs of sundowning CS for safety for discharge prior to discharge  Bladder cancer [facility]) followed by urology is status post urostomy  Essential hypertension stable hold off on Cozaar while having elevated creatinine   AKI (acute kidney injury) ([place]) continue rehydrate and follow urine electrolytes   Dehydration=  rehydrate and check orthostatics prior to discharge to make sure patient is stable  Acute lower UTI -patient is denying fevers.  But does have leukocytosis malaise and overall feeling well with urine suggestive of possible infectious source.  Will initiate antibiotics and await results of urine culture  Obstipation -bowel regimen until has good bowel movement, attempt to use milk  of molasses enema to see if that will produce a good BM. He knows not to do total  Other plan as per orders.  DVT prophylaxis:  Lovenox     Code Status: DNR/DNI  as per  patient   I had personally discussed CODE STATUS with patient    Family Communication:   Family not at  Bedside    Disposition Plan:     To home once workup is complete and patient is stable                     Would benefit from PT/OT eval prior to DC  Ordered                    Consults called: none   Admission status:  ED Disposition    ED Disposition Condition [place]: [place] [[id number]]  Level of Care: Telemetry [5]  Admit to tele based on following criteria: Eval of Syncope  Covid Evaluation: N/A  Diagnosis: Syncope [092330]  Admitting Physician: Toy Baker [[id number]]  Attending Physician: Toy Baker [[id number]]  PT Class (Do Not Modify): Observation [104]  PT Acc Code (Do Not Modify): Observation [10022]      Obs    Level of care  tele  For 12H    Precautions:  NONE   No active isolations  PPE: Used by the provider:   P100  eye Goggles,  Gloves    Taleya Whitcher 06/17/2018, 8:59 PM    [facility]     after 2 AM please page floor coverage PA If 7AM-7PM, please contact the day team taking care of the patient using [email or web address]

## 2018-06-17 NOTE — ED Notes (Signed)
ED TO INPATIENT HANDOFF REPORT  Name/Age/Gender Victoria Patrick 85 y.o. female  Code Status Code Status History    Date Active Date Inactive Code Status Order ID Comments User Context   08/31/2014 2232 09/03/2014 1541 DNR [id number]  Rise Patience, MD Inpatient   08/31/2014 2205 08/31/2014 2232 DNR [id number]  Rise Patience, MD Inpatient   06/26/2014 1538 07/02/2014 1824 Full Code [id number]  Alexis Frock, MD Inpatient   04/30/2014 1310 05/02/2014 1434 Full Code [id number]  Irine Seal, MD Inpatient    Questions for Most Recent Historical Code Status (Order [id number])    Question Answer Comment   In the event of cardiac or respiratory ARREST Do not call a "code blue"    In the event of cardiac or respiratory ARREST Do not perform Intubation, CPR, defibrillation or ACLS    In the event of cardiac or respiratory ARREST Use medication by any route, position, wound care, and other measures to relive pain and suffering. May use oxygen, suction and manual treatment of airway obstruction as needed for comfort.       Home/SNF/Other Home  Chief Complaint syncope  Level of Care/Admitting Diagnosis ED Disposition    ED Disposition Condition Comment   Admit  Hospital Area: [facility] [[id number]]  Level of Care: Telemetry [5]  Admit to tele based on following criteria: Eval of Syncope  Covid Evaluation: N/A  Diagnosis: Syncope [500938]  Admitting Physician: Toy Baker [[id number]]  Attending Physician: Toy Baker [[id number]]  PT Class (Do Not Modify): Observation [104]  PT Acc Code (Do Not Modify): Observation [10022]       Medical History Past Medical History:  Diagnosis Date  . Barrett's esophagus   . Bladder cancer ([facility]) 04/30/2014  . Dysrhythmia    occasional skip   . Gastric mass   . 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 Hypertension   . Hypothyroidism   . Memory disorder 08/30/2017  . Skin cancer    ON FACE     Allergies Allergies  Allergen Reactions  . Diovan [Valsartan] Itching  . Codeine Nausea And Vomiting    IV Location/Drains/Wounds Patient Lines/Drains/Airways Status   Active Line/Drains/Airways    Name:   Placement date:   Placement time:   Site:   Days:   Peripheral IV 06/17/18 Left Antecubital   06/17/18    1637    Antecubital   less than 1   Ureteral Drain/Stent Left ureter 5 Fr.   09/07/17    1454    Left ureter   283   Incision (Closed) 09/07/17 Back Left   09/07/17    1459     283          Labs/Imaging Results for orders placed or performed during the hospital encounter of 06/17/18 (from the past 48 hour(s))  Urinalysis, Routine w reflex microscopic     Status: Abnormal   Collection Time: 06/17/18  3:56 PM  Result Value Ref Range   Color, Urine AMBER (A) YELLOW    Comment: BIOCHEMICALS MAY BE AFFECTED BY COLOR   APPearance CLOUDY (A) CLEAR   Specific Gravity, Urine 1.013 1.005 - 1.030   pH 8.0 5.0 - 8.0   Glucose, UA NEGATIVE NEGATIVE mg/dL   Hgb urine dipstick NEGATIVE NEGATIVE   Bilirubin Urine NEGATIVE NEGATIVE   Ketones, ur 5 (A) NEGATIVE mg/dL   Protein, ur 100 (A) NEGATIVE mg/dL   Nitrite NEGATIVE NEGATIVE   Leukocytes,Ua NEGATIVE NEGATIVE  RBC / HPF 0-5 0 - 5 RBC/hpf   WBC, UA 21-50 0 - 5 WBC/hpf   Bacteria, UA MANY (A) NONE SEEN   Mucus PRESENT    Triple Phosphate Crystal PRESENT     Comment: Performed at [facility], [place] [address] [place], [place] [address]  Comprehensive metabolic panel     Status: Abnormal   Collection Time: 06/17/18  4:34 PM  Result Value Ref Range   Sodium 138 135 - 145 mmol/L   Potassium 4.7 3.5 - 5.1 mmol/L   Chloride 101 98 - 111 mmol/L   CO2 26 22 - 32 mmol/L   Glucose, Bld 127 (H) 70 - 99 mg/dL   BUN 45 (H) 8 - 23 mg/dL   Creatinine, Ser 1.56 (H) 0.44 - 1.00 mg/dL   Calcium 10.3 8.9 - 10.3 mg/dL   Total Protein 8.0 6.5 - 8.1 g/dL   Albumin 3.9 3.5 - 5.0 g/dL   AST 16 15 - 41 U/L   ALT 11 0 -  44 U/L   Alkaline Phosphatase 81 38 - 126 U/L   Total Bilirubin 0.9 0.3 - 1.2 mg/dL   GFR calc non Af Amer 28 (L) >60 mL/min   GFR calc Af Amer 33 (L) >60 mL/min   Anion gap 11 5 - 15    Comment: Performed at [facility], [place] [address] [place], [place] [address]  Troponin I - Once     Status: None   Collection Time: 06/17/18  4:34 PM  Result Value Ref Range   Troponin I <0.03 <0.03 ng/mL    Comment: Performed at [facility], [place] [address] [place], [place] [address]  CBC with Differential     Status: Abnormal   Collection Time: 06/17/18  4:34 PM  Result Value Ref Range   WBC 13.6 (H) 4.0 - 10.5 K/uL   RBC 4.22 3.87 - 5.11 MIL/uL   Hemoglobin 11.9 (L) 12.0 - 15.0 g/dL   HCT 38.4 36.0 - 46.0 %   MCV 91.0 80.0 - 100.0 fL   MCH 28.2 26.0 - 34.0 pg   MCHC 31.0 30.0 - 36.0 g/dL   RDW 12.3 11.5 - 15.5 %   Platelets PLATELET CLUMPS NOTED ON SMEAR, UNABLE TO ESTIMATE 150 - 400 K/uL   nRBC 0.0 0.0 - 0.2 %   Neutrophils Relative % 88 %   Neutro Abs 11.9 (H) 1.7 - 7.7 K/uL   Lymphocytes Relative 5 %   Lymphs Abs 0.6 (L) 0.7 - 4.0 K/uL   Monocytes Relative 6 %   Monocytes Absolute 0.9 0.1 - 1.0 K/uL   Eosinophils Relative 0 %   Eosinophils Absolute 0.0 0.0 - 0.5 K/uL   Basophils Relative 0 %   Basophils Absolute 0.0 0.0 - 0.1 K/uL   RBC Morphology MORPHOLOGY UNREMARKABLE    Immature Granulocytes 1 %   Abs Immature Granulocytes 0.09 (H) 0.00 - 0.07 K/uL    Comment: Performed at [facility], [place] [address], [place], [place] [address]  Protime-INR     Status: None   Collection Time: 06/17/18  4:34 PM  Result Value Ref Range   Prothrombin Time 13.2 11.4 - 15.2 seconds   INR 1.0 0.8 - 1.2    Comment: (NOTE) INR goal varies based on device and disease states. Performed at [facility], [place] [address], [place], [place] [address]    Dg Chest 2 View  Result Date: 06/17/2018 CLINICAL DATA:  Syncope. EXAM:  CHEST - 2 VIEW COMPARISON:  Radiographs of August 09, 2016. FINDINGS: The heart size and mediastinal contours are within normal limits. Both lungs are clear. No pneumothorax or pleural effusion is noted. The visualized skeletal structures are unremarkable. IMPRESSION: No active cardiopulmonary disease. Aortic Atherosclerosis (ICD10-I70.0). Electronically Signed   By: Marijo Conception M.D.   On: 06/17/2018 16:25   Dg Pelvis 1-2 Views  Result Date: 06/17/2018 CLINICAL DATA:  Syncope.  Fall. EXAM: PELVIS - 1-2 VIEW COMPARISON:  None. FINDINGS: There is no evidence of pelvic fracture or diastasis. Moderate narrowing of the right hip joint is noted. No pelvic bone lesions are seen. IMPRESSION: Moderate osteoarthritis of the right hip. No acute abnormality seen in the pelvis. Electronically Signed   By: Marijo Conception M.D.   On: 06/17/2018 16:27   Dg Abd 1 View  Result Date: 06/17/2018 CLINICAL DATA:  Constipation EXAM: ABDOMEN - 1 VIEW COMPARISON:  06/17/2018 FINDINGS: There is a moderate amount of stool in the colon and rectum. This appears slightly improved from prior study. Surgical staple lines are noted in the patient's pelvis. The bowel gas pattern is nonspecific. There is some gaseous distension of the colon. Osteoarthritis is again noted of both hips. There are degenerative changes throughout the lumbar spine. IMPRESSION: Moderate stool burden. Electronically Signed   By: Constance Holster M.D.   On: 06/17/2018 20:29   Ct Head Wo Contrast  Result Date: 06/17/2018 CLINICAL DATA:  Syncopal episode, fall, confusion EXAM: CT HEAD WITHOUT CONTRAST CT CERVICAL SPINE WITHOUT CONTRAST TECHNIQUE: Multidetector CT imaging of the head and cervical spine was performed following the standard protocol without intravenous contrast. Multiplanar CT image reconstructions of the cervical spine were also generated. COMPARISON:  Prior CT scan of the head 08/31/2014 FINDINGS: CT HEAD FINDINGS Brain: No evidence of acute  infarction, hemorrhage, hydrocephalus, extra-axial collection or mass lesion/mass effect. Cerebral and cerebellar cortical atrophy. Moderate periventricular white matter hypoattenuation most consistent with chronic microvascular ischemic white matter disease. Vascular: No hyperdense vessel or unexpected calcification. Skull: Normal. Negative for fracture or focal lesion. Sinuses/Orbits: No acute finding. Other: None. CT CERVICAL SPINE FINDINGS Alignment: Straightening of the normal cervical lordosis. Skull base and vertebrae: No evidence of acute fracture or malalignment Soft tissues and spinal canal: No prevertebral fluid or swelling. No visible canal hematoma. Disc levels: Cervical spondylosis most notable at C5-C6 and C6-C7. Multilevel anterolisthesis most significant at 2-3 mm of C2 on C3 and 3 mm of C3 on C4. Left-sided facet arthropathy and ankylosis of the posterior elements at C2-C3. Upper chest: Negative. Other: None IMPRESSION: CT HEAD 1. No acute intracranial abnormality. 2. Atrophy and chronic microvascular ischemic white matter disease. CT CSPINE 1. No acute fracture or malalignment. 2. Multilevel cervical spondylosis. Electronically Signed   By: Jacqulynn Cadet M.D.   On: 06/17/2018 16:38   Ct Cervical Spine Wo Contrast  Result Date: 06/17/2018 CLINICAL DATA:  Syncopal episode, fall, confusion EXAM: CT HEAD WITHOUT CONTRAST CT CERVICAL SPINE WITHOUT CONTRAST TECHNIQUE: Multidetector CT imaging of the head and cervical spine was performed following the standard protocol without intravenous contrast. Multiplanar CT image reconstructions of the cervical spine were also generated. COMPARISON:  Prior CT scan of the head 08/31/2014 FINDINGS: CT HEAD FINDINGS Brain: No evidence of acute infarction, hemorrhage, hydrocephalus, extra-axial collection or mass lesion/mass effect. Cerebral and cerebellar cortical atrophy. Moderate periventricular white matter hypoattenuation most consistent with chronic  microvascular ischemic white matter disease. Vascular: No hyperdense vessel or unexpected  calcification. Skull: Normal. Negative for fracture or focal lesion. Sinuses/Orbits: No acute finding. Other: None. CT CERVICAL SPINE FINDINGS Alignment: Straightening of the normal cervical lordosis. Skull base and vertebrae: No evidence of acute fracture or malalignment Soft tissues and spinal canal: No prevertebral fluid or swelling. No visible canal hematoma. Disc levels: Cervical spondylosis most notable at C5-C6 and C6-C7. Multilevel anterolisthesis most significant at 2-3 mm of C2 on C3 and 3 mm of C3 on C4. Left-sided facet arthropathy and ankylosis of the posterior elements at C2-C3. Upper chest: Negative. Other: None IMPRESSION: CT HEAD 1. No acute intracranial abnormality. 2. Atrophy and chronic microvascular ischemic white matter disease. CT CSPINE 1. No acute fracture or malalignment. 2. Multilevel cervical spondylosis. Electronically Signed   By: Jacqulynn Cadet M.D.   On: 06/17/2018 16:38    Pending Labs Unresulted Labs (From admission, onward)    Start     Ordered   06/17/18 2002  Magnesium  Add-on,   R     06/17/18 2001   06/17/18 2000  Sodium, urine, random  Once,   R     06/17/18 1959   06/17/18 1959  Creatinine, urine, random  Once,   R     06/17/18 1959   06/17/18 1953  Troponin I - Now Then Q6H  Now then every 6 hours,   R     06/17/18 1952   06/17/18 1856  SARS Coronavirus 2 (CEPHEID - Performed in [facility] hospital lab), Hosp Order  (Asymptomatic Patients Labs)  Once,   R    Question:  Rule Out  Answer:  Yes   06/17/18 1855   06/17/18 1556  Urine culture  ONCE - STAT,   STAT     06/17/18 1555   Signed and Held  Magnesium  Tomorrow morning,   R    Comments:  Call MD if <1.5    Signed and Held   Signed and Held  Phosphorus  Tomorrow morning,   R     Signed and Held   Signed and Held  TSH  Once,   R    Comments:  Cancel if already done within 1 month and notify MD    Signed  and Held   Signed and Held  Comprehensive metabolic panel  Once,   R    Comments:  Cal MD for K<3.5 or >5.0    Signed and Held   Signed and Held  CBC  Once,   R    Comments:  Call for hg <8.0    Signed and Held          Vitals/Pain Today's Vitals   06/17/18 1600 06/17/18 1633 06/17/18 1635 06/17/18 1959  BP: 102/81 104/82 102/81 (!) 96/46  Pulse:  74 78 88  Resp: 18 18 18  (!) 21  Temp:      TempSrc:      SpO2:  95% 97% 97%    Isolation Precautions No active isolations  Medications Medications  cefTRIAXone (ROCEPHIN) 1 g in sodium chloride 0.9 % 100 mL IVPB (1 g Intravenous New Bag/Given 06/17/18 2023)  sodium chloride 0.9 % bolus 500 mL (0 mLs Intravenous Stopped 06/17/18 2022)    Mobility non-ambulatory

## 2018-06-17 NOTE — ED Triage Notes (Signed)
Per EMS: Pt is coming from [place]. She was attempting to order food and passed out. Pt is reportedly anxious. Pt denies taking blood thinners. No reported redness or deformity. Pt cleared spinal clearance protocol. Pt denies numbness, tingling or SOB. Pt denies remembering the event.  Pt was reportedly confused about which hospital she was going to.

## 2018-06-18 ENCOUNTER — Observation Stay (HOSPITAL_COMMUNITY): Payer: Medicare Other

## 2018-06-18 ENCOUNTER — Observation Stay (HOSPITAL_BASED_OUTPATIENT_CLINIC_OR_DEPARTMENT_OTHER): Payer: Medicare Other

## 2018-06-18 DIAGNOSIS — Z66 Do not resuscitate: Secondary | ICD-10-CM | POA: Diagnosis present

## 2018-06-18 DIAGNOSIS — K3189 Other diseases of stomach and duodenum: Secondary | ICD-10-CM | POA: Diagnosis not present

## 2018-06-18 DIAGNOSIS — F419 Anxiety disorder, unspecified: Secondary | ICD-10-CM | POA: Diagnosis not present

## 2018-06-18 DIAGNOSIS — R2689 Other abnormalities of gait and mobility: Secondary | ICD-10-CM | POA: Diagnosis not present

## 2018-06-18 DIAGNOSIS — R278 Other lack of coordination: Secondary | ICD-10-CM | POA: Diagnosis not present

## 2018-06-18 DIAGNOSIS — R531 Weakness: Secondary | ICD-10-CM | POA: Diagnosis not present

## 2018-06-18 DIAGNOSIS — R55 Syncope and collapse: Secondary | ICD-10-CM

## 2018-06-18 DIAGNOSIS — N179 Acute kidney failure, unspecified: Secondary | ICD-10-CM | POA: Diagnosis present

## 2018-06-18 DIAGNOSIS — N390 Urinary tract infection, site not specified: Secondary | ICD-10-CM | POA: Diagnosis present

## 2018-06-18 DIAGNOSIS — E860 Dehydration: Secondary | ICD-10-CM | POA: Diagnosis present

## 2018-06-18 DIAGNOSIS — Z85828 Personal history of other malignant neoplasm of skin: Secondary | ICD-10-CM | POA: Diagnosis not present

## 2018-06-18 DIAGNOSIS — Z1159 Encounter for screening for other viral diseases: Secondary | ICD-10-CM | POA: Diagnosis not present

## 2018-06-18 DIAGNOSIS — B964 Proteus (mirabilis) (morganii) as the cause of diseases classified elsewhere: Secondary | ICD-10-CM | POA: Diagnosis present

## 2018-06-18 DIAGNOSIS — R41841 Cognitive communication deficit: Secondary | ICD-10-CM | POA: Diagnosis not present

## 2018-06-18 DIAGNOSIS — Z8551 Personal history of malignant neoplasm of bladder: Secondary | ICD-10-CM | POA: Diagnosis not present

## 2018-06-18 DIAGNOSIS — K649 Unspecified hemorrhoids: Secondary | ICD-10-CM | POA: Diagnosis present

## 2018-06-18 DIAGNOSIS — I10 Essential (primary) hypertension: Secondary | ICD-10-CM | POA: Diagnosis present

## 2018-06-18 DIAGNOSIS — K5900 Constipation, unspecified: Secondary | ICD-10-CM | POA: Diagnosis not present

## 2018-06-18 DIAGNOSIS — R413 Other amnesia: Secondary | ICD-10-CM | POA: Diagnosis not present

## 2018-06-18 DIAGNOSIS — E039 Hypothyroidism, unspecified: Secondary | ICD-10-CM | POA: Diagnosis present

## 2018-06-18 DIAGNOSIS — Z87891 Personal history of nicotine dependence: Secondary | ICD-10-CM | POA: Diagnosis not present

## 2018-06-18 DIAGNOSIS — R4020 Unspecified coma: Secondary | ICD-10-CM | POA: Diagnosis not present

## 2018-06-18 DIAGNOSIS — R279 Unspecified lack of coordination: Secondary | ICD-10-CM | POA: Diagnosis not present

## 2018-06-18 DIAGNOSIS — R410 Disorientation, unspecified: Secondary | ICD-10-CM | POA: Diagnosis not present

## 2018-06-18 DIAGNOSIS — I951 Orthostatic hypotension: Secondary | ICD-10-CM | POA: Diagnosis present

## 2018-06-18 DIAGNOSIS — C679 Malignant neoplasm of bladder, unspecified: Secondary | ICD-10-CM | POA: Diagnosis not present

## 2018-06-18 DIAGNOSIS — Z79890 Hormone replacement therapy (postmenopausal): Secondary | ICD-10-CM | POA: Diagnosis not present

## 2018-06-18 DIAGNOSIS — Z743 Need for continuous supervision: Secondary | ICD-10-CM | POA: Diagnosis not present

## 2018-06-18 DIAGNOSIS — F0390 Unspecified dementia without behavioral disturbance: Secondary | ICD-10-CM | POA: Diagnosis present

## 2018-06-18 DIAGNOSIS — R4189 Other symptoms and signs involving cognitive functions and awareness: Secondary | ICD-10-CM | POA: Diagnosis present

## 2018-06-18 LAB — CBC
HCT: 31.6 % — ABNORMAL LOW (ref 36.0–46.0)
Hemoglobin: 9.7 g/dL — ABNORMAL LOW (ref 12.0–15.0)
MCH: 28 pg (ref 26.0–34.0)
MCHC: 30.7 g/dL (ref 30.0–36.0)
MCV: 91.1 fL (ref 80.0–100.0)
Platelets: UNDETERMINED 10*3/uL (ref 150–400)
RBC: 3.47 MIL/uL — ABNORMAL LOW (ref 3.87–5.11)
RDW: 12.2 % (ref 11.5–15.5)
WBC: 8.7 10*3/uL (ref 4.0–10.5)
nRBC: 0 % (ref 0.0–0.2)

## 2018-06-18 LAB — COMPREHENSIVE METABOLIC PANEL
ALT: 9 U/L (ref 0–44)
AST: 14 U/L — ABNORMAL LOW (ref 15–41)
Albumin: 2.8 g/dL — ABNORMAL LOW (ref 3.5–5.0)
Alkaline Phosphatase: 63 U/L (ref 38–126)
Anion gap: 8 (ref 5–15)
BUN: 40 mg/dL — ABNORMAL HIGH (ref 8–23)
CO2: 24 mmol/L (ref 22–32)
Calcium: 8.9 mg/dL (ref 8.9–10.3)
Chloride: 103 mmol/L (ref 98–111)
Creatinine, Ser: 1.18 mg/dL — ABNORMAL HIGH (ref 0.44–1.00)
GFR calc Af Amer: 46 mL/min — ABNORMAL LOW (ref >60)
GFR calc non Af Amer: 40 mL/min — ABNORMAL LOW (ref >60)
Glucose, Bld: 116 mg/dL — ABNORMAL HIGH (ref 70–99)
Potassium: 4.3 mmol/L (ref 3.5–5.1)
Sodium: 135 mmol/L (ref 135–145)
Total Bilirubin: 0.3 mg/dL (ref 0.3–1.2)
Total Protein: 6.3 g/dL — ABNORMAL LOW (ref 6.5–8.1)

## 2018-06-18 LAB — MAGNESIUM: Magnesium: 2.1 mg/dL (ref 1.7–2.4)

## 2018-06-18 LAB — TROPONIN I
Troponin I: <0.03 ng/mL (ref <0.03)
Troponin I: <0.03 ng/mL (ref <0.03)

## 2018-06-18 LAB — PHOSPHORUS: Phosphorus: 3.3 mg/dL (ref 2.5–4.6)

## 2018-06-18 LAB — ECHOCARDIOGRAM COMPLETE
Height: 67 in
Weight: 1929.47 oz

## 2018-06-18 LAB — TSH: TSH: 7.695 u[IU]/mL — ABNORMAL HIGH (ref 0.350–4.500)

## 2018-06-18 MED ORDER — HYDROCORTISONE ACETATE 25 MG RE SUPP
25.0000 mg | Freq: Two times a day (BID) | RECTAL | Status: DC
  Administered 2018-06-18 – 2018-06-22 (×5): 25 mg via RECTAL
  Filled 2018-06-18 (×8): qty 1

## 2018-06-18 MED ORDER — POLYETHYLENE GLYCOL 3350 17 G PO PACK
17.0000 g | PACK | Freq: Two times a day (BID) | ORAL | Status: DC
  Administered 2018-06-18 – 2018-06-22 (×8): 17 g via ORAL
  Filled 2018-06-18 (×8): qty 1

## 2018-06-18 MED ORDER — BISACODYL 5 MG PO TBEC
5.0000 mg | DELAYED_RELEASE_TABLET | Freq: Every day | ORAL | Status: DC
  Administered 2018-06-18 – 2018-06-22 (×5): 5 mg via ORAL
  Filled 2018-06-18 (×5): qty 1

## 2018-06-18 MED ORDER — DEXTROSE-NACL 5-0.9 % IV SOLN
INTRAVENOUS | Status: DC
  Administered 2018-06-18 – 2018-06-19 (×2): via INTRAVENOUS

## 2018-06-18 NOTE — Progress Notes (Signed)
Carotid duplex has been completed.   Preliminary results in CV Proc.   Abram Sander 06/18/2018 10:33 AM

## 2018-06-18 NOTE — Progress Notes (Addendum)
[name]  PROGRESS NOTE    Victoria Patrick  XVQ:[id number] DOB: Dec 18, 1922 DOA: 06/17/2018 PCP: Deland Pretty, MD    Brief Narrative:  85 year old female who presented after a syncope episode.  She does have significant past medical history of mild dementia, B12 deficiency, bladder cancer status post urostomy, gastrointestinal stromal tumor, hypothyroidism and hypertension.  Patient suffered a syncope while waiting in line at a [employer].  Patient had a sudden onset of loss of consciousness while standing.  No prodromal symptoms.  On her initial physical examination blood pressure 129/62, heart rate 82, respiratory rate 17, temperature 98, oxygen saturation 98%.  She had dry mucous membranes, her lungs were clear to auscultation bilaterally, heart S1-S2 present rhythm, abdomen soft, no lower extremity edema.  Sodium 138, potassium 4.7, chloride 101, bicarb 26, glucose 127, BUN 45, creatinine 1.56, white count 13.6, hemoglobin 9.9, hematocrit 38.4, platelets clumped.  SARS COVID-19 was negative.  Urinalysis 21-50 white cells.  CT of the head and cervical spine with no acute changes.  Chest radiograph with no infiltrates.  EKG 82 bpm, left axis deviation, sinus rhythm, no ST segment changes or T wave normalities.  Poor R wave progression.  Patient was admitted to the hospital working diagnosis of syncope  Assessment & Plan:   Active Problems:   Bladder cancer ([facility])   Weakness generalized   Essential hypertension   Hypothyroidism   Memory disorder   Leukocytosis   Syncope   AKI (acute kidney injury) ([place])   Dehydration   Acute lower UTI   Obstipation   1. Syncope/ orthostatic syncope. Positive orthostatic hypotension on admission with systolic blood pressure down to 84 on supine from 108 lying, HR 93 supine from 75 lying. Patient does not recall the events prior or post syncope event, she does not remember loosing her conscience. She does have evident ambulatory dysfunction and on admission her  serum cr was elevated. Positive constipation. Her systolic blood pressure this am was 88 mmHg. Will continue hydration with isotonic saline and will check orthostatic vital signs. Her EKG has no QTc prolongation or brugada criteria, echocardiogram with preserved LV systolic function and no valvular disease. Doubt cardiac syncope. Will continue telemetry monitoring for now.  2. AKI. Renal function with serum cr down to 1,18 with K at 4,3 and serum bicarbonate at 24, patient continue to be very weak and with significant ambulatory dysfunction, not back to her baseline. Will continue with IV fluids. Follow on renal panel in am.   2. HTN. Continue blood pressure monitoring, at home on losartan, that now is on hold. She was orthostatic on admission.   3. Hypothyroid. Continue levothyroxine.  4. Constipation. Continue aggressive bowel regimen and will add topical steroids for hemorrhoids.   5. Cognitive impairment. Will follow with physical therapy recommendations.   6. UTI. Unclear urinary symptoms due to cognitive impairment, will follow with urine culture, will plan for 3 day course antibiotic therapy, continue with ceftriaxone.   DVT prophylaxis: enoxaparin   Code Status: dnr  Family Communication: no family at the bedside  Disposition Plan/ discharge barriers: pending clinical improvement   Body mass index is 18.89 kg/m. Malnutrition Type:      Malnutrition Characteristics:      Nutrition Interventions:     RN Pressure Injury Documentation:     Consultants:     Procedures:     Antimicrobials:       Subjective: Patient feeling very weak and deconditioned, continue to be constipated, no nausea or  vomiting.   Objective: Vitals:   06/18/18 0134 06/18/18 0546 06/18/18 1003 06/18/18 1411  BP: (!) 88/50 (!) 97/51 (!) 112/50 (!) 120/52  Pulse: 71 68 70 75  Resp: 16 18 18 17   Temp: 98.6 F (37 C) 98.2 F (36.8 C) 98 F (36.7 C) 97.8 F (36.6 C)  TempSrc: Oral  Oral Oral Oral  SpO2: 98% 97% 96% 99%  Weight:      Height:        Intake/Output Summary (Last 24 hours) at 06/18/2018 1428 Last data filed at 06/18/2018 1000 Gross per 24 hour  Intake 834.88 ml  Output 740 ml  Net 94.88 ml   Filed Weights   06/17/18 2134  Weight: 54.7 kg    Examination:   General: deconditioned and ill looking appearing  Neurology: Awake and alert, non focal. Confused and disorientated E ENT: mild pallor, no icterus, oral mucosa dry.  Cardiovascular: No JVD. S1-S2 present, rhythmic, no gallops, rubs, or murmurs. No lower extremity edema. Pulmonary: positive breath sounds bilaterally, adequate air movement, no wheezing, rhonchi or rales. Gastrointestinal. Abdomen with no organomegaly, non tender, no rebound or guarding Skin. No rashes Musculoskeletal: no joint deformities     Data Reviewed: I have personally reviewed following labs and imaging studies  CBC: Recent Labs  Lab 06/17/18 1634 06/18/18 0310  WBC 13.6* 8.7  NEUTROABS 11.9*  --   HGB 11.9* 9.7*  HCT 38.4 31.6*  MCV 91.0 91.1  PLT PLATELET CLUMPS NOTED ON SMEAR, UNABLE TO ESTIMATE PLATELET CLUMPS NOTED ON SMEAR, UNABLE TO ESTIMATE   Basic Metabolic Panel: Recent Labs  Lab 06/17/18 1634 06/17/18 2153 06/18/18 0310  NA 138  --  135  K 4.7  --  4.3  CL 101  --  103  CO2 26  --  24  GLUCOSE 127*  --  116*  BUN 45*  --  40*  CREATININE 1.56*  --  1.18*  CALCIUM 10.3  --  8.9  MG  --  2.3 2.1  PHOS  --   --  3.3   GFR: Estimated Creatinine Clearance: 25.7 mL/min (A) (by C-G formula based on SCr of 1.18 mg/dL (H)). Liver Function Tests: Recent Labs  Lab 06/17/18 1634 06/18/18 0310  AST 16 14*  ALT 11 9  ALKPHOS 81 63  BILITOT 0.9 0.3  PROT 8.0 6.3*  ALBUMIN 3.9 2.8*   No results for input(s): LIPASE, AMYLASE in the last 168 hours. No results for input(s): AMMONIA in the last 168 hours. Coagulation Profile: Recent Labs  Lab 06/17/18 1634  INR 1.0   Cardiac Enzymes:  Recent Labs  Lab 06/17/18 1634 06/17/18 2153 06/18/18 0310 06/18/18 0904  TROPONINI <0.03 <0.03 <0.03 <0.03   BNP (last 3 results) No results for input(s): PROBNP in the last 8760 hours. HbA1C: No results for input(s): HGBA1C in the last 72 hours. CBG: No results for input(s): GLUCAP in the last 168 hours. Lipid Profile: No results for input(s): CHOL, HDL, LDLCALC, TRIG, CHOLHDL, LDLDIRECT in the last 72 hours. Thyroid Function Tests: Recent Labs    06/18/18 0310  TSH 7.695*   Anemia Panel: No results for input(s): VITAMINB12, FOLATE, FERRITIN, TIBC, IRON, RETICCTPCT in the last 72 hours.    Radiology Studies: I have reviewed all of the imaging during this hospital visit personally     Scheduled Meds: . enoxaparin (LOVENOX) injection  30 mg Subcutaneous Q24H  . levothyroxine  88 mcg Oral Q0600  . milk and molasses  1  enema Rectal Once  . senna  1 tablet Oral BID   Continuous Infusions: . cefTRIAXone (ROCEPHIN)  IV Stopped (06/17/18 2101)     LOS: 0 days        Jyra Lagares Gerome Apley, MD

## 2018-06-18 NOTE — Evaluation (Signed)
Occupational Therapy Evaluation Patient Details Name: Victoria Patrick MRN: [id number] DOB: November 30, 1922 Today's Date: 06/18/2018    History of Present Illness 85 y.o. female with medical history significant of mild dementia, B12 deficiency, bladder cancer s/p urostomy, gastrointestinal stromal tumor, hypothyroidism, HTN and admitted for syncope episode.   Clinical Impression   Pt admitted with syncopal episode. Pt currently with functional limitations due to the deficits listed below (see OT Problem List).  Pt will benefit from skilled OT to increase their safety and independence with ADL and functional mobility for ADL to facilitate discharge to venue listed below.      Follow Up Recommendations  SNF    Equipment Recommendations  None recommended by OT    Recommendations for Other Services       Precautions / Restrictions Precautions Precautions: Fall      Mobility Bed Mobility Overal bed mobility: Needs Assistance Bed Mobility: Supine to Sit;Sit to Supine     Supine to sit: Min assist Sit to supine: Min assist      Transfers Overall transfer level: Needs assistance Equipment used: Rolling walker (2 wheeled) Transfers: Sit to/from [facility] Sit to Stand: Min assist Stand pivot transfers: Min assist       General transfer comment: Vc for sequencing    Balance Overall balance assessment: Mild deficits observed, not formally tested(pt does not remember falls (?))                                         ADL either performed or assessed with clinical judgement   ADL Overall ADL's : Needs assistance/impaired Eating/Feeding: Set up;Sitting   Grooming: Minimal assistance;Standing   Upper Body Bathing: Minimal assistance;Sitting   Lower Body Bathing: Maximal assistance;Sit to/from stand;Cueing for sequencing;Cueing for safety   Upper Body Dressing : Set up;Sitting   Lower Body Dressing: Maximal assistance;Sit to/from  stand;Cueing for sequencing;Cueing for safety   Toilet Transfer: Moderate assistance;Cueing for sequencing;Cueing for safety;Ambulation;RW   Toileting- Clothing Manipulation and Hygiene: Moderate assistance;Sit to/from stand;Cueing for safety;Cueing for sequencing       Functional mobility during ADLs: Moderate assistance;Cueing for sequencing;Cueing for safety       Vision Patient Visual Report: No change from baseline              Pertinent Vitals/Pain Pain Assessment: Faces Pain Score: 4  Pain Location: L groin Pain Intervention(s): Repositioned;Monitored during session     Hand Dominance     Extremity/Trunk Assessment Upper Extremity Assessment Upper Extremity Assessment: Generalized weakness           Communication Communication Communication: HOH   Cognition Arousal/Alertness: Awake/alert Behavior During Therapy: WFL for tasks assessed/performed Overall Cognitive Status: History of cognitive impairments - at baseline                                                Home Living Family/patient expects to be discharged to:: Other (Comment)(per chart, possible ALF in [facility]) Living Arrangements: Alone                               Additional Comments: pt is from home alone, hx of dementia, per chart review - daughter in law is looking  for AL[facility] in [facility]      Prior Functioning/Environment Level of Independence: Independent                 OT Problem List: Decreased strength;Decreased activity tolerance;Impaired balance (sitting and/or standing);Decreased safety awareness;Decreased knowledge of use of DME or AE      OT Treatment/Interventions: Self-care/ADL training;Patient/family education;Therapeutic activities;DME and/or AE instruction    OT Goals(Current goals can be found in the care plan section) Acute Rehab OT Goals Patient Stated Goal: have a bowel movement OT Goal Formulation: With patient Time For Goal Achievement:  07/02/18  OT Frequency: Min 2X/week    AM-PAC OT "6 Clicks" Daily Activity     Outcome Measure Help from another person eating meals?: A Little Help from another person taking care of personal grooming?: A Little Help from another person toileting, which includes using toliet, bedpan, or urinal?: A Lot Help from another person bathing (including washing, rinsing, drying)?: A Lot Help from another person to put on and taking off regular upper body clothing?: A Little Help from another person to put on and taking off regular lower body clothing?: A Lot 6 Click Score: 15   End of Session Equipment Utilized During Treatment: Rolling walker;Gait belt Nurse Communication: Mobility status  Activity Tolerance: Patient tolerated treatment well Patient left: in bed;with call bell/phone within reach  OT Visit Diagnosis: Unsteadiness on feet (R26.81);Muscle weakness (generalized) (M62.81);Repeated falls (R29.6);History of falling (Z91.81)                Time: 1350-1404 OT Time Calculation (min): 14 min Charges:  OT General Charges $OT Visit: 1 Visit OT Evaluation $OT Eval Moderate Complexity: 1 Mod  Victoria Patrick, OT Acute Rehabilitation Services [phone] Office- [phone]     Victoria Patrick, Victoria Patrick 06/18/2018, 5:06 PM

## 2018-06-18 NOTE — Evaluation (Signed)
Physical Therapy Evaluation Patient Details Name: Victoria Patrick MRN: [id number] DOB: [birth year] Today's Date: 06/18/2018   History of Present Illness  85 y.o. female with medical history significant of mild dementia, B12 deficiency, bladder cancer s/p urostomy, gastrointestinal stromal tumor, hypothyroidism, HTN and admitted for syncope episode.  Clinical Impression  Pt admitted with above diagnosis. Pt currently with functional limitations due to the deficits listed below (see PT Problem List).  Pt will benefit from skilled PT to increase their independence and safety with mobility to allow discharge to the venue listed below.  Pt assisted with ambulating in hallway and reports L hip/buttocks pain with mobility.  Pt repositioned in recliner to comfort end of session.  Pt with hx of mild dementia and presents with short term memory deficits.  Per chart review, daughter in law is searching for ALF in [facility] for pt.  Recommend 24/7 assist upon d/c for safety.     Follow Up Recommendations Home health PT;Supervision/Assistance - 24 hour    Equipment Recommendations  Rolling walker with 5" wheels    Recommendations for Other Services       Precautions / Restrictions Precautions Precautions: Fall Restrictions Weight Bearing Restrictions: No      Mobility  Bed Mobility Overal bed mobility: Needs Assistance Bed Mobility: Supine to Sit     Supine to sit: Supervision        Transfers Overall transfer level: Needs assistance Equipment used: 1 person hand held assist Transfers: Sit to/from Stand Sit to Stand: Min assist         General transfer comment: assist to rise and steady  Ambulation/Gait Ambulation/Gait assistance: Min assist;Min guard Gait Distance (Feet): 100 Feet Assistive device: Rolling walker (2 wheeled) Gait Pattern/deviations: Step-through pattern;Decreased stride length;Antalgic     General Gait Details: slightly antalgic gait as pt reports L hip/buttocks  pain, min assist initially due to pain and pt needing UE support so RW provided after a couple steps and stability improved  Stairs            Wheelchair Mobility    Modified Rankin (Stroke Patients Only)       Balance Overall balance assessment: Mild deficits observed, not formally tested(pt does not remember falls (?))                                           Pertinent Vitals/Pain Pain Assessment: Faces Faces Pain Scale: Hurts little more Pain Location: L hip/buttocks Pain Descriptors / Indicators: Tender;Discomfort Pain Intervention(s): Monitored during session;Repositioned    Home Living Family/patient expects to be discharged to:: Other (Comment)(per chart, possible ALF in [facility]) Living Arrangements: Alone               Additional Comments: pt is from home alone, hx of dementia, per chart review - daughter in law is looking for AL[facility] in [facility]    Prior Function Level of Independence: Independent               Hand Dominance        Extremity/Trunk Assessment        Lower Extremity Assessment Lower Extremity Assessment: Generalized weakness       Communication   Communication: HOH  Cognition Arousal/Alertness: Awake/alert Behavior During Therapy: WFL for tasks assessed/performed Overall Cognitive Status: History of cognitive impairments - at baseline  General Comments: pt presents with decreased short term memory, asked the same questions multiple times      General Comments      Exercises     Assessment/Plan    PT Assessment Patient needs continued PT services  PT Problem List Decreased strength;Decreased balance;Decreased activity tolerance;Decreased knowledge of use of DME;Decreased knowledge of precautions;Decreased cognition;Decreased mobility       PT Treatment Interventions DME instruction;Functional mobility training;Balance training;Patient/family education;Gait  training;Therapeutic exercise;Stair training;Therapeutic activities;Neuromuscular re-education    PT Goals (Current goals can be found in the Care Plan section)  Acute Rehab PT Goals PT Goal Formulation: With patient Time For Goal Achievement: 07/02/18 Potential to Achieve Goals: Good    Frequency Min 3X/week   Barriers to discharge        Co-evaluation               AM-PAC PT "6 Clicks" Mobility  Outcome Measure Help needed turning from your back to your side while in a flat bed without using bedrails?: A Little Help needed moving from lying on your back to sitting on the side of a flat bed without using bedrails?: A Little Help needed moving to and from a bed to a chair (including a wheelchair)?: A Little Help needed standing up from a chair using your arms (e.g., wheelchair or bedside chair)?: A Little Help needed to walk in hospital room?: A Little Help needed climbing 3-5 steps with a railing? : A Little 6 Click Score: 18    End of Session Equipment Utilized During Treatment: Gait belt Activity Tolerance: Patient tolerated treatment well Patient left: in chair;with call bell/phone within reach;with chair alarm set   PT Visit Diagnosis: Other abnormalities of gait and mobility (R26.89)    Time: 1194-1740 PT Time Calculation (min) (ACUTE ONLY): 12 min   Charges:   PT Evaluation $PT Eval Low Complexity: Murray, PT, DPT Acute Rehabilitation Services Office: [phone] Pager: [phone]  Trena Platt 06/18/2018, 12:30 PM

## 2018-06-18 NOTE — Progress Notes (Signed)
I spoke with patient's nice, Victoria Patrick, over the phone, I give her an update on Victoria Patrick condition. Apparently patient has been rapidly deteriorating at home, with decrease functional capacity and poor oral intake. She would like her aunt to be discharge to SNF when medically stable.

## 2018-06-18 NOTE — Plan of Care (Signed)
  Problem: Clinical Measurements: Goal: Diagnostic test results will improve Outcome: Progressing Goal: Respiratory complications will improve Outcome: Progressing Goal: Cardiovascular complication will be avoided Outcome: Progressing   Problem: Activity: Goal: Risk for activity intolerance will decrease Outcome: Progressing   Problem: Elimination: Goal: Will not experience complications related to bowel motility Outcome: Progressing Goal: Will not experience complications related to urinary retention Outcome: Progressing   Problem: Pain Managment: Goal: General experience of comfort will improve Outcome: Progressing   Problem: Safety: Goal: Ability to remain free from injury will improve Outcome: Progressing   Problem: Skin Integrity: Goal: Risk for impaired skin integrity will decrease Outcome: Progressing

## 2018-06-18 NOTE — Progress Notes (Signed)
Patient given a milk and molasses enema per MD order. Prior to giving the enema it was noted that the patient had external hemorrhoids. Patient tolerated enema well and was able to retain the fluid for about 10 minutes. She had a small amount of stool after releasing the enema fluid. Noted bright red blood in stool and noted that the hemorrhoids were now very swollen and inflamed. MD came to bedside to round on patient and he was made aware.   Since the enema, patient has had several small, soft BMs.

## 2018-06-19 DIAGNOSIS — R413 Other amnesia: Secondary | ICD-10-CM

## 2018-06-19 DIAGNOSIS — N179 Acute kidney failure, unspecified: Secondary | ICD-10-CM

## 2018-06-19 DIAGNOSIS — I10 Essential (primary) hypertension: Secondary | ICD-10-CM

## 2018-06-19 DIAGNOSIS — R531 Weakness: Secondary | ICD-10-CM

## 2018-06-19 DIAGNOSIS — E039 Hypothyroidism, unspecified: Secondary | ICD-10-CM

## 2018-06-19 DIAGNOSIS — N390 Urinary tract infection, site not specified: Secondary | ICD-10-CM

## 2018-06-19 LAB — CBC WITH DIFFERENTIAL/PLATELET
Abs Immature Granulocytes: 0.04 10*3/uL (ref 0.00–0.07)
Basophils Absolute: 0 10*3/uL (ref 0.0–0.1)
Basophils Relative: 0 %
Eosinophils Absolute: 0.1 10*3/uL (ref 0.0–0.5)
Eosinophils Relative: 1 %
HCT: 30.8 % — ABNORMAL LOW (ref 36.0–46.0)
Hemoglobin: 9.6 g/dL — ABNORMAL LOW (ref 12.0–15.0)
Immature Granulocytes: 1 %
Lymphocytes Relative: 13 %
Lymphs Abs: 1 10*3/uL (ref 0.7–4.0)
MCH: 28.5 pg (ref 26.0–34.0)
MCHC: 31.2 g/dL (ref 30.0–36.0)
MCV: 91.4 fL (ref 80.0–100.0)
Monocytes Absolute: 0.9 10*3/uL (ref 0.1–1.0)
Monocytes Relative: 11 %
Neutro Abs: 5.8 10*3/uL (ref 1.7–7.7)
Neutrophils Relative %: 74 %
Platelets: UNDETERMINED 10*3/uL (ref 150–400)
RBC: 3.37 MIL/uL — ABNORMAL LOW (ref 3.87–5.11)
RDW: 12.2 % (ref 11.5–15.5)
WBC: 7.8 10*3/uL (ref 4.0–10.5)
nRBC: 0 % (ref 0.0–0.2)

## 2018-06-19 LAB — BASIC METABOLIC PANEL
Anion gap: 6 (ref 5–15)
BUN: 28 mg/dL — ABNORMAL HIGH (ref 8–23)
CO2: 23 mmol/L (ref 22–32)
Calcium: 8.4 mg/dL — ABNORMAL LOW (ref 8.9–10.3)
Chloride: 106 mmol/L (ref 98–111)
Creatinine, Ser: 1.06 mg/dL — ABNORMAL HIGH (ref 0.44–1.00)
GFR calc Af Amer: 52 mL/min — ABNORMAL LOW (ref >60)
GFR calc non Af Amer: 45 mL/min — ABNORMAL LOW (ref >60)
Glucose, Bld: 134 mg/dL — ABNORMAL HIGH (ref 70–99)
Potassium: 3.9 mmol/L (ref 3.5–5.1)
Sodium: 135 mmol/L (ref 135–145)

## 2018-06-19 NOTE — Progress Notes (Signed)
Occupational Therapy Treatment Patient Details Name: Victoria Patrick MRN: [id number] DOB: 12/20/1922 Today's Date: 06/19/2018    History of present illness 85 y.o. female with medical history significant of mild dementia, B12 deficiency, bladder cancer s/p urostomy, gastrointestinal stromal tumor, hypothyroidism, HTN and admitted for syncope episode.   OT comments  Pt very pleasant and agreeable.  Pts cognition clearer this day. Plan if SNF or ALF   Follow Up Recommendations  SNF    Equipment Recommendations  None recommended by OT    Recommendations for Other Services      Precautions / Restrictions Precautions Precautions: Fall       Mobility Bed Mobility Overal bed mobility: Needs Assistance Bed Mobility: Supine to Sit;Sit to Supine     Supine to sit: Min assist Sit to supine: Min assist      Transfers Overall transfer level: Needs assistance Equipment used: Rolling walker (2 wheeled) Transfers: Sit to/from [facility] Sit to Stand: Min assist Stand pivot transfers: Min assist       General transfer comment: Vc for sequencing    Balance Overall balance assessment: Mild deficits observed, not formally tested(pt does not remember falls (?))                                         ADL either performed or assessed with clinical judgement   ADL Overall ADL's : Needs assistance/impaired                         Toilet Transfer: Cueing for sequencing;Cueing for safety;Ambulation;RW;Minimal assistance;BSC   Toileting- Clothing Manipulation and Hygiene: Moderate assistance;Sit to/from stand;Cueing for safety;Cueing for sequencing       Functional mobility during ADLs: Moderate assistance;Cueing for sequencing;Cueing for safety General ADL Comments: pt anxious about Dc plan. Pt kept saying 'what am i gonna do'  Pt recognizes she need A with ADL actiivity. Cognition seemed more clear this day     Vision Patient Visual  Report: No change from baseline            Cognition Arousal/Alertness: Awake/alert Behavior During Therapy: WFL for tasks assessed/performed Overall Cognitive Status: History of cognitive impairments - at baseline                                                     Pertinent Vitals/ Pain       Pain Assessment: No/denies pain  Home Living Family/patient expects to be discharged to:: Other (Comment) Living Arrangements: Alone                               Additional Comments: pt is from home alone, hx of dementia, per chart review - daughter in law is looking for ALF in [facility]      Prior Functioning/Environment Level of Independence: Independent        Comments: per pt   Frequency  Min 2X/week        Progress Toward Goals  OT Goals(current goals can now be found in the care plan section)     Acute Rehab OT Goals Patient Stated Goal: get well OT Goal Formulation: With patient Time For Goal  Achievement: 06/26/18  Plan         AM-PAC OT "6 Clicks" Daily Activity     Outcome Measure   Help from another person eating meals?: A Little Help from another person taking care of personal grooming?: A Little Help from another person toileting, which includes using toliet, bedpan, or urinal?: A Little Help from another person bathing (including washing, rinsing, drying)?: A Little Help from another person to put on and taking off regular upper body clothing?: A Little Help from another person to put on and taking off regular lower body clothing?: A Little 6 Click Score: 18    End of Session Equipment Utilized During Treatment: Rolling walker;Gait belt  OT Visit Diagnosis: Unsteadiness on feet (R26.81);Muscle weakness (generalized) (M62.81);Repeated falls (R29.6);History of falling (Z91.81)   Activity Tolerance Patient tolerated treatment well   Patient Left with call bell/phone within reach;in chair;with chair alarm set   Nurse  Communication Mobility status        Time: 2446-9507 OT Time Calculation (min): 20 min  Charges: OT General Charges $OT Visit: 1 Visit OT Treatments $Self Care/Home Management : 8-22 mins  Kari Baars, [place] [phone] Office- [phone]      Oran Dillenburg, Edwena Felty D 06/19/2018, 2:01 PM

## 2018-06-19 NOTE — Progress Notes (Signed)
[name]  PROGRESS NOTE    Victoria Patrick  OZD:[id number] DOB: November 14, 1922 DOA: 06/17/2018 PCP: Deland Pretty, MD    Brief Narrative:  85 year old female who presented after a syncope episode.  She does have significant past medical history of mild dementia, B12 deficiency, bladder cancer status post urostomy, gastrointestinal stromal tumor, hypothyroidism and hypertension.  Patient suffered a syncope while waiting in line at a [employer].  Patient had a sudden onset of loss of consciousness while standing.  No prodromal symptoms.  On her initial physical examination blood pressure 129/62, heart rate 82, respiratory rate 17, temperature 98, oxygen saturation 98%.  She had dry mucous membranes, her lungs were clear to auscultation bilaterally, heart S1-S2 present rhythm, abdomen soft, no lower extremity edema.  Sodium 138, potassium 4.7, chloride 101, bicarb 26, glucose 127, BUN 45, creatinine 1.56, white count 13.6, hemoglobin 9.9, hematocrit 38.4, platelets clumped.  SARS COVID-19 was negative.  Urinalysis 21-50 white cells.  CT of the head and cervical spine with no acute changes.  Chest radiograph with no infiltrates.  EKG 82 bpm, left axis deviation, sinus rhythm, no ST segment changes or T wave normalities.  Poor R wave progression.  Patient was admitted to the hospital with a working diagnosis of syncope  Assessment & Plan:   Active Problems:   Bladder cancer (HCC)   Weakness generalized   Essential hypertension   Hypothyroidism   Memory disorder   Leukocytosis   Syncope   AKI (acute kidney injury) ([place])   Dehydration   Acute lower UTI   Obstipation   Syncope/ orthostatic syncope Positive orthostatic hypotension on admission ECHO with preserved LV systolic function, no valvular disease Carotid ultrasounds bilateral, no significant abnormality Continue IV hydration Daily orthostatic vital signs Continue telemetry PT/OT  AKI Improving  Continue with IV fluids Daily renal panel   UTI Unclear urinary symptoms due to cognitive impairment Urine culture grew greater than 100,000 Proteus mirabilis, awaiting sensitivity  Continue with ceftriaxone  HTN Stable  Continue to hold losartan due to orthostasis  Hypothyroid Continue levothyroxine.  Constipation Continue aggressive bowel regimen, topical steroids for hemorrhoids.   Cognitive impairment PT/OT recommend SNF or ALF    DVT prophylaxis: enoxaparin   Code Status: dnr  Family Communication: no family at the bedside  Disposition Plan/ discharge barriers: Likely SNF/ALF  Body mass index is 18.89 kg/m. Malnutrition Type:      Malnutrition Characteristics:      Nutrition Interventions:     RN Pressure Injury Documentation:     Consultants:   None  Procedures:   None  Antimicrobials:   Ceftriaxone   Subjective: Patient reports feeling somewhat better, continues to still be weak.  No new complaints  Objective: Vitals:   06/19/18 0615 06/19/18 0937 06/19/18 1441 06/19/18 1721  BP: (!) 106/46 (!) 107/49 120/60 127/60  Pulse: 69 74 69 73  Resp: 18 20 16 18   Temp: 98.6 F (37 C) 98.1 F (36.7 C) 98.4 F (36.9 C) 98.1 F (36.7 C)  TempSrc: Oral Oral Oral Oral  SpO2: 98% 100% 99% 98%  Weight:      Height:        Intake/Output Summary (Last 24 hours) at 06/19/2018 1901 Last data filed at 06/19/2018 1500 Gross per 24 hour  Intake 1929.78 ml  Output 1850 ml  Net 79.78 ml   Filed Weights   06/17/18 2134  Weight: 54.7 kg    Examination:   General: NAD   Cardiovascular: S1, S2 present  Respiratory: CTAB  Abdomen: Soft, nontender, nondistended, bowel sounds present  Musculoskeletal: No bilateral pedal edema noted  Skin: Normal  Psychiatry: Normal mood     Data Reviewed: I have personally reviewed following labs and imaging studies  CBC: Recent Labs  Lab 06/17/18 1634 06/18/18 0310 06/19/18 0602  WBC 13.6* 8.7 7.8  NEUTROABS 11.9*  --  5.8  HGB  11.9* 9.7* 9.6*  HCT 38.4 31.6* 30.8*  MCV 91.0 91.1 91.4  PLT PLATELET CLUMPS NOTED ON SMEAR, UNABLE TO ESTIMATE PLATELET CLUMPS NOTED ON SMEAR, UNABLE TO ESTIMATE PLATELET CLUMPS NOTED ON SMEAR, UNABLE TO ESTIMATE   Basic Metabolic Panel: Recent Labs  Lab 06/17/18 1634 06/17/18 2153 06/18/18 0310 06/19/18 0602  NA 138  --  135 135  K 4.7  --  4.3 3.9  CL 101  --  103 106  CO2 26  --  24 23  GLUCOSE 127*  --  116* 134*  BUN 45*  --  40* 28*  CREATININE 1.56*  --  1.18* 1.06*  CALCIUM 10.3  --  8.9 8.4*  MG  --  2.3 2.1  --   PHOS  --   --  3.3  --    GFR: Estimated Creatinine Clearance: 28.6 mL/min (A) (by C-G formula based on SCr of 1.06 mg/dL (H)). Liver Function Tests: Recent Labs  Lab 06/17/18 1634 06/18/18 0310  AST 16 14*  ALT 11 9  ALKPHOS 81 63  BILITOT 0.9 0.3  PROT 8.0 6.3*  ALBUMIN 3.9 2.8*   No results for input(s): LIPASE, AMYLASE in the last 168 hours. No results for input(s): AMMONIA in the last 168 hours. Coagulation Profile: Recent Labs  Lab 06/17/18 1634  INR 1.0   Cardiac Enzymes: Recent Labs  Lab 06/17/18 1634 06/17/18 2153 06/18/18 0310 06/18/18 0904  TROPONINI <0.03 <0.03 <0.03 <0.03   BNP (last 3 results) No results for input(s): PROBNP in the last 8760 hours. HbA1C: No results for input(s): HGBA1C in the last 72 hours. CBG: No results for input(s): GLUCAP in the last 168 hours. Lipid Profile: No results for input(s): CHOL, HDL, LDLCALC, TRIG, CHOLHDL, LDLDIRECT in the last 72 hours. Thyroid Function Tests: Recent Labs    06/18/18 0310  TSH 7.695*   Anemia Panel: No results for input(s): VITAMINB12, FOLATE, FERRITIN, TIBC, IRON, RETICCTPCT in the last 72 hours.    Radiology Studies: I have reviewed all of the imaging during this hospital visit personally     Scheduled Meds: . bisacodyl  5 mg Oral Daily  . enoxaparin (LOVENOX) injection  30 mg Subcutaneous Q24H  . hydrocortisone  25 mg Rectal BID  .  levothyroxine  88 mcg Oral Q0600  . polyethylene glycol  17 g Oral BID  . senna  1 tablet Oral BID   Continuous Infusions: . cefTRIAXone (ROCEPHIN)  IV Stopped (06/18/18 2055)  . dextrose 5 % and 0.9% NaCl 75 mL/hr at 06/19/18 1500     LOS: 1 day        Alma Friendly, MD

## 2018-06-20 ENCOUNTER — Encounter (HOSPITAL_COMMUNITY): Payer: Self-pay | Admitting: *Deleted

## 2018-06-20 LAB — URINE CULTURE: Culture: >=100000 — AB

## 2018-06-20 LAB — BASIC METABOLIC PANEL
Anion gap: 10 (ref 5–15)
BUN: 18 mg/dL (ref 8–23)
CO2: 22 mmol/L (ref 22–32)
Calcium: 8 mg/dL — ABNORMAL LOW (ref 8.9–10.3)
Chloride: 104 mmol/L (ref 98–111)
Creatinine, Ser: 0.99 mg/dL (ref 0.44–1.00)
GFR calc Af Amer: 57 mL/min — ABNORMAL LOW (ref >60)
GFR calc non Af Amer: 49 mL/min — ABNORMAL LOW (ref >60)
Glucose, Bld: 112 mg/dL — ABNORMAL HIGH (ref 70–99)
Potassium: 3.7 mmol/L (ref 3.5–5.1)
Sodium: 136 mmol/L (ref 135–145)

## 2018-06-20 MED ORDER — CEPHALEXIN 500 MG PO CAPS
500.0000 mg | ORAL_CAPSULE | Freq: Two times a day (BID) | ORAL | Status: AC
  Administered 2018-06-20 – 2018-06-22 (×4): 500 mg via ORAL
  Filled 2018-06-20 (×4): qty 1

## 2018-06-20 NOTE — NC FL2 (Signed)
[place] LEVEL OF CARE SCREENING TOOL     IDENTIFICATION  Patient Name: Victoria Patrick Birthdate: February 20, 1922 Sex: female Admission Date (Current Location): 06/17/2018  [facility] and [place] Number:  Herbalist and Address:  [facility],  [place], [place]      Provider Number: [id number]  Attending Physician Name and Address:  Alma Friendly, MD  Relative Name and Phone Number:  Jyl Heinz [phone]    Current Level of Care: Hospital Recommended Level of Care: [place] Prior Approval Number:    Date Approved/Denied:   PASRR Number: [id number]  Discharge Plan: SNF    Current Diagnoses: Patient Active Problem List   Diagnosis Date Noted  . Leukocytosis 06/17/2018  . Syncope 06/17/2018  . AKI (acute kidney injury) ([place]) 06/17/2018  . Dehydration 06/17/2018  . Acute lower UTI 06/17/2018  . Obstipation 06/17/2018  . Hydronephrosis of left kidney 09/07/2017  . Memory disorder 08/30/2017  . Gastric mass   . Palliative care by specialist 09/02/2014  . DNR (do not resuscitate) discussion 09/02/2014  . Delirium 08/31/2014  . Weakness generalized 08/31/2014  . Hypercalcemia 08/31/2014  . UTI (urinary tract infection) 08/31/2014  . Essential hypertension 08/31/2014  . Hypothyroidism 08/31/2014  . Weakness 08/31/2014  . Bladder cancer ([place]) 04/30/2014    Orientation RESPIRATION BLADDER Height & Weight     Self, Time  Normal Urostomy Weight: 54.7 kg Height:  5\' 7"  (170.2 cm)  BEHAVIORAL SYMPTOMS/MOOD NEUROLOGICAL BOWEL NUTRITION STATUS      Continent Diet(Heart Healthy)  AMBULATORY STATUS COMMUNICATION OF NEEDS Skin   Limited Assist(PT x5, OT x5) Verbally Normal                       Personal Care Assistance Level of Assistance  Bathing, Dressing Bathing Assistance: Limited assistance Feeding assistance: Independent Dressing Assistance: Limited assistance     Functional  Limitations Info             SPECIAL CARE FACTORS FREQUENCY                       Contractures Contractures Info: Not present    Additional Factors Info  Code Status Code Status Info: DNR             Current Medications (06/20/2018):  This is the current hospital active medication list Current Facility-Administered Medications  Medication Dose Route Frequency Provider Last Rate Last Dose  . acetaminophen (TYLENOL) tablet 650 mg  650 mg Oral Q6H PRN Toy Baker, MD       Or  . acetaminophen (TYLENOL) suppository 650 mg  650 mg Rectal Q6H PRN Doutova, Anastassia, MD      . bisacodyl (DULCOLAX) EC tablet 5 mg  5 mg Oral Daily Arrien, Jimmy Picket, MD   5 mg at 06/20/18 0949  . cefTRIAXone (ROCEPHIN) 1 g in sodium chloride 0.9 % 100 mL IVPB  1 g Intravenous Q24H Doutova, Anastassia, MD 200 mL/hr at 06/19/18 2119 1 g at 06/19/18 2119  . enoxaparin (LOVENOX) injection 30 mg  30 mg Subcutaneous Q24H Doutova, Anastassia, MD   30 mg at 06/18/18 2109  . hydrocortisone (ANUSOL-HC) suppository 25 mg  25 mg Rectal BID Tawni Millers, MD   25 mg at 06/20/18 0949  . levothyroxine (SYNTHROID) tablet 88 mcg  88 mcg Oral Q0600 Toy Baker, MD   88 mcg at 06/20/18 0528  . ondansetron (ZOFRAN) tablet  4 mg  4 mg Oral Q6H PRN Toy Baker, MD       Or  . ondansetron (ZOFRAN) injection 4 mg  4 mg Intravenous Q6H PRN Doutova, Anastassia, MD      . polyethylene glycol (MIRALAX / GLYCOLAX) packet 17 g  17 g Oral BID Tawni Millers, MD   17 g at 06/20/18 0949  . senna (SENOKOT) tablet 8.6 mg  1 tablet Oral BID Toy Baker, MD   8.6 mg at 06/20/18 6219     Discharge Medications: Please see discharge summary for a list of discharge medications.  Relevant Imaging Results:  Relevant Lab Results:   Additional Information [phone]  Purcell Mouton, RN

## 2018-06-20 NOTE — Plan of Care (Signed)
Pt is progressing to d/c

## 2018-06-20 NOTE — Progress Notes (Signed)
Urine culture sens is back for proteus. It's sens to cephalexin. Ok with Dr Horris Latino to optimize ceftriaxone to PO Keflex to finish a total of 5d.   Kelfex 500mg  IV q12 x2d  Onnie Boer, PharmD, BCIDP, AAHIVP, CPP Infectious Disease Pharmacist 06/20/2018 1:04 PM

## 2018-06-20 NOTE — Progress Notes (Signed)
[name]  PROGRESS NOTE    Victoria Patrick  ZDG:[id number] DOB: 08-09-1922 DOA: 06/17/2018 PCP: Deland Pretty, MD    Brief Narrative:  85 year old female who presented after a syncope episode.  She does have significant past medical history of mild dementia, B12 deficiency, bladder cancer status post urostomy, gastrointestinal stromal tumor, hypothyroidism and hypertension.  Patient suffered a syncope while waiting in line at a [employer].  Patient had a sudden onset of loss of consciousness while standing.  No prodromal symptoms.  On her initial physical examination blood pressure 129/62, heart rate 82, respiratory rate 17, temperature 98, oxygen saturation 98%.  She had dry mucous membranes, her lungs were clear to auscultation bilaterally, heart S1-S2 present rhythm, abdomen soft, no lower extremity edema.  Sodium 138, potassium 4.7, chloride 101, bicarb 26, glucose 127, BUN 45, creatinine 1.56, white count 13.6, hemoglobin 9.9, hematocrit 38.4, platelets clumped.  SARS COVID-19 was negative.  Urinalysis 21-50 white cells.  CT of the head and cervical spine with no acute changes.  Chest radiograph with no infiltrates.  EKG 82 bpm, left axis deviation, sinus rhythm, no ST segment changes or T wave normalities.  Poor R wave progression.  Patient was admitted to the hospital with a working diagnosis of syncope  Assessment & Plan:   Active Problems:   Bladder cancer (HCC)   Weakness generalized   Essential hypertension   Hypothyroidism   Memory disorder   Leukocytosis   Syncope   AKI (acute kidney injury) ([place])   Dehydration   Acute lower UTI   Obstipation   Syncope/ orthostatic syncope Positive orthostatic hypotension on admission ECHO with preserved LV systolic function, no valvular disease Carotid ultrasounds bilateral, no significant abnormality Daily orthostatic vital signs PT/OT  AKI Improving  Daily renal panel  UTI Unclear urinary symptoms due to cognitive impairment Urine  culture grew greater than 100,000 Proteus mirabilis  S/P ceftriaxone-->Keflex for a total of 5 days  HTN Stable  Continue to hold losartan due to orthostasis  Hypothyroid Continue levothyroxine.  Constipation Continue aggressive bowel regimen, topical steroids for hemorrhoids.   Cognitive impairment PT/OT recommend SNF or ALF    DVT prophylaxis: enoxaparin   Code Status: dnr  Family Communication: no family at the bedside  Disposition Plan/ discharge barriers: Likely SNF/ALF  Body mass index is 18.89 kg/m. Malnutrition Type:      Malnutrition Characteristics:      Nutrition Interventions:     RN Pressure Injury Documentation:     Consultants:   None  Procedures:   None  Antimicrobials:   Keflex   Subjective: No new complaints  Objective: Vitals:   06/19/18 1721 06/19/18 2100 06/20/18 0433 06/20/18 1421  BP: 127/60 110/63 (!) 119/51 (!) 119/55  Pulse: 73 75 69 73  Resp: 18   20  Temp: 98.1 F (36.7 C) 98.3 F (36.8 C) 98.9 F (37.2 C) 98.6 F (37 C)  TempSrc: Oral Oral Oral Oral  SpO2: 98% 97% 97% 98%  Weight:      Height:        Intake/Output Summary (Last 24 hours) at 06/20/2018 1934 Last data filed at 06/20/2018 1800 Gross per 24 hour  Intake 360 ml  Output 1275 ml  Net -915 ml   Filed Weights   06/17/18 2134  Weight: 54.7 kg    Examination:   General: NAD   Cardiovascular: S1, S2 present  Respiratory: CTAB  Abdomen: Soft, nontender, nondistended, bowel sounds present  Musculoskeletal: No bilateral pedal edema noted  Skin: Normal  Psychiatry: Normal mood     Data Reviewed: I have personally reviewed following labs and imaging studies  CBC: Recent Labs  Lab 06/17/18 1634 06/18/18 0310 06/19/18 0602  WBC 13.6* 8.7 7.8  NEUTROABS 11.9*  --  5.8  HGB 11.9* 9.7* 9.6*  HCT 38.4 31.6* 30.8*  MCV 91.0 91.1 91.4  PLT PLATELET CLUMPS NOTED ON SMEAR, UNABLE TO ESTIMATE PLATELET CLUMPS NOTED ON SMEAR, UNABLE  TO ESTIMATE PLATELET CLUMPS NOTED ON SMEAR, UNABLE TO ESTIMATE   Basic Metabolic Panel: Recent Labs  Lab 06/17/18 1634 06/17/18 2153 06/18/18 0310 06/19/18 0602 06/20/18 0604  NA 138  --  135 135 136  K 4.7  --  4.3 3.9 3.7  CL 101  --  103 106 104  CO2 26  --  24 23 22   GLUCOSE 127*  --  116* 134* 112*  BUN 45*  --  40* 28* 18  CREATININE 1.56*  --  1.18* 1.06* 0.99  CALCIUM 10.3  --  8.9 8.4* 8.0*  MG  --  2.3 2.1  --   --   PHOS  --   --  3.3  --   --    GFR: Estimated Creatinine Clearance: 30.7 mL/min (by C-G formula based on SCr of 0.99 mg/dL). Liver Function Tests: Recent Labs  Lab 06/17/18 1634 06/18/18 0310  AST 16 14*  ALT 11 9  ALKPHOS 81 63  BILITOT 0.9 0.3  PROT 8.0 6.3*  ALBUMIN 3.9 2.8*   No results for input(s): LIPASE, AMYLASE in the last 168 hours. No results for input(s): AMMONIA in the last 168 hours. Coagulation Profile: Recent Labs  Lab 06/17/18 1634  INR 1.0   Cardiac Enzymes: Recent Labs  Lab 06/17/18 1634 06/17/18 2153 06/18/18 0310 06/18/18 0904  TROPONINI <0.03 <0.03 <0.03 <0.03   BNP (last 3 results) No results for input(s): PROBNP in the last 8760 hours. HbA1C: No results for input(s): HGBA1C in the last 72 hours. CBG: No results for input(s): GLUCAP in the last 168 hours. Lipid Profile: No results for input(s): CHOL, HDL, LDLCALC, TRIG, CHOLHDL, LDLDIRECT in the last 72 hours. Thyroid Function Tests: Recent Labs    06/18/18 0310  TSH 7.695*   Anemia Panel: No results for input(s): VITAMINB12, FOLATE, FERRITIN, TIBC, IRON, RETICCTPCT in the last 72 hours.    Radiology Studies: I have reviewed all of the imaging during this hospital visit personally     Scheduled Meds: . bisacodyl  5 mg Oral Daily  . cephALEXin  500 mg Oral Q12H  . enoxaparin (LOVENOX) injection  30 mg Subcutaneous Q24H  . hydrocortisone  25 mg Rectal BID  . levothyroxine  88 mcg Oral Q0600  . polyethylene glycol  17 g Oral BID  . senna  1  tablet Oral BID   Continuous Infusions:    LOS: 2 days        Alma Friendly, MD

## 2018-06-21 NOTE — Progress Notes (Signed)
[name]  PROGRESS NOTE    Victoria Patrick  OYD:[id number] DOB: 1922/06/16 DOA: 06/17/2018 PCP: Deland Pretty, MD    Brief Narrative:  85 year old female who presented after a syncope episode.  She does have significant past medical history of mild dementia, B12 deficiency, bladder cancer status post urostomy, gastrointestinal stromal tumor, hypothyroidism and hypertension.  Patient suffered a syncope while waiting in line at a [employer].  Patient had a sudden onset of loss of consciousness while standing.  No prodromal symptoms.  On her initial physical examination blood pressure 129/62, heart rate 82, respiratory rate 17, temperature 98, oxygen saturation 98%.  She had dry mucous membranes, her lungs were clear to auscultation bilaterally, heart S1-S2 present rhythm, abdomen soft, no lower extremity edema.  Sodium 138, potassium 4.7, chloride 101, bicarb 26, glucose 127, BUN 45, creatinine 1.56, white count 13.6, hemoglobin 9.9, hematocrit 38.4, platelets clumped.  SARS COVID-19 was negative.  Urinalysis 21-50 white cells.  CT of the head and cervical spine with no acute changes.  Chest radiograph with no infiltrates.  EKG 82 bpm, left axis deviation, sinus rhythm, no ST segment changes or T wave normalities.  Poor R wave progression.  Patient was admitted to the hospital with a working diagnosis of syncope  Assessment & Plan:   Active Problems:   Bladder cancer (HCC)   Weakness generalized   Essential hypertension   Hypothyroidism   Memory disorder   Leukocytosis   Syncope   AKI (acute kidney injury) ([place])   Dehydration   Acute lower UTI   Obstipation   Syncope/ orthostatic syncope Positive orthostatic hypotension on admission ECHO with preserved LV systolic function, no valvular disease Carotid ultrasounds bilateral, no significant abnormality Daily orthostatic vital signs PT/OT  AKI Resolved  UTI Unclear urinary symptoms due to cognitive impairment Urine culture grew greater  than 100,000 Proteus mirabilis  S/P ceftriaxone-->Keflex for a total of 5 days  HTN Stable  Continue to hold losartan due to orthostasis  Hypothyroid Continue levothyroxine.  Constipation Continue aggressive bowel regimen, topical steroids for hemorrhoids.   Cognitive impairment PT/OT recommend SNF or ALF    DVT prophylaxis: enoxaparin   Code Status: dnr  Family Communication: no family at the bedside  Disposition Plan/ discharge barriers: Likely SNF/ALF  Body mass index is 18.89 kg/m. Malnutrition Type:      Malnutrition Characteristics:      Nutrition Interventions:     RN Pressure Injury Documentation:     Consultants:   None  Procedures:   None  Antimicrobials:   Keflex   Subjective: No new complaints  Objective: Vitals:   06/20/18 1421 06/20/18 2257 06/21/18 0458 06/21/18 1345  BP: (!) 119/55 129/69 136/80 119/67  Pulse: 73 81 76 71  Resp: 20 15 16 16   Temp: 98.6 F (37 C) 98.6 F (37 C) 98.6 F (37 C) 99 F (37.2 C)  TempSrc: Oral Oral Oral Oral  SpO2: 98% 95% 97% 98%  Weight:      Height:        Intake/Output Summary (Last 24 hours) at 06/21/2018 1523 Last data filed at 06/21/2018 1358 Gross per 24 hour  Intake -  Output 2050 ml  Net -2050 ml   Filed Weights   06/17/18 2134  Weight: 54.7 kg    Examination:  General: NAD   Cardiovascular: S1, S2 present  Respiratory: CTAB  Abdomen: Soft, nontender, nondistended, bowel sounds present  Musculoskeletal: No bilateral pedal edema noted  Skin: Normal  Psychiatry: Normal mood  Data Reviewed: I have personally reviewed following labs and imaging studies  CBC: Recent Labs  Lab 06/17/18 1634 06/18/18 0310 06/19/18 0602  WBC 13.6* 8.7 7.8  NEUTROABS 11.9*  --  5.8  HGB 11.9* 9.7* 9.6*  HCT 38.4 31.6* 30.8*  MCV 91.0 91.1 91.4  PLT PLATELET CLUMPS NOTED ON SMEAR, UNABLE TO ESTIMATE PLATELET CLUMPS NOTED ON SMEAR, UNABLE TO ESTIMATE PLATELET CLUMPS NOTED  ON SMEAR, UNABLE TO ESTIMATE   Basic Metabolic Panel: Recent Labs  Lab 06/17/18 1634 06/17/18 2153 06/18/18 0310 06/19/18 0602 06/20/18 0604  NA 138  --  135 135 136  K 4.7  --  4.3 3.9 3.7  CL 101  --  103 106 104  CO2 26  --  24 23 22   GLUCOSE 127*  --  116* 134* 112*  BUN 45*  --  40* 28* 18  CREATININE 1.56*  --  1.18* 1.06* 0.99  CALCIUM 10.3  --  8.9 8.4* 8.0*  MG  --  2.3 2.1  --   --   PHOS  --   --  3.3  --   --    GFR: Estimated Creatinine Clearance: 30.7 mL/min (by C-G formula based on SCr of 0.99 mg/dL). Liver Function Tests: Recent Labs  Lab 06/17/18 1634 06/18/18 0310  AST 16 14*  ALT 11 9  ALKPHOS 81 63  BILITOT 0.9 0.3  PROT 8.0 6.3*  ALBUMIN 3.9 2.8*   No results for input(s): LIPASE, AMYLASE in the last 168 hours. No results for input(s): AMMONIA in the last 168 hours. Coagulation Profile: Recent Labs  Lab 06/17/18 1634  INR 1.0   Cardiac Enzymes: Recent Labs  Lab 06/17/18 1634 06/17/18 2153 06/18/18 0310 06/18/18 0904  TROPONINI <0.03 <0.03 <0.03 <0.03   BNP (last 3 results) No results for input(s): PROBNP in the last 8760 hours. HbA1C: No results for input(s): HGBA1C in the last 72 hours. CBG: No results for input(s): GLUCAP in the last 168 hours. Lipid Profile: No results for input(s): CHOL, HDL, LDLCALC, TRIG, CHOLHDL, LDLDIRECT in the last 72 hours. Thyroid Function Tests: No results for input(s): TSH, T4TOTAL, FREET4, T3FREE, THYROIDAB in the last 72 hours. Anemia Panel: No results for input(s): VITAMINB12, FOLATE, FERRITIN, TIBC, IRON, RETICCTPCT in the last 72 hours.    Radiology Studies: I have reviewed all of the imaging during this hospital visit personally     Scheduled Meds: . bisacodyl  5 mg Oral Daily  . cephALEXin  500 mg Oral Q12H  . enoxaparin (LOVENOX) injection  30 mg Subcutaneous Q24H  . hydrocortisone  25 mg Rectal BID  . levothyroxine  88 mcg Oral Q0600  . polyethylene glycol  17 g Oral BID  .  senna  1 tablet Oral BID   Continuous Infusions:    LOS: 3 days        Alma Friendly, MD

## 2018-06-21 NOTE — Progress Notes (Signed)
Physical Therapy Treatment Patient Details Name: Victoria Patrick MRN: [id number] DOB: 1922-06-17 Today's Date: 06/21/2018    History of Present Illness 85 y.o. female with medical history significant of mild dementia, B12 deficiency, bladder cancer s/p urostomy, gastrointestinal stromal tumor, hypothyroidism, HTN and admitted for syncope episode.    PT Comments    Pt continues to report L hip pain with mobility however able to tolerate improved distance with ambulation.  Continue to recommend 24/7 supervision/assist upon d/c for safety.   Follow Up Recommendations  Home health PT;Supervision/Assistance - 24 hour     Equipment Recommendations  Rolling walker with 5" wheels    Recommendations for Other Services       Precautions / Restrictions Precautions Precautions: Fall Precaution Comments: urostomy    Mobility  Bed Mobility Overal bed mobility: Needs Assistance Bed Mobility: Supine to Sit     Supine to sit: Min assist     General bed mobility comments: assist for trunk  Transfers Overall transfer level: Needs assistance Equipment used: Rolling walker (2 wheeled) Transfers: Sit to/from Stand Sit to Stand: Min guard         General transfer comment: verbal cues for hand placement  Ambulation/Gait Ambulation/Gait assistance: Min guard Gait Distance (Feet): 250 Feet Assistive device: Rolling walker (2 wheeled) Gait Pattern/deviations: Step-through pattern;Decreased stride length     General Gait Details: improved gait pattern compared to previous session, pt continues to report L hip area pain, tolerated improved distance   Stairs             Wheelchair Mobility    Modified Rankin (Stroke Patients Only)       Balance                                            Cognition Arousal/Alertness: Awake/alert Behavior During Therapy: WFL for tasks assessed/performed Overall Cognitive Status: History of cognitive impairments - at  baseline                                 General Comments: pt presents with decreased short term memory, asked the same questions multiple times      Exercises      General Comments        Pertinent Vitals/Pain Pain Assessment: Faces Faces Pain Scale: Hurts little more Pain Location: L hip Pain Descriptors / Indicators: Tender;Discomfort Pain Intervention(s): Monitored during session;Repositioned    Home Living                      Prior Function            PT Goals (current goals can now be found in the care plan section) Progress towards PT goals: Progressing toward goals    Frequency    Min 3X/week      PT Plan Current plan remains appropriate    Co-evaluation              AM-PAC PT "6 Clicks" Mobility   Outcome Measure  Help needed turning from your back to your side while in a flat bed without using bedrails?: A Little Help needed moving from lying on your back to sitting on the side of a flat bed without using bedrails?: A Little Help needed moving to and from a bed to a chair (including  a wheelchair)?: A Little Help needed standing up from a chair using your arms (e.g., wheelchair or bedside chair)?: A Little Help needed to walk in hospital room?: A Little Help needed climbing 3-5 steps with a railing? : A Little 6 Click Score: 18    End of Session Equipment Utilized During Treatment: Gait belt Activity Tolerance: Patient tolerated treatment well Patient left: in chair;with call bell/phone within reach;with chair alarm set Nurse Communication: Mobility status PT Visit Diagnosis: Other abnormalities of gait and mobility (R26.89)     Time: 1043-1101 PT Time Calculation (min) (ACUTE ONLY): 18 min  Charges:  $Gait Training: 8-22 mins                    Carmelia Bake, PT, DPT [place] Office: [phone] Pager: [place] 06/21/2018, 4:15 PM

## 2018-06-21 NOTE — Care Management Important Message (Signed)
Important Message  Patient Details IM Letter given to Sharren Bridge SW to present to the Patient Name: Victoria Patrick MRN: [id number] Date of Birth: 12-29-1922   Medicare Important Message Given:  Yes    Kerin Salen 06/21/2018, 10:49 AM

## 2018-06-21 NOTE — Progress Notes (Signed)
Occupational Therapy Treatment Patient Details Name: Victoria Patrick MRN: [id number] DOB: 07-14-1922 Today's Date: 06/21/2018    History of present illness 85 y.o. female with medical history significant of mild dementia, B12 deficiency, bladder cancer s/p urostomy, gastrointestinal stromal tumor, hypothyroidism, HTN and admitted for syncope episode.   OT comments  Pt very pleasant and able to speak with her niece on facetime during OT sesion  Follow Up Recommendations  SNF    Equipment Recommendations  None recommended by OT    Recommendations for Other Services      Precautions / Restrictions Precautions Precautions: Fall       Mobility Bed Mobility Overal bed mobility: Needs Assistance Bed Mobility: Supine to Sit;Sit to Supine     Supine to sit: Min assist Sit to supine: Min assist      Transfers Overall transfer level: Needs assistance Equipment used: Rolling walker (2 wheeled) Transfers: Sit to/from [facility] Sit to Stand: Min guard Stand pivot transfers: Min guard       General transfer comment: Vc for sequencing    Balance Overall balance assessment: Mild deficits observed, not formally tested(pt does not remember falls (?))                                         ADL either performed or assessed with clinical judgement   ADL Overall ADL's : Needs assistance/impaired     Grooming: Wash/dry face;Standing                   Toilet Transfer: Minimal assistance;RW;Cueing for sequencing;Cueing for safety   Toileting- Clothing Manipulation and Hygiene: Minimal assistance;Sit to/from stand;Cueing for safety;Cueing for sequencing         General ADL Comments: pt able to speak with with her niece this day.  Plan is ST SNF.  Pt is agreeable to ST SNF     Vision Patient Visual Report: No change from baseline            Cognition Arousal/Alertness: Awake/alert Behavior During Therapy: WFL for tasks  assessed/performed Overall Cognitive Status: History of cognitive impairments - at baseline                                                     Pertinent Vitals/ Pain       Pain Assessment: No/denies pain         Frequency  Min 2X/week        Progress Toward Goals  OT Goals(current goals can now be found in the care plan section)  Progress towards OT goals: Progressing toward goals     Plan Discharge plan remains appropriate       AM-PAC OT "6 Clicks" Daily Activity     Outcome Measure   Help from another person eating meals?: None Help from another person taking care of personal grooming?: A Little Help from another person toileting, which includes using toliet, bedpan, or urinal?: A Little Help from another person bathing (including washing, rinsing, drying)?: A Little Help from another person to put on and taking off regular upper body clothing?: A Little Help from another person to put on and taking off regular lower body clothing?: A Little 6 Click Score: 19  End of Session Equipment Utilized During Treatment: Rolling walker;Gait belt  OT Visit Diagnosis: Unsteadiness on feet (R26.81);Muscle weakness (generalized) (M62.81);Repeated falls (R29.6);History of falling (Z91.81)   Activity Tolerance Patient tolerated treatment well   Patient Left with call bell/phone within reach;in bed;with bed alarm set   Nurse Communication Mobility status        Time: 5051-8335 OT Time Calculation (min): 28 min  Charges: OT General Charges $OT Visit: 1 Visit OT Treatments $Self Care/Home Management : 23-37 mins  Kari Baars, [place] [phone] Office- [phone]      Harl Wiechmann, Edwena Felty D 06/21/2018, 12:08 PM

## 2018-06-21 NOTE — Progress Notes (Signed)
Provided SNF bed offers to daughter- she is reviewing to make choice. Daughter mentions she is also working to set up private aide home care once pt returns home from rehab.  Sharren Bridge, MSW, LCSW Transitions of Care 06/21/2018 [phone]

## 2018-06-22 DIAGNOSIS — R2689 Other abnormalities of gait and mobility: Secondary | ICD-10-CM | POA: Diagnosis not present

## 2018-06-22 DIAGNOSIS — E038 Other specified hypothyroidism: Secondary | ICD-10-CM | POA: Diagnosis not present

## 2018-06-22 DIAGNOSIS — K5900 Constipation, unspecified: Secondary | ICD-10-CM

## 2018-06-22 DIAGNOSIS — R279 Unspecified lack of coordination: Secondary | ICD-10-CM | POA: Diagnosis not present

## 2018-06-22 DIAGNOSIS — K3189 Other diseases of stomach and duodenum: Secondary | ICD-10-CM | POA: Diagnosis not present

## 2018-06-22 DIAGNOSIS — Z20828 Contact with and (suspected) exposure to other viral communicable diseases: Secondary | ICD-10-CM | POA: Diagnosis not present

## 2018-06-22 DIAGNOSIS — F419 Anxiety disorder, unspecified: Secondary | ICD-10-CM | POA: Diagnosis not present

## 2018-06-22 DIAGNOSIS — R413 Other amnesia: Secondary | ICD-10-CM | POA: Diagnosis not present

## 2018-06-22 DIAGNOSIS — Z743 Need for continuous supervision: Secondary | ICD-10-CM | POA: Diagnosis not present

## 2018-06-22 DIAGNOSIS — I10 Essential (primary) hypertension: Secondary | ICD-10-CM | POA: Diagnosis not present

## 2018-06-22 DIAGNOSIS — E039 Hypothyroidism, unspecified: Secondary | ICD-10-CM | POA: Diagnosis not present

## 2018-06-22 DIAGNOSIS — N179 Acute kidney failure, unspecified: Secondary | ICD-10-CM | POA: Diagnosis not present

## 2018-06-22 DIAGNOSIS — C679 Malignant neoplasm of bladder, unspecified: Secondary | ICD-10-CM | POA: Diagnosis not present

## 2018-06-22 DIAGNOSIS — B964 Proteus (mirabilis) (morganii) as the cause of diseases classified elsewhere: Secondary | ICD-10-CM | POA: Diagnosis not present

## 2018-06-22 DIAGNOSIS — F0391 Unspecified dementia with behavioral disturbance: Secondary | ICD-10-CM | POA: Diagnosis not present

## 2018-06-22 DIAGNOSIS — F0390 Unspecified dementia without behavioral disturbance: Secondary | ICD-10-CM | POA: Diagnosis not present

## 2018-06-22 DIAGNOSIS — R55 Syncope and collapse: Secondary | ICD-10-CM | POA: Diagnosis not present

## 2018-06-22 DIAGNOSIS — R410 Disorientation, unspecified: Secondary | ICD-10-CM | POA: Diagnosis not present

## 2018-06-22 DIAGNOSIS — E538 Deficiency of other specified B group vitamins: Secondary | ICD-10-CM | POA: Diagnosis not present

## 2018-06-22 DIAGNOSIS — R278 Other lack of coordination: Secondary | ICD-10-CM | POA: Diagnosis not present

## 2018-06-22 DIAGNOSIS — N390 Urinary tract infection, site not specified: Secondary | ICD-10-CM | POA: Diagnosis not present

## 2018-06-22 DIAGNOSIS — I951 Orthostatic hypotension: Secondary | ICD-10-CM | POA: Diagnosis not present

## 2018-06-22 DIAGNOSIS — R109 Unspecified abdominal pain: Secondary | ICD-10-CM | POA: Diagnosis not present

## 2018-06-22 DIAGNOSIS — R4020 Unspecified coma: Secondary | ICD-10-CM | POA: Diagnosis not present

## 2018-06-22 DIAGNOSIS — R41841 Cognitive communication deficit: Secondary | ICD-10-CM | POA: Diagnosis not present

## 2018-06-22 LAB — CREATININE, SERUM
Creatinine, Ser: 0.9 mg/dL (ref 0.44–1.00)
GFR calc Af Amer: >60 mL/min (ref >60)
GFR calc non Af Amer: 55 mL/min — ABNORMAL LOW (ref >60)

## 2018-06-22 LAB — NOVEL CORONAVIRUS, NAA (HOSP ORDER, SEND-OUT TO REF LAB; TAT 18-24 HRS): SARS-CoV-2, NAA: NOT DETECTED

## 2018-06-22 MED ORDER — SENNA 8.6 MG PO TABS: 1.0000 | ORAL_TABLET | Freq: Two times a day (BID) | ORAL | 0 refills | Status: DC

## 2018-06-22 MED ORDER — LOSARTAN POTASSIUM 100 MG PO TABS: 50.0000 mg | ORAL_TABLET | Freq: Every day | ORAL | Status: DC

## 2018-06-22 MED ORDER — VITAMIN D 25 MCG (1000 UNIT) PO TABS: 1000.0000 [IU] | ORAL_TABLET | Freq: Every day | ORAL | 0 refills | Status: AC

## 2018-06-22 MED ORDER — POLYETHYLENE GLYCOL 3350 17 G PO PACK: 17.0000 g | PACK | Freq: Two times a day (BID) | ORAL | 0 refills | Status: AC

## 2018-06-22 MED ORDER — LOSARTAN POTASSIUM 100 MG PO TABS: 50.0000 mg | ORAL_TABLET | Freq: Every day | ORAL | 0 refills | Status: AC

## 2018-06-22 MED ORDER — LEVOTHYROXINE SODIUM 100 MCG PO TABS: 100.0000 ug | ORAL_TABLET | Freq: Every day | ORAL | 0 refills | Status: AC

## 2018-06-22 MED ORDER — SENNA 8.6 MG PO TABS: 1.0000 | ORAL_TABLET | Freq: Two times a day (BID) | ORAL | 0 refills | Status: AC

## 2018-06-22 MED ORDER — ADULT MULTIVITAMIN W/MINERALS CH: 1.0000 | ORAL_TABLET | Freq: Every day | ORAL | 0 refills | Status: AC

## 2018-06-22 MED ORDER — POLYETHYLENE GLYCOL 3350 17 G PO PACK: 17.0000 g | PACK | Freq: Two times a day (BID) | ORAL | 0 refills | Status: DC

## 2018-06-22 MED ORDER — HYDROCORTISONE ACETATE 25 MG RE SUPP: 25.0000 mg | Freq: Two times a day (BID) | RECTAL | 0 refills | Status: AC

## 2018-06-22 NOTE — TOC Transition Note (Addendum)
Transition of Care [facility]) - CM/SW Discharge Note   Patient Details  Name: Victoria Patrick MRN: [id number] Date of Birth: Aug 16, 1922  Transition of Care [facility]) CM/SW Contact:  Lia Hopping, [place] Phone Number: 06/22/2018, 1:14 PM   Clinical Narrative:    CSW notified patient POA/ niece [name] about discharge to [facility] Patient to transport by [facility] Nurse call report to: [phone] Room 3209    Final next level of care: [place] Barriers to Discharge: No Barriers Identified   Patient Goals and CMS Choice        Discharge Placement PASRR number recieved: 06/22/18            Patient chooses bed at: [facility] Patient to be transferred to facility by: [facility] Name of family member notified: [name] Patient and family notified of of transfer: 06/22/18  Discharge Plan and Services                                     Social Determinants of Health (SDOH) Interventions     Readmission Risk Interventions No flowsheet data found.

## 2018-06-22 NOTE — Progress Notes (Signed)
Pt discharged to [facility], Pt called [facility] and spoke with [name] concerning transport. Left message with friend Kateryn Marasigan. Pt niece called and updated by SW. Pt prepared and ready for transport . SRP, RN

## 2018-06-22 NOTE — Discharge Summary (Signed)
Discharge Summary  Victoria Patrick HCW:[id number] DOB: 29-Oct-1922  PCP: Deland Pretty, MD  Admit date: 06/17/2018 Discharge date: 06/22/2018  Time spent: 35 mins  Recommendations for Outpatient Follow-up:  1. PCP  Discharge Diagnoses:  Active Hospital Problems   Diagnosis Date Noted   Leukocytosis 06/17/2018   Syncope 06/17/2018   AKI (acute kidney injury) ([place]) 06/17/2018   Dehydration 06/17/2018   Acute lower UTI 06/17/2018   Obstipation 06/17/2018   Memory disorder 08/30/2017   Weakness generalized 08/31/2014   Hypothyroidism 08/31/2014   Essential hypertension 08/31/2014   Bladder cancer ([place]) 04/30/2014    Resolved Hospital Problems  No resolved problems to display.    Discharge Condition: Stable  Diet recommendation: As tolerated  Vitals:   06/21/18 2012 06/22/18 0415  BP: 136/64 (!) 116/53  Pulse: 73 70  Resp: 19 18  Temp: 98.4 F (36.9 C) 98.8 F (37.1 C)  SpO2: 99% 97%    History of present illness:  85 year old female who presented after a syncope episode.  She does have significant past medical history of mild dementia, B12 deficiency, bladder cancer status post urostomy, gastrointestinal stromal tumor, hypothyroidism and hypertension.  Patient suffered a syncope while waiting in line at a [employer].  Patient had a sudden onset of loss of consciousness while standing.  No prodromal symptoms.  On her initial physical examination blood pressure 129/62, heart rate 82, respiratory rate 17, temperature 98, oxygen saturation 98%.  She had dry mucous membranes, her lungs were clear to auscultation bilaterally, heart S1-S2 present rhythm, abdomen soft, no lower extremity edema.  Sodium 138, potassium 4.7, chloride 101, bicarb 26, glucose 127, BUN 45, creatinine 1.56, white count 13.6, hemoglobin 9.9, hematocrit 38.4, platelets clumped.  SARS COVID-19 was negative.  Urinalysis 21-50 white cells.  CT of the head and cervical spine with no acute changes.   Chest radiograph with no infiltrates.  EKG 82 bpm, left axis deviation, sinus rhythm, no ST segment changes or T wave normalities.  Poor R wave progression.    Today, pt denies any complaints. Stable to d/c to [facility]  Hospital Course:  Active Problems:   Bladder cancer (HCC)   Weakness generalized   Essential hypertension   Hypothyroidism   Memory disorder   Leukocytosis   Syncope   AKI (acute kidney injury) ([place])   Dehydration   Acute lower UTI   Obstipation  Syncope/ orthostatic syncope Positive orthostatic hypotension on admission ECHO with preserved LV systolic function, no valvular disease Carotid ultrasounds bilateral, no significant abnormality PT/OT  AKI Resolved  UTI Unclear urinary symptoms due to cognitive impairment Urine culture grew greater than 100,000 Proteus mirabilis  S/P ceftriaxone-->Keflex for a total of 5 days-->completed  HTN Stable  May re-start recently changed losartan to a lower dose from 100-->25mg  pending BP/orthostasis  Hypothyroid Continue levothyroxine.  Constipation Continue aggressive bowel regimen, topical steroids for hemorrhoids.   Cognitive impairment           Malnutrition Type:      Malnutrition Characteristics:      Nutrition Interventions:      Estimated body mass index is 19.61 kg/m as calculated from the following:   Height as of this encounter: 5\' 7"  (1.702 m).   Weight as of this encounter: 56.8 kg.    Procedures:  None   Consultations:  None  Discharge Exam: BP (!) 116/53 (BP Location: Right Arm)    Pulse 70    Temp 98.8 F (37.1 C) (Oral)    Resp  18    Ht 5\' 7"  (1.702 m)    Wt 56.8 kg    SpO2 97%    BMI 19.61 kg/m   General: NAD Cardiovascular: S1, S2 present Respiratory: CTAB  Discharge Instructions You were cared for by a hospitalist during your hospital stay. If you have any questions about your discharge medications or the care you received while you were in the hospital  after you are discharged, you can call the unit and asked to speak with the hospitalist on call if the hospitalist that took care of you is not available. Once you are discharged, your primary care physician will handle any further medical issues. Please note that NO REFILLS for any discharge medications will be authorized once you are discharged, as it is imperative that you return to your primary care physician (or establish a relationship with a primary care physician if you do not have one) for your aftercare needs so that they can reassess your need for medications and monitor your lab values.   Allergies as of 06/22/2018      Reactions   Diovan [valsartan] Itching   Codeine Nausea And Vomiting      Medication List    TAKE these medications   CALCIUM CITRATE + PO Take 630 mg by mouth daily.   cholecalciferol 25 MCG (1000 UT) tablet Commonly known as: VITAMIN D3 Take 1,000 Units by mouth daily.   hydrocortisone 25 MG suppository Commonly known as: ANUSOL-HC Place 1 suppository (25 mg total) rectally 2 (two) times daily.   levothyroxine 100 MCG tablet Commonly known as: SYNTHROID Take 100 mcg by mouth daily before breakfast.   losartan 100 MG tablet Commonly known as: COZAAR Take 0.5 tablets (50 mg total) by mouth daily. What changed: how much to take   multivitamin with minerals Tabs tablet Take 1 tablet by mouth daily.   polyethylene glycol 17 g packet Commonly known as: MIRALAX / GLYCOLAX Take 17 g by mouth 2 (two) times daily.   senna 8.6 MG Tabs tablet Commonly known as: SENOKOT Take 1 tablet (8.6 mg total) by mouth 2 (two) times daily.      Allergies  Allergen Reactions   Diovan [Valsartan] Itching   Codeine Nausea And Vomiting      The results of significant diagnostics from this hospitalization (including imaging, microbiology, ancillary and laboratory) are listed below for reference.    Significant Diagnostic Studies: Dg Chest 2 View  Result  Date: 06/17/2018 CLINICAL DATA:  Syncope. EXAM: CHEST - 2 VIEW COMPARISON:  Radiographs of August 09, 2016. FINDINGS: The heart size and mediastinal contours are within normal limits. Both lungs are clear. No pneumothorax or pleural effusion is noted. The visualized skeletal structures are unremarkable. IMPRESSION: No active cardiopulmonary disease. Aortic Atherosclerosis (ICD10-I70.0). Electronically Signed   By: Marijo Conception M.D.   On: 06/17/2018 16:25   Dg Pelvis 1-2 Views  Result Date: 06/17/2018 CLINICAL DATA:  Syncope.  Fall. EXAM: PELVIS - 1-2 VIEW COMPARISON:  None. FINDINGS: There is no evidence of pelvic fracture or diastasis. Moderate narrowing of the right hip joint is noted. No pelvic bone lesions are seen. IMPRESSION: Moderate osteoarthritis of the right hip. No acute abnormality seen in the pelvis. Electronically Signed   By: Marijo Conception M.D.   On: 06/17/2018 16:27   Dg Abd 1 View  Result Date: 06/18/2018 CLINICAL DATA:  Obstipation EXAM: ABDOMEN - 1 VIEW COMPARISON:  06/17/2018 FINDINGS: Stable bowel gas pattern with moderate stool in the  colon and down into the rectum and scattered air-filled loops of small bowel without distention. No obvious free air. The soft tissue shadows of the abdomen are grossly maintained. The bony structures are intact. IMPRESSION: Stable nonobstructive bowel gas pattern.  Probable mild ileus. Electronically Signed   By: Marijo Sanes M.D.   On: 06/18/2018 08:14   Dg Abd 1 View  Result Date: 06/17/2018 CLINICAL DATA:  Constipation EXAM: ABDOMEN - 1 VIEW COMPARISON:  06/17/2018 FINDINGS: There is a moderate amount of stool in the colon and rectum. This appears slightly improved from prior study. Surgical staple lines are noted in the patient's pelvis. The bowel gas pattern is nonspecific. There is some gaseous distension of the colon. Osteoarthritis is again noted of both hips. There are degenerative changes throughout the lumbar spine. IMPRESSION: Moderate  stool burden. Electronically Signed   By: Constance Holster M.D.   On: 06/17/2018 20:29   Ct Head Wo Contrast  Result Date: 06/17/2018 CLINICAL DATA:  Syncopal episode, fall, confusion EXAM: CT HEAD WITHOUT CONTRAST CT CERVICAL SPINE WITHOUT CONTRAST TECHNIQUE: Multidetector CT imaging of the head and cervical spine was performed following the standard protocol without intravenous contrast. Multiplanar CT image reconstructions of the cervical spine were also generated. COMPARISON:  Prior CT scan of the head 08/31/2014 FINDINGS: CT HEAD FINDINGS Brain: No evidence of acute infarction, hemorrhage, hydrocephalus, extra-axial collection or mass lesion/mass effect. Cerebral and cerebellar cortical atrophy. Moderate periventricular white matter hypoattenuation most consistent with chronic microvascular ischemic white matter disease. Vascular: No hyperdense vessel or unexpected calcification. Skull: Normal. Negative for fracture or focal lesion. Sinuses/Orbits: No acute finding. Other: None. CT CERVICAL SPINE FINDINGS Alignment: Straightening of the normal cervical lordosis. Skull base and vertebrae: No evidence of acute fracture or malalignment Soft tissues and spinal canal: No prevertebral fluid or swelling. No visible canal hematoma. Disc levels: Cervical spondylosis most notable at C5-C6 and C6-C7. Multilevel anterolisthesis most significant at 2-3 mm of C2 on C3 and 3 mm of C3 on C4. Left-sided facet arthropathy and ankylosis of the posterior elements at C2-C3. Upper chest: Negative. Other: None IMPRESSION: CT HEAD 1. No acute intracranial abnormality. 2. Atrophy and chronic microvascular ischemic white matter disease. CT CSPINE 1. No acute fracture or malalignment. 2. Multilevel cervical spondylosis. Electronically Signed   By: Jacqulynn Cadet M.D.   On: 06/17/2018 16:38   Ct Cervical Spine Wo Contrast  Result Date: 06/17/2018 CLINICAL DATA:  Syncopal episode, fall, confusion EXAM: CT HEAD WITHOUT CONTRAST  CT CERVICAL SPINE WITHOUT CONTRAST TECHNIQUE: Multidetector CT imaging of the head and cervical spine was performed following the standard protocol without intravenous contrast. Multiplanar CT image reconstructions of the cervical spine were also generated. COMPARISON:  Prior CT scan of the head 08/31/2014 FINDINGS: CT HEAD FINDINGS Brain: No evidence of acute infarction, hemorrhage, hydrocephalus, extra-axial collection or mass lesion/mass effect. Cerebral and cerebellar cortical atrophy. Moderate periventricular white matter hypoattenuation most consistent with chronic microvascular ischemic white matter disease. Vascular: No hyperdense vessel or unexpected calcification. Skull: Normal. Negative for fracture or focal lesion. Sinuses/Orbits: No acute finding. Other: None. CT CERVICAL SPINE FINDINGS Alignment: Straightening of the normal cervical lordosis. Skull base and vertebrae: No evidence of acute fracture or malalignment Soft tissues and spinal canal: No prevertebral fluid or swelling. No visible canal hematoma. Disc levels: Cervical spondylosis most notable at C5-C6 and C6-C7. Multilevel anterolisthesis most significant at 2-3 mm of C2 on C3 and 3 mm of C3 on C4. Left-sided facet arthropathy and ankylosis of  the posterior elements at C2-C3. Upper chest: Negative. Other: None IMPRESSION: CT HEAD 1. No acute intracranial abnormality. 2. Atrophy and chronic microvascular ischemic white matter disease. CT CSPINE 1. No acute fracture or malalignment. 2. Multilevel cervical spondylosis. Electronically Signed   By: Jacqulynn Cadet M.D.   On: 06/17/2018 16:38   Vas US Carotid  Result Date: 06/18/2018 Carotid Arterial Duplex Study Indications:       Syncope. Risk Factors:      Hypertension. Comparison Study:  NO PRIOR STUDY Performing Technologist: Abram Sander RVS  Examination Guidelines: A complete evaluation includes B-mode imaging, spectral Doppler, color Doppler, and power Doppler as needed of all accessible  portions of each vessel. Bilateral testing is considered an integral part of a complete examination. Limited examinations for reoccurring indications may be performed as noted.  Right Carotid Findings: +----------+--------+--------+--------+-----------+--------+             PSV cm/s EDV cm/s Stenosis Describe    Comments  +----------+--------+--------+--------+-----------+--------+  CCA Prox   117      10                homogeneous           +----------+--------+--------+--------+-----------+--------+  CCA Distal 46                         homogeneous           +----------+--------+--------+--------+-----------+--------+  ICA Prox   71       14       1-39%    homogeneous           +----------+--------+--------+--------+-----------+--------+  ICA Distal 77       16                                      +----------+--------+--------+--------+-----------+--------+  ECA        87                                               +----------+--------+--------+--------+-----------+--------+ +----------+--------+-------+--------+-------------------+             PSV cm/s EDV cms Describe Arm Pressure (mmHG)  +----------+--------+-------+--------+-------------------+  Subclavian 102                                            +----------+--------+-------+--------+-------------------+ +---------+--------+--+--------+-+---------+  Vertebral PSV cm/s 51 EDV cm/s 8 Antegrade  +---------+--------+--+--------+-+---------+  Left Carotid Findings: +----------+--------+--------+--------+-----------+--------+             PSV cm/s EDV cm/s Stenosis Describe    Comments  +----------+--------+--------+--------+-----------+--------+  CCA Prox   83       10                homogeneous           +----------+--------+--------+--------+-----------+--------+  CCA Distal 57       13                homogeneous           +----------+--------+--------+--------+-----------+--------+  ICA Prox   95       18       1-39%  homogeneous            +----------+--------+--------+--------+-----------+--------+  ICA Distal 86       20                                      +----------+--------+--------+--------+-----------+--------+  ECA        82                                               +----------+--------+--------+--------+-----------+--------+ +----------+--------+--------+--------+-------------------+  Subclavian PSV cm/s EDV cm/s Describe Arm Pressure (mmHG)  +----------+--------+--------+--------+-------------------+             117                                             +----------+--------+--------+--------+-------------------+ +---------+--------+---+--------+--+---------+  Vertebral PSV cm/s 114 EDV cm/s 14 Antegrade  +---------+--------+---+--------+--+---------+  Summary: Right Carotid: Velocities in the right ICA are consistent with a 1-39% stenosis. Left Carotid: Velocities in the left ICA are consistent with a 1-39% stenosis. Vertebrals: Bilateral vertebral arteries demonstrate antegrade flow. *See table(s) above for measurements and observations.  Electronically signed by Antony Contras MD on 06/18/2018 at 1:18:07 PM.    Final     Microbiology: Recent Results (from the past 240 hour(s))  Urine culture     Status: Abnormal   Collection Time: 06/17/18  3:56 PM   Specimen: Urine, Random  Result Value Ref Range Status   Specimen Description   Final    URINE, RANDOM Performed at [facility], [place] [address], [place], [place] [address]    Special Requests   Final    NONE Performed at [facility], [place] [address] [place], [place] [address]    Culture >=100,000 COLONIES/mL PROTEUS MIRABILIS (A)  Final   Report Status 06/20/2018 FINAL  Final   Organism ID, Bacteria PROTEUS MIRABILIS (A)  Final      Susceptibility   Proteus mirabilis - MIC*    AMPICILLIN <=2 SENSITIVE Sensitive     CEFAZOLIN <=4 SENSITIVE Sensitive     CEFTRIAXONE <=1 SENSITIVE Sensitive     CIPROFLOXACIN <=0.25 SENSITIVE  Sensitive     GENTAMICIN <=1 SENSITIVE Sensitive     IMIPENEM 8 INTERMEDIATE Intermediate     NITROFURANTOIN 128 RESISTANT Resistant     TRIMETH/SULFA >=320 RESISTANT Resistant     AMPICILLIN/SULBACTAM <=2 SENSITIVE Sensitive     PIP/TAZO <=4 SENSITIVE Sensitive     * >=100,000 COLONIES/mL PROTEUS MIRABILIS  SARS Coronavirus 2 (CEPHEID - Performed in [facility] hospital lab), Hosp Order     Status: None   Collection Time: 06/17/18  6:56 PM   Specimen: Nasopharyngeal Swab  Result Value Ref Range Status   SARS Coronavirus 2 NEGATIVE NEGATIVE Final    Comment: (NOTE) If result is NEGATIVE SARS-CoV-2 target nucleic acids are NOT DETECTED. The SARS-CoV-2 RNA is generally detectable in upper and lower  respiratory specimens during the acute phase of infection. The lowest  concentration of SARS-CoV-2 viral copies this assay can detect is 250  copies / mL. A negative result does not preclude SARS-CoV-2 infection  and should not be used as the sole basis for treatment  or other  patient management decisions.  A negative result may occur with  improper specimen collection / handling, submission of specimen other  than nasopharyngeal swab, presence of viral mutation(s) within the  areas targeted by this assay, and inadequate number of viral copies  (<250 copies / mL). A negative result must be combined with clinical  observations, patient history, and epidemiological information. If result is POSITIVE SARS-CoV-2 target nucleic acids are DETECTED. The SARS-CoV-2 RNA is generally detectable in upper and lower  respiratory specimens dur ing the acute phase of infection.  Positive  results are indicative of active infection with SARS-CoV-2.  Clinical  correlation with patient history and other diagnostic information is  necessary to determine patient infection status.  Positive results do  not rule out bacterial infection or co-infection with other viruses. If result is PRESUMPTIVE  POSTIVE SARS-CoV-2 nucleic acids MAY BE PRESENT.   A presumptive positive result was obtained on the submitted specimen  and confirmed on repeat testing.  While 2019 novel coronavirus  (SARS-CoV-2) nucleic acids may be present in the submitted sample  additional confirmatory testing may be necessary for epidemiological  and / or clinical management purposes  to differentiate between  SARS-CoV-2 and other Sarbecovirus currently known to infect humans.  If clinically indicated additional testing with an alternate test  methodology [phone]) is advised. The SARS-CoV-2 RNA is generally  detectable in upper and lower respiratory sp ecimens during the acute  phase of infection. The expected result is Negative. Fact Sheet for Patients:  StrictlyIdeas.no Fact Sheet for Healthcare Providers: BankingDealers.co.za This test is not yet approved or cleared by the [place] FDA and has been authorized for detection and/or diagnosis of SARS-CoV-2 by FDA under an Emergency Use Authorization (EUA).  This EUA will remain in effect (meaning this test can be used) for the duration of the COVID-19 declaration under Section 564(b)(1) of the Act, 21 U.S.C. section 360bbb-3(b)(1), unless the authorization is terminated or revoked sooner. Performed at [facility], [place] [address], [place], [place] [address]   Novel Coronavirus, NAA (hospital order; send-out to ref lab)     Status: None   Collection Time: 06/21/18  3:49 PM   Specimen: Nasopharyngeal Swab; Respiratory  Result Value Ref Range Status   SARS-CoV-2, NAA NOT DETECTED NOT DETECTED Final    Comment: (NOTE) This test was developed and its performance characteristics determined by [employer]. This test has not been FDA cleared or approved. This test has been authorized by FDA under an Emergency Use Authorization (EUA). This test is only authorized for the duration of  time the declaration that circumstances exist justifying the authorization of the emergency use of in vitro diagnostic tests for detection of SARS-CoV-2 virus and/or diagnosis of COVID-19 infection under section 564(b)(1) of the Act, 21 U.S.C. 951OAC-1(Y)(6), unless the authorization is terminated or revoked sooner. When diagnostic testing is negative, the possibility of a false negative result should be considered in the context of a patient's recent exposures and the presence of clinical signs and symptoms consistent with COVID-19. An individual without symptoms of COVID-19 and who is not shedding SARS-CoV-2 virus would expect to have a negative (not detected) result in this assay. Performed  At: [facility] [place], [place] [id number] Rush Farmer MD XN:[id number]    [place]  Final    Comment: Performed at [place] [address] [place], [place] [address]     Labs: Basic Metabolic Panel: Recent  Labs  Lab 06/17/18 1634 06/17/18 2153 06/18/18 0310 06/19/18 0602 06/20/18 0604 06/22/18 0427  NA 138  --  135 135 136  --   K 4.7  --  4.3 3.9 3.7  --   CL 101  --  103 106 104  --   CO2 26  --  24 23 22   --   GLUCOSE 127*  --  116* 134* 112*  --   BUN 45*  --  40* 28* 18  --   CREATININE 1.56*  --  1.18* 1.06* 0.99 0.90  CALCIUM 10.3  --  8.9 8.4* 8.0*  --   MG  --  2.3 2.1  --   --   --   PHOS  --   --  3.3  --   --   --    Liver Function Tests: Recent Labs  Lab 06/17/18 1634 06/18/18 0310  AST 16 14*  ALT 11 9  ALKPHOS 81 63  BILITOT 0.9 0.3  PROT 8.0 6.3*  ALBUMIN 3.9 2.8*   No results for input(s): LIPASE, AMYLASE in the last 168 hours. No results for input(s): AMMONIA in the last 168 hours. CBC: Recent Labs  Lab 06/17/18 1634 06/18/18 0310 06/19/18 0602  WBC 13.6* 8.7 7.8  NEUTROABS 11.9*  --  5.8  HGB 11.9* 9.7* 9.6*  HCT 38.4 31.6* 30.8*  MCV 91.0 91.1 91.4  PLT PLATELET  CLUMPS NOTED ON SMEAR, UNABLE TO ESTIMATE PLATELET CLUMPS NOTED ON SMEAR, UNABLE TO ESTIMATE PLATELET CLUMPS NOTED ON SMEAR, UNABLE TO ESTIMATE   Cardiac Enzymes: Recent Labs  Lab 06/17/18 1634 06/17/18 2153 06/18/18 0310 06/18/18 0904  TROPONINI <0.03 <0.03 <0.03 <0.03   BNP: BNP (last 3 results) No results for input(s): BNP in the last 8760 hours.  ProBNP (last 3 results) No results for input(s): PROBNP in the last 8760 hours.  CBG: No results for input(s): GLUCAP in the last 168 hours.     Signed:  Alma Friendly, MD [facility] 06/22/2018, 12:48 PM

## 2018-06-22 NOTE — Progress Notes (Signed)
Occupational Therapy Treatment Patient Details Name: Victoria Patrick MRN: [id number] DOB: 02-23-1922 Today's Date: 06/22/2018    History of present illness 85 y.o. female with medical history significant of mild dementia, B12 deficiency, bladder cancer s/p urostomy, gastrointestinal stromal tumor, hypothyroidism, HTN and admitted for syncope episode.   OT comments  Pt plans to go to SNF  Follow Up Recommendations  SNF    Equipment Recommendations  None recommended by OT    Recommendations for Other Services      Precautions / Restrictions Precautions Precautions: Fall Precaution Comments: urostomy       Mobility Bed Mobility Overal bed mobility: Needs Assistance Bed Mobility: Supine to Sit     Supine to sit: Min assist     General bed mobility comments: assist for trunk  Transfers Overall transfer level: Needs assistance Equipment used: Rolling walker (2 wheeled) Transfers: Sit to/from [facility] Sit to Stand: Min guard Stand pivot transfers: Min guard       General transfer comment: verbal cues for hand placement    Balance Overall balance assessment: Mild deficits observed, not formally tested(pt does not remember falls (?))                                         ADL either performed or assessed with clinical judgement   ADL Overall ADL's : Needs assistance/impaired     Grooming: Wash/dry face;Standing;Oral care   Upper Body Bathing: Set up;Sitting       Upper Body Dressing : Set up;Sitting   Lower Body Dressing: Sit to/from stand;Cueing for sequencing;Cueing for safety;Moderate assistance   Toilet Transfer: Minimal assistance;RW;Cueing for sequencing;Cueing for safety   Toileting- Clothing Manipulation and Hygiene: Minimal assistance;Sit to/from stand;Cueing for safety;Cueing for sequencing         General ADL Comments: Pt plans on rehab at SNF. Pt donned clothes this day.  Pt pleasantly confused with good  participation.  Able to facetime niece, [name].  Pt confused about why neice cant cant see her ( COVID).  Educated niece pt will need clothes at SNF     Vision Patient Visual Report: No change from baseline            Cognition Arousal/Alertness: Awake/alert Behavior During Therapy: WFL for tasks assessed/performed Overall Cognitive Status: History of cognitive impairments - at baseline                                 General Comments: pt presents with decreased short term memory, asked the same questions multiple times                   Pertinent Vitals/ Pain       Pain Assessment: No/denies pain         Frequency  Min 2X/week        Progress Toward Goals  OT Goals(current goals can now be found in the care plan section)  Progress towards OT goals: Progressing toward goals     Plan Discharge plan remains appropriate       AM-PAC OT "6 Clicks" Daily Activity     Outcome Measure   Help from another person eating meals?: None Help from another person taking care of personal grooming?: A Little Help from another person toileting, which includes using toliet, bedpan, or urinal?: A Little  Help from another person bathing (including washing, rinsing, drying)?: A Little Help from another person to put on and taking off regular upper body clothing?: A Little Help from another person to put on and taking off regular lower body clothing?: A Little 6 Click Score: 19    End of Session Equipment Utilized During Treatment: Rolling walker;Gait belt  OT Visit Diagnosis: Unsteadiness on feet (R26.81);Muscle weakness (generalized) (M62.81);Repeated falls (R29.6);History of falling (Z91.81)   Activity Tolerance Patient tolerated treatment well   Patient Left with call bell/phone within reach;in chair;with chair alarm set;with nursing/sitter in room   Nurse Communication Mobility status        Time: 8115-7262 OT Time Calculation (min): 17 min  Charges:  OT General Charges $OT Visit: 1 Visit OT Treatments $Self Care/Home Management : 8-22 mins  Kari Baars, [place] [phone] Office- [place], Edwena Felty D 06/22/2018, 3:36 PM

## 2018-06-22 NOTE — Plan of Care (Signed)
  Problem: Acute Rehab PT Goals(only PT should resolve) Goal: Pt Will Go Supine/Side To Sit Outcome: Adequate for Discharge Goal: Patient Will Transfer Sit To/From Stand Outcome: Adequate for Discharge Goal: Pt Will Ambulate Outcome: Adequate for Discharge   Problem: Acute Rehab OT Goals (only OT should resolve) Goal: Pt. Will Perform Grooming Outcome: Adequate for Discharge Goal: Pt. Will Perform Lower Body Dressing Outcome: Adequate for Discharge Goal: Pt. Will Transfer To Toilet Outcome: Adequate for Discharge Goal: Pt. Will Perform Toileting-Clothing Manipulation Outcome: Adequate for Discharge   Problem: Education: Goal: Knowledge of General Education information will improve Description: Including pain rating scale, medication(s)/side effects and non-pharmacologic comfort measures Outcome: Adequate for Discharge   Problem: Health Behavior/Discharge Planning: Goal: Ability to manage health-related needs will improve Outcome: Adequate for Discharge   Problem: Clinical Measurements: Goal: Ability to maintain clinical measurements within normal limits will improve Outcome: Adequate for Discharge Goal: Will remain free from infection Outcome: Adequate for Discharge Goal: Diagnostic test results will improve Outcome: Adequate for Discharge Goal: Respiratory complications will improve Outcome: Adequate for Discharge Goal: Cardiovascular complication will be avoided Outcome: Adequate for Discharge   Problem: Activity: Goal: Risk for activity intolerance will decrease Outcome: Adequate for Discharge   Problem: Nutrition: Goal: Adequate nutrition will be maintained Outcome: Adequate for Discharge   Problem: Coping: Goal: Level of anxiety will decrease Outcome: Adequate for Discharge   Problem: Elimination: Goal: Will not experience complications related to bowel motility Outcome: Adequate for Discharge Goal: Will not experience complications related to urinary  retention Outcome: Adequate for Discharge   Problem: Pain Managment: Goal: General experience of comfort will improve Outcome: Adequate for Discharge   Problem: Safety: Goal: Ability to remain free from injury will improve Outcome: Adequate for Discharge   Problem: Skin Integrity: Goal: Risk for impaired skin integrity will decrease Outcome: Adequate for Discharge

## 2018-06-22 NOTE — Plan of Care (Signed)
  Problem: Acute Rehab PT Goals(only PT should resolve) Goal: Pt Will Go Supine/Side To Sit Outcome: Adequate for Discharge Goal: Patient Will Transfer Sit To/From Stand Outcome: Adequate for Discharge Goal: Pt Will Ambulate Outcome: Adequate for Discharge   Problem: Acute Rehab OT Goals (only OT should resolve) Goal: Pt. Will Perform Grooming Outcome: Adequate for Discharge Goal: Pt. Will Perform Lower Body Dressing Outcome: Adequate for Discharge Goal: Pt. Will Transfer To Toilet Outcome: Adequate for Discharge Goal: Pt. Will Perform Toileting-Clothing Manipulation Outcome: Adequate for Discharge

## 2018-06-22 NOTE — Plan of Care (Signed)
Pt discharge to nursing facility Plan of care complete.

## 2018-06-22 NOTE — Progress Notes (Signed)
Report called to [facility], LPN at [facility]. Pt dressed and ready for EMS to transport. SRP, RN

## 2018-06-22 NOTE — Progress Notes (Signed)
Pt will discharge to Nursing facility. Pt alert and disoriented. SRP, RN

## 2018-06-24 DIAGNOSIS — E038 Other specified hypothyroidism: Secondary | ICD-10-CM | POA: Diagnosis not present

## 2018-06-24 DIAGNOSIS — C679 Malignant neoplasm of bladder, unspecified: Secondary | ICD-10-CM | POA: Diagnosis not present

## 2018-06-24 DIAGNOSIS — R55 Syncope and collapse: Secondary | ICD-10-CM | POA: Diagnosis not present

## 2018-06-24 DIAGNOSIS — I951 Orthostatic hypotension: Secondary | ICD-10-CM | POA: Diagnosis not present

## 2018-06-28 ENCOUNTER — Other Ambulatory Visit: Payer: Self-pay | Admitting: *Deleted

## 2018-06-28 NOTE — Patient Outreach (Signed)
Member assessed for potential [facility] Care Management needs as a benefit of her [place] Medicare.  Member remains at [facility] SNF for rehab therapy. She previously lived at home alone.   Will plan to outreach to discharge planner to confirm disposition plans.  If member disposition plan is for home. Writer to outreach to discuss [employer] Management program services.  [name], MSN-Ed, RN,BSN [place] Acute Care Coordinator [phone] [facility]) [phone]  (Toll free office)

## 2018-07-02 DIAGNOSIS — C679 Malignant neoplasm of bladder, unspecified: Secondary | ICD-10-CM | POA: Diagnosis not present

## 2018-07-02 DIAGNOSIS — I951 Orthostatic hypotension: Secondary | ICD-10-CM | POA: Diagnosis not present

## 2018-07-02 DIAGNOSIS — R55 Syncope and collapse: Secondary | ICD-10-CM | POA: Diagnosis not present

## 2018-07-02 DIAGNOSIS — E038 Other specified hypothyroidism: Secondary | ICD-10-CM | POA: Diagnosis not present

## 2018-07-03 DIAGNOSIS — F0390 Unspecified dementia without behavioral disturbance: Secondary | ICD-10-CM | POA: Diagnosis not present

## 2018-07-03 DIAGNOSIS — C679 Malignant neoplasm of bladder, unspecified: Secondary | ICD-10-CM | POA: Diagnosis not present

## 2018-07-03 DIAGNOSIS — R55 Syncope and collapse: Secondary | ICD-10-CM | POA: Diagnosis not present

## 2018-07-03 DIAGNOSIS — R109 Unspecified abdominal pain: Secondary | ICD-10-CM | POA: Diagnosis not present

## 2018-07-05 ENCOUNTER — Other Ambulatory Visit: Payer: Self-pay | Admitting: *Deleted

## 2018-07-05 NOTE — Patient Outreach (Signed)
Member assessed for potential [facility] Care Management needs as a benefit of [place] Medicare.  Member is currently at [facility] SNF receiving rehab therapy.   Discharge planner indicated that writer should contact Mrs. Starkes's POA- Harland Dingwall.  Writer contacted Harland Dingwall at [phone] to discuss [place] Management services. Patient identifiers confirmed.   Victoria Patrick reports she is actually Mrs. Jasek's sister-in-law. States Mrs. Thaxton does not have any other family and that she decided long ago to be her responsible party. Victoria Patrick lives in [place] herself. She states that she has considered ALF for Mrs. Rother but rather try home first with 24hr caregivers. States member will utilize [employer]' agency for caregiver assistance.  Victoria Patrick states it will be very helpful to have someone to speak with post SNF discharge. States she will manage as much as she can from [facility]. Victoria Patrick is appreciative of any additional support and guidance she can receive from [place] Management.   Victoria Patrick confirms that [facility] will prepare member's meals. She also declines [facility] social worker assistance at this time for long term care planning. States she wants to see how 24/7 caregivers work out first. States "I want her to stay in her home as long as she can.". Mrs. Tindal states she may eventually move Mrs. Bumgardner in an [place] in [place] to be closer to her.  Mrs. Lubin is forgetful and has dementia. She also has a history of bladder cancer, HTN.   Victoria Patrick is agreeable to [place] follow up for complex case management needs. Victoria Patrick understands that she could request [facility] social work or pharmacy assistance if needed. Provided [facility] Care Management contact information as well as [facility] website information.  Victoria Patrick was extremely appreciative of the call. Confirms the best way to reach her is cell at [phone].  Sent notification to [facility]  discharge planner and [facility] UM RN to make aware [place] referral will be made closer to SNF discharge.   [name], MSN-Ed, RN,BSN [place] Acute Care Coordinator [phone] [facility]) [phone]  (Toll free office)

## 2018-07-09 DIAGNOSIS — E039 Hypothyroidism, unspecified: Secondary | ICD-10-CM | POA: Diagnosis not present

## 2018-07-09 DIAGNOSIS — C679 Malignant neoplasm of bladder, unspecified: Secondary | ICD-10-CM | POA: Diagnosis not present

## 2018-07-09 DIAGNOSIS — F0391 Unspecified dementia with behavioral disturbance: Secondary | ICD-10-CM | POA: Diagnosis not present

## 2018-07-09 DIAGNOSIS — I951 Orthostatic hypotension: Secondary | ICD-10-CM | POA: Diagnosis not present

## 2018-07-09 DIAGNOSIS — E538 Deficiency of other specified B group vitamins: Secondary | ICD-10-CM | POA: Diagnosis not present

## 2018-07-09 DIAGNOSIS — E038 Other specified hypothyroidism: Secondary | ICD-10-CM | POA: Diagnosis not present

## 2018-07-09 DIAGNOSIS — R55 Syncope and collapse: Secondary | ICD-10-CM | POA: Diagnosis not present

## 2018-07-09 DIAGNOSIS — F0390 Unspecified dementia without behavioral disturbance: Secondary | ICD-10-CM | POA: Diagnosis not present

## 2018-07-15 DIAGNOSIS — I10 Essential (primary) hypertension: Secondary | ICD-10-CM | POA: Diagnosis not present

## 2018-07-15 DIAGNOSIS — F0390 Unspecified dementia without behavioral disturbance: Secondary | ICD-10-CM | POA: Diagnosis not present

## 2018-07-15 DIAGNOSIS — E038 Other specified hypothyroidism: Secondary | ICD-10-CM | POA: Diagnosis not present

## 2018-07-15 DIAGNOSIS — R55 Syncope and collapse: Secondary | ICD-10-CM | POA: Diagnosis not present

## 2018-07-16 ENCOUNTER — Other Ambulatory Visit: Payer: Self-pay | Admitting: *Deleted

## 2018-07-16 DIAGNOSIS — I10 Essential (primary) hypertension: Secondary | ICD-10-CM

## 2018-07-16 DIAGNOSIS — N390 Urinary tract infection, site not specified: Secondary | ICD-10-CM | POA: Diagnosis not present

## 2018-07-16 NOTE — Patient Outreach (Signed)
Received telephone call from member's sister in law, Victoria Patrick. Victoria Patrick reports that Victoria Patrick discharged from [facility] SNF today.  Victoria Patrick states Victoria Patrick cannot walk and that it took 3 people to get her out of the car into the bed. Victoria Patrick endorses that [employer] aide is there as well.  Victoria Patrick reports member does not have any equipment and that she needs it. She is unsure of the home health agency as well. Victoria Patrick states she is calling to see if Probation officer can help in any way.   Victoria Patrick states Victoria Patrick is weak and lethargic.States she is arousable and will talk to them. Denies having any distress other complaint of back ache.   Victoria Patrick advised that Probation officer will contact [facility] discharge planner regarding Victoria Patrick's concerns.   Writer was able to speak with [name], [place] [facility] discharge planner. [name] reports that she made Victoria Patrick (sister in law) aware that Victoria Patrick was "not really ambulating." [name] also states that she was previously told by sister in law that member already had equipment. Therefore none was ordered. Writer requested that [name] please order Victoria Patrick a transport chair, bedside commode, and walker. [name] states that she will order requested equipment and will follow up with the sister in law as well. Writer was informed that [facility] was set up for home health.   Telephone call made back to Victoria Patrick (sister in law) at [phone] to make aware of details [name] ([facility] discharge planner) provided. Victoria Patrick then states she is concerned that Victoria Patrick may have a UTI. Writer asked that Victoria Patrick please contact her Primary Care Provider regarding this.   Writer alerted [facility] team, including [facility] UM Medical Director of all of the above notes.   Planning to follow up on recommendations as well as make [facility] Management referral.    [name], MSN-Ed, RN,BSN [place] Acute Care Coordinator [phone] [facility]) [phone]  (Toll free office)

## 2018-07-16 NOTE — Patient Outreach (Signed)
Telephone call made again to [name] at [phone]. [name] states she has made 2 calls to the PCP office to see if member can have an antibiotic called in. In the meantime, [name] states they are taking Mrs. Mersch's blood pressure. Writer also contacted PCP Dr. Pennie Banter office on [employer].   [name] was a bit distracted during conversation due to her concern for member. States Mrs. Gosser is resting in bed. [name] states that she does not want member to return to the ED or the hospital. [name] also states that she has all member's medications. States the goal is for Mrs. Fiorentino to stay in her home as long as she can. States she plans on having 24/7 caregiver assistance for a while but not "forever." States, " I just want to see how she does after she gets an antibiotic."  [name] agrees that Mrs. Tugman may need  hospice at some point but doesn't want to discuss that at this time. States " I will have to be the one to make that decision."   [name] was very appreciative of all of the follow up. She expressed gratitude for NP visit for tomorrow that [facility] UM Director has facilitated.   In the meantime, writer made [place] referral as well for follow up. Will update [facility] RNCM of all of the developments today once assignment is known.  [name], MSN-Ed, RN,BSN [place] Acute Care Coordinator [phone] [facility]) [phone]  (Toll free office)

## 2018-07-17 ENCOUNTER — Other Ambulatory Visit: Payer: Medicare Other | Admitting: Licensed Clinical Social Worker

## 2018-07-17 ENCOUNTER — Other Ambulatory Visit: Payer: Self-pay | Admitting: *Deleted

## 2018-07-17 DIAGNOSIS — Z515 Encounter for palliative care: Secondary | ICD-10-CM

## 2018-07-17 DIAGNOSIS — Z87440 Personal history of urinary (tract) infections: Secondary | ICD-10-CM | POA: Diagnosis not present

## 2018-07-17 DIAGNOSIS — F419 Anxiety disorder, unspecified: Secondary | ICD-10-CM | POA: Diagnosis not present

## 2018-07-17 DIAGNOSIS — N390 Urinary tract infection, site not specified: Secondary | ICD-10-CM | POA: Diagnosis not present

## 2018-07-17 DIAGNOSIS — C679 Malignant neoplasm of bladder, unspecified: Secondary | ICD-10-CM | POA: Diagnosis not present

## 2018-07-17 DIAGNOSIS — F0390 Unspecified dementia without behavioral disturbance: Secondary | ICD-10-CM | POA: Diagnosis not present

## 2018-07-17 DIAGNOSIS — Z936 Other artificial openings of urinary tract status: Secondary | ICD-10-CM | POA: Diagnosis not present

## 2018-07-17 DIAGNOSIS — I950 Idiopathic hypotension: Secondary | ICD-10-CM | POA: Diagnosis not present

## 2018-07-17 NOTE — Patient Outreach (Signed)
Telephone call made to member's sister in law,  Victoria Patrick  at [phone] to follow up on how Victoria Patrick did last night. Victoria Patrick reports that they did receive an order for an antibiotic. Victoria Patrick states Victoria Patrick urine looks very cloudy and they feel like she has an urinary tract infection.  Victoria Patrick states Victoria Patrick "rolled" out of her bed last night. She denies any injury. Victoria Patrick has 24/7 caregiver with her from [facility].   Victoria Patrick also reported that [facility] admission nurse is supposed to come out this morning as well to start home health services.   Discussed with Victoria Patrick again that Victoria Patrick could benefit from hospice or palliative care services. Victoria Patrick states she will discuss with Victoria Patrick.  Updated [place] of all of the events post SNF discharge.   Will continue to collaborate with team members and family as needed.    Victoria Rolling, MSN-Ed, RN,BSN [place] Acute Care Coordinator [phone] [facility]) [phone]  (Toll free office)

## 2018-07-18 ENCOUNTER — Other Ambulatory Visit: Payer: Medicare Other | Admitting: Licensed Clinical Social Worker

## 2018-07-18 ENCOUNTER — Other Ambulatory Visit: Payer: Self-pay | Admitting: *Deleted

## 2018-07-18 ENCOUNTER — Other Ambulatory Visit: Payer: Self-pay

## 2018-07-18 ENCOUNTER — Other Ambulatory Visit: Payer: Medicare Other | Admitting: *Deleted

## 2018-07-18 DIAGNOSIS — N390 Urinary tract infection, site not specified: Secondary | ICD-10-CM | POA: Diagnosis not present

## 2018-07-18 DIAGNOSIS — Z515 Encounter for palliative care: Secondary | ICD-10-CM

## 2018-07-18 DIAGNOSIS — C679 Malignant neoplasm of bladder, unspecified: Secondary | ICD-10-CM | POA: Diagnosis not present

## 2018-07-18 DIAGNOSIS — I950 Idiopathic hypotension: Secondary | ICD-10-CM | POA: Diagnosis not present

## 2018-07-18 DIAGNOSIS — F0390 Unspecified dementia without behavioral disturbance: Secondary | ICD-10-CM | POA: Diagnosis not present

## 2018-07-18 DIAGNOSIS — Z936 Other artificial openings of urinary tract status: Secondary | ICD-10-CM | POA: Diagnosis not present

## 2018-07-18 DIAGNOSIS — F419 Anxiety disorder, unspecified: Secondary | ICD-10-CM | POA: Diagnosis not present

## 2018-07-18 NOTE — Patient Outreach (Signed)
[facility]) Care Management  07/18/2018  KYLA DUFFY [birth year] [id number]   Referral received from post acute care coordinator as member was recently discharged from SNF.  She was hospitalized 6/8-6/13 for syncope, discharged from SNF on 7/7.  Per chart, she has history of bladder cancer with urostomy, hypertension, dementia, & hypothyroidism.  Noted that contact person is sister in law Victoria Patrick.  Discussed case with [facility] coordinator, notified that referral to hospice has been placed by NP visiting the home yesterday.  Call placed to Victoria Patrick to follow up on member's discharge and hospice referral.  She report hospice nurse is currently in the home for evaluation.  This care manager will follow up with Victoria Patrick later today to confirm member is active with hospice services.  Will plan to close case at that time.   Call received back from Victoria Patrick to follow up on visit with hospice/palliative care.  She report assessment was done but she is confused as to if member was accepted into program.  She is scheduled to return to [facility] this evening and questioning if they should continue with [facility] for care around the clock.  She does confirm that she was told member was eligible for hospice and nurse would make another visit on Tuesday.  Advised to keep in home care as it currently is as palliative care/hospice does not provide 24 hour aide care in the home.  Also advised to call representative that visited the home today to clarify next steps.  She verbalizes understanding.    Will follow up with sister in law within the next 2-3 business days to inquire about follow up questions and confirm start of hospice services.  Valente David, [place], MSN [place] [phone]

## 2018-07-18 NOTE — Progress Notes (Signed)
COMMUNITY PALLIATIVE CARE SW NOTE  PATIENT NAME: Victoria Patrick DOB: [birth year] MRN: [id number]  PRIMARY CARE PROVIDER: Deland Pretty, MD  RESPONSIBLE PARTY:  Acct ID - Guarantor Home Phone Work Phone Relationship Acct Type  [id number] - [phone]  Self P/F     [address], [place], [place] [address]  Due to the COVID-19 crisis, this virtual check-in visit was done via telephone from my office and it was initiated and consent given by thispatient.   PLAN OF CARE and INTERVENTIONS:             1. GOALS OF CARE/ ADVANCE CARE PLANNING:  Goal is for patient to remain in her home.  She is a DNR. 2. SOCIAL/EMOTIONAL/SPIRITUAL ASSESSMENT/ INTERVENTIONS:  SW conducted a Sales executive visit with patient's sister-in-law, Victoria Patrick.  She is staying with patient in her home for a few more days.  Victoria Patrick lives in [place], [place].  She said she came to [facility] to pick patient up from the nursing home on Tuesday and was not prepared for patient's decreased condition.  She requested SW and Palliative Care RN visit with her and patient tomorrow.  The home visit is scheduled for 10:30.  SW provided active listening and supportive counseling while Victoria Patrick discussed patient's condition. 3. PATIENT/CAREGIVER EDUCATION/ COPING:  Victoria Patrick expresses her feelings and concerns openly. 4. PERSONAL EMERGENCY PLAN:  Family will call EMS. 5. COMMUNITY RESOURCES COORDINATION/ HEALTH CARE NAVIGATION:  Patient has 24/7 care from [facility] 6. FINANCIAL/LEGAL CONCERNS/INTERVENTIONS:  Patient has limited savings.     SOCIAL HX:  Social History   Tobacco Use  . Smoking status: Former Smoker    Packs/day: 0.50    Types: Cigarettes  . Smokeless tobacco: Never Used  . Tobacco comment: quit 30 yrs ago  Substance Use Topics  . Alcohol use: Yes    Comment: no wine lately    CODE STATUS:  DNR  ADVANCED DIRECTIVES:  LW MOST FORM COMPLETE:  N HOSPICE EDUCATION PROVIDED:  N PPS:  Victoria Patrick reports minimal income.   Patient is not ambulating independently. Duration of visit and documentation:  30 minutes.      Creola Corn Ariel Dimitri, LCSW

## 2018-07-18 NOTE — Progress Notes (Signed)
COMMUNITY PALLIATIVE CARE SW NOTE  PATIENT NAME: Victoria Patrick DOB: 10/12/1922 MRN: [id number]  PRIMARY CARE PROVIDER: Deland Pretty, MD  RESPONSIBLE PARTY:  Acct ID - Guarantor Home Phone Work Phone Relationship Acct Type  [id number] - [place],[phone]  Self P/F     [address], [place], [place] [address]     PLAN OF CARE and INTERVENTIONS:             1. GOALS OF CARE/ ADVANCE CARE PLANNING:  Patient's goal is to remain in her home and not to be hospitalized.  Patient has a DNR, LW and HCPOA. 2. SOCIAL/EMOTIONAL/SPIRITUAL ASSESSMENT/ INTERVENTIONS:  SW and Palliative Care RN, Daryl Eastern, met with patient's sister-in-law, [name], and Nora's daughter.  Patient was married to Nora's brother who died 87 years ago.  Patient has no other family.  Her neighbors have been very helpful.  Patient has had a bladder colostomy for four years and was very independent prior to that.  Patient was hospitalized from June 6-11, then discharged to [facility] SNF until last Tuesday, 7/8.  SW and RN provided education regarding resources available to patient and Hospice benefits.  The plan is for patient to continue receiving PT/OT and RN visits for a few more weeks to assess if patient will improve.  Patient is also being treated for a UTI.  Patient requires increased prompting for all tasks. 3. PATIENT/CAREGIVER EDUCATION/ COPING:  Family expresses their feelings openly. 4. PERSONAL EMERGENCY PLAN:  Family will contact EMS. 5. COMMUNITY RESOURCES COORDINATION/ HEALTH CARE NAVIGATION:  [facility] provides sitter service 24/7. 6. FINANCIAL/LEGAL CONCERNS/INTERVENTIONS:  Patient has limited funds.     SOCIAL HX:  Social History   Tobacco Use  . Smoking status: Former Smoker    Packs/day: 0.50    Types: Cigarettes  . Smokeless tobacco: Never Used  . Tobacco comment: quit 30 yrs ago  Substance Use Topics  . Alcohol use: Yes    Comment: no wine lately    CODE STATUS:  DNR  ADVANCED  DIRECTIVES: LW, HCPOA MOST FORM COMPLETE:  N HOSPICE EDUCATION PROVIDED: Y PPS:  Patient's appetite has decreased.  She requires one person assist to stand and pivot. Duration of visit and documentation:  90 minutes.       Creola Corn Jahquan Klugh, LCSW

## 2018-07-19 ENCOUNTER — Other Ambulatory Visit: Payer: Self-pay

## 2018-07-19 DIAGNOSIS — Z741 Need for assistance with personal care: Secondary | ICD-10-CM | POA: Diagnosis not present

## 2018-07-19 DIAGNOSIS — F0150 Vascular dementia without behavioral disturbance: Secondary | ICD-10-CM | POA: Diagnosis not present

## 2018-07-19 DIAGNOSIS — I672 Cerebral atherosclerosis: Secondary | ICD-10-CM | POA: Diagnosis not present

## 2018-07-19 DIAGNOSIS — Z8551 Personal history of malignant neoplasm of bladder: Secondary | ICD-10-CM | POA: Diagnosis not present

## 2018-07-19 DIAGNOSIS — E039 Hypothyroidism, unspecified: Secondary | ICD-10-CM | POA: Diagnosis not present

## 2018-07-19 DIAGNOSIS — I739 Peripheral vascular disease, unspecified: Secondary | ICD-10-CM | POA: Diagnosis not present

## 2018-07-19 DIAGNOSIS — I10 Essential (primary) hypertension: Secondary | ICD-10-CM | POA: Diagnosis not present

## 2018-07-19 DIAGNOSIS — Z436 Encounter for attention to other artificial openings of urinary tract: Secondary | ICD-10-CM | POA: Diagnosis not present

## 2018-07-19 DIAGNOSIS — Z87440 Personal history of urinary (tract) infections: Secondary | ICD-10-CM | POA: Diagnosis not present

## 2018-07-19 DIAGNOSIS — Z681 Body mass index (BMI) 19 or less, adult: Secondary | ICD-10-CM | POA: Diagnosis not present

## 2018-07-19 DIAGNOSIS — Z8500 Personal history of malignant neoplasm of unspecified digestive organ: Secondary | ICD-10-CM | POA: Diagnosis not present

## 2018-07-19 NOTE — Progress Notes (Signed)
COMMUNITY PALLIATIVE CARE RN NOTE  PATIENT NAME: Victoria Patrick DOB: May 22, 1922 MRN: [id number]  PRIMARY CARE PROVIDER: Deland Pretty, MD  RESPONSIBLE PARTY: Harland Dingwall (sister-in-law) Acct ID - Guarantor Home Phone Work Phone Relationship Acct Type  [id number] - Baskins,[phone]  Self P/F     [address], [place], [place] [address]   Covid-19 Pre-screening Negative  PLAN OF CARE and INTERVENTION:  1. ADVANCE CARE PLANNING/GOALS OF CARE:Goal is for patient to remain in her home with 24/7 caregivers. She is a DNR. 2. PATIENT/CAREGIVER EDUCATION: Explained Palliative Services, Hospice Education 3. DISEASE STATUS: Joint visit made with Palliative Care SW, Lynn Duffy. Patient's sister-in-law, [name], and niece-in-law, Mordecai Rasmussen, present during visit. Health history provided by family. Patient was recently hospitalized on 06/17/18 after having a syncopal episode in a store. She was then discharged from the hospital to [facility] for Rehab. She recently returned to her home with 24/7 caregivers 2 days ago. Family reports that prior to patient going into the hospital, she was ambulatory and moving about well and much more independent. When they picked her up 2 days ago from the SNF, patient was very weak, lethargic and actually "blacked out" briefly when trying to get her into the house. She now requires assistance with everything. Family did not expect such a decline in condition. Patient has a very poor short term memory and is more confused than her baseline. She requires constant prompting with each activity. She is currently being treated for a UTI with Keflex 500 mg orally twice daily x 7 days. This is Day 2 of antibiotics. She has an urostomy. Stoma is pink and moist and urine looks slightly better per niece. Currently dark yellow with some sediment noted. Taught niece how to secure the urostomy bag. She has had 600-843ml of urine output over the last 22 hours. Her appetite has been  poor. This morning she has taken in about 8-10oz of water and taken a few bites of applesauce. Family trying to push fluids. She has Equate nutritional shakes for added nutrition. She is currently working with home health PT/OT. Therapist was able to get patient up yesterday to the side of the bed, stand and take a few steps. During my assessment, patient said that she has to use to the bathroom. She required 2 person assistance in being transferred to the bedside commode. She is able to bear weight, and take small steps with someone holding onto her. Otherwise she would fall. She had a very large BM. She required assistance in placing her legs back onto the bed. No skin issues at this time. Family reports that patient rolled off the bed Tuesday night, without injury. She now has a bed rail. Provided information regarding hospice. Family wanted to continue Physical Therapy for a few more sessions to see if patient would improve any, but is very realistic that her condition may not improve much. Will continue to monitor.   UPDATE: Received a phone call late this evening from [name] stating that Dr. Shelia Media contacted her this evening, and based on patient's overall condition he feels patient needs to be under hospice care and is going to send a Hospice referral in today and try to get the hospice AV scheduled for tomorrow. Both [name] and Mordecai Rasmussen live out of town and have to return back to [place], [place] tomorrow.    CODE STATUS: DNR ADVANCED DIRECTIVES: Y (HCPOA/Living Will) MOST FORM: no PPS: 30%   PHYSICAL EXAM:   VITALS: Today's Vitals  07/18/18 1109  BP: 90/67  Pulse: 94  Resp: 16  Temp: (!) 97.1 F (36.2 C)  TempSrc: Temporal  SpO2: 96%  PainSc: 2   PainLoc: Generalized    LUNGS: clear to auscultation  CARDIAC: Cor RRR EXTREMITIES: No edema SKIN: Skin is dry and intact, Urostomy stump pink and moist, No skin breakdown.   NEURO: Alert and oriented x 2 (person/place), intermittently  confused, forgetful, increased generalized weakness, requires extensive assist with transfers   (Duration of visit and documentation 100 minutes)    Daryl Eastern, RN BSN

## 2018-07-22 ENCOUNTER — Other Ambulatory Visit: Payer: Self-pay | Admitting: *Deleted

## 2018-07-22 DIAGNOSIS — I10 Essential (primary) hypertension: Secondary | ICD-10-CM | POA: Diagnosis not present

## 2018-07-22 DIAGNOSIS — I672 Cerebral atherosclerosis: Secondary | ICD-10-CM | POA: Diagnosis not present

## 2018-07-22 DIAGNOSIS — I739 Peripheral vascular disease, unspecified: Secondary | ICD-10-CM | POA: Diagnosis not present

## 2018-07-22 DIAGNOSIS — Z87440 Personal history of urinary (tract) infections: Secondary | ICD-10-CM | POA: Diagnosis not present

## 2018-07-22 DIAGNOSIS — F0150 Vascular dementia without behavioral disturbance: Secondary | ICD-10-CM | POA: Diagnosis not present

## 2018-07-22 DIAGNOSIS — Z8551 Personal history of malignant neoplasm of bladder: Secondary | ICD-10-CM | POA: Diagnosis not present

## 2018-07-22 NOTE — Patient Outreach (Signed)
[facility]) Care Management  07/22/2018  Victoria Patrick 08/09/1922 [id number]   Call placed to member's caregiver/sister in law Victoria Patrick to follow up on involvement with hospice.  She confirms that member is now active with [facility] for hospice services.  Denies any further questions at this time.  Will close case and notify primary MD of case closure.  Valente David, [place], MSN [place] [phone]

## 2018-07-23 DIAGNOSIS — I10 Essential (primary) hypertension: Secondary | ICD-10-CM | POA: Diagnosis not present

## 2018-07-23 DIAGNOSIS — Z8551 Personal history of malignant neoplasm of bladder: Secondary | ICD-10-CM | POA: Diagnosis not present

## 2018-07-23 DIAGNOSIS — Z87440 Personal history of urinary (tract) infections: Secondary | ICD-10-CM | POA: Diagnosis not present

## 2018-07-23 DIAGNOSIS — I672 Cerebral atherosclerosis: Secondary | ICD-10-CM | POA: Diagnosis not present

## 2018-07-23 DIAGNOSIS — I739 Peripheral vascular disease, unspecified: Secondary | ICD-10-CM | POA: Diagnosis not present

## 2018-07-23 DIAGNOSIS — F0150 Vascular dementia without behavioral disturbance: Secondary | ICD-10-CM | POA: Diagnosis not present

## 2018-07-24 DIAGNOSIS — F0150 Vascular dementia without behavioral disturbance: Secondary | ICD-10-CM | POA: Diagnosis not present

## 2018-07-24 DIAGNOSIS — I672 Cerebral atherosclerosis: Secondary | ICD-10-CM | POA: Diagnosis not present

## 2018-07-24 DIAGNOSIS — I10 Essential (primary) hypertension: Secondary | ICD-10-CM | POA: Diagnosis not present

## 2018-07-24 DIAGNOSIS — Z8551 Personal history of malignant neoplasm of bladder: Secondary | ICD-10-CM | POA: Diagnosis not present

## 2018-07-24 DIAGNOSIS — Z87440 Personal history of urinary (tract) infections: Secondary | ICD-10-CM | POA: Diagnosis not present

## 2018-07-24 DIAGNOSIS — I739 Peripheral vascular disease, unspecified: Secondary | ICD-10-CM | POA: Diagnosis not present

## 2018-07-31 DIAGNOSIS — Z8551 Personal history of malignant neoplasm of bladder: Secondary | ICD-10-CM | POA: Diagnosis not present

## 2018-07-31 DIAGNOSIS — I672 Cerebral atherosclerosis: Secondary | ICD-10-CM | POA: Diagnosis not present

## 2018-07-31 DIAGNOSIS — I10 Essential (primary) hypertension: Secondary | ICD-10-CM | POA: Diagnosis not present

## 2018-07-31 DIAGNOSIS — F0150 Vascular dementia without behavioral disturbance: Secondary | ICD-10-CM | POA: Diagnosis not present

## 2018-07-31 DIAGNOSIS — Z87440 Personal history of urinary (tract) infections: Secondary | ICD-10-CM | POA: Diagnosis not present

## 2018-07-31 DIAGNOSIS — I739 Peripheral vascular disease, unspecified: Secondary | ICD-10-CM | POA: Diagnosis not present

## 2018-08-01 DIAGNOSIS — I672 Cerebral atherosclerosis: Secondary | ICD-10-CM | POA: Diagnosis not present

## 2018-08-01 DIAGNOSIS — N390 Urinary tract infection, site not specified: Secondary | ICD-10-CM | POA: Diagnosis not present

## 2018-08-01 DIAGNOSIS — I739 Peripheral vascular disease, unspecified: Secondary | ICD-10-CM | POA: Diagnosis not present

## 2018-08-01 DIAGNOSIS — F0150 Vascular dementia without behavioral disturbance: Secondary | ICD-10-CM | POA: Diagnosis not present

## 2018-08-01 DIAGNOSIS — I950 Idiopathic hypotension: Secondary | ICD-10-CM | POA: Diagnosis not present

## 2018-08-01 DIAGNOSIS — Z8551 Personal history of malignant neoplasm of bladder: Secondary | ICD-10-CM | POA: Diagnosis not present

## 2018-08-01 DIAGNOSIS — F0390 Unspecified dementia without behavioral disturbance: Secondary | ICD-10-CM | POA: Diagnosis not present

## 2018-08-01 DIAGNOSIS — I10 Essential (primary) hypertension: Secondary | ICD-10-CM | POA: Diagnosis not present

## 2018-08-01 DIAGNOSIS — Z87440 Personal history of urinary (tract) infections: Secondary | ICD-10-CM | POA: Diagnosis not present

## 2018-08-03 DIAGNOSIS — F0150 Vascular dementia without behavioral disturbance: Secondary | ICD-10-CM | POA: Diagnosis not present

## 2018-08-03 DIAGNOSIS — I672 Cerebral atherosclerosis: Secondary | ICD-10-CM | POA: Diagnosis not present

## 2018-08-03 DIAGNOSIS — Z8551 Personal history of malignant neoplasm of bladder: Secondary | ICD-10-CM | POA: Diagnosis not present

## 2018-08-03 DIAGNOSIS — I10 Essential (primary) hypertension: Secondary | ICD-10-CM | POA: Diagnosis not present

## 2018-08-03 DIAGNOSIS — Z87440 Personal history of urinary (tract) infections: Secondary | ICD-10-CM | POA: Diagnosis not present

## 2018-08-03 DIAGNOSIS — I739 Peripheral vascular disease, unspecified: Secondary | ICD-10-CM | POA: Diagnosis not present

## 2018-08-04 DIAGNOSIS — Z87440 Personal history of urinary (tract) infections: Secondary | ICD-10-CM | POA: Diagnosis not present

## 2018-08-04 DIAGNOSIS — Z8551 Personal history of malignant neoplasm of bladder: Secondary | ICD-10-CM | POA: Diagnosis not present

## 2018-08-04 DIAGNOSIS — I10 Essential (primary) hypertension: Secondary | ICD-10-CM | POA: Diagnosis not present

## 2018-08-04 DIAGNOSIS — F0150 Vascular dementia without behavioral disturbance: Secondary | ICD-10-CM | POA: Diagnosis not present

## 2018-08-04 DIAGNOSIS — I739 Peripheral vascular disease, unspecified: Secondary | ICD-10-CM | POA: Diagnosis not present

## 2018-08-04 DIAGNOSIS — I672 Cerebral atherosclerosis: Secondary | ICD-10-CM | POA: Diagnosis not present

## 2018-08-10 DEATH — deceased

## 2018-09-16 NOTE — Progress Notes (Deleted)
PATIENT: Victoria Patrick DOB: 1922/11/17  REASON FOR VISIT: follow up HISTORY FROM: patient  HISTORY OF PRESENT ILLNESS: Today 09/16/18  Victoria Patrick is a 85 year old female with history of a progressive memory disturbance.  In April 2020 she had a progressive decline in her memory.  Laboratory evaluation was unremarkable with the exception of mild chronic renal insufficiency.  She was admitted to the hospital in June 2020 for syncopal episode, found to be orthostatic and had urinary tract infection.  She was discharged to SNF.  She is now under the care of hospice.  HISTORY 03/14/2018 Dr. Jannifer [place]: Victoria Patrick is a 85 year old right-handed white female with a history of a progressive memory disturbance.  The patient was seen about 6 months ago, she has been able to maintain her weight since then, she has not wanted to go on any medications for memory.  She lives alone, but her sister and a neighbor may look in on her, she will occasionally miss a dose of medications.  She does have a pill dispenser that her sister loads once a week.  She operates a motor vehicle going short distances to the bank or to the grocery store, she cooks for herself.  She does her own finances and yard work.  Overall, she has been very healthy.  She returns to this office for an evaluation.  Her sister who accompanies her today indicates that she is repeating herself frequently.  There have been no definite safety issues with driving.  REVIEW OF SYSTEMS: Out of a complete 14 system review of symptoms, the patient complains only of the following symptoms, and all other reviewed systems are negative.  ALLERGIES: Allergies  Allergen Reactions  . Diovan [Valsartan] Itching  . Codeine Nausea And Vomiting    HOME MEDICATIONS: Outpatient Medications Prior to Visit  Medication Sig Dispense Refill  . Calcium Citrate-Vitamin D (CALCIUM CITRATE + PO) Take 630 mg by mouth daily.    . cholecalciferol (VITAMIN D3) 25 MCG (1000 UT)  tablet Take 1 tablet (1,000 Units total) by mouth daily. 30 tablet 0  . hydrocortisone (ANUSOL-HC) 25 MG suppository Place 1 suppository (25 mg total) rectally 2 (two) times daily. 12 suppository 0  . levothyroxine (SYNTHROID) 100 MCG tablet Take 1 tablet (100 mcg total) by mouth daily before breakfast. 300 tablet 0  . losartan (COZAAR) 100 MG tablet Take 0.5 tablets (50 mg total) by mouth daily. 15 tablet 0  . Multiple Vitamin (MULTIVITAMIN WITH MINERALS) TABS tablet Take 1 tablet by mouth daily. 30 tablet 0  . polyethylene glycol (MIRALAX / GLYCOLAX) 17 g packet Take 17 g by mouth 2 (two) times daily. 14 each 0  . senna (SENOKOT) 8.6 MG TABS tablet Take 1 tablet (8.6 mg total) by mouth 2 (two) times daily. 120 each 0   No facility-administered medications prior to visit.     PAST MEDICAL HISTORY: Past Medical History:  Diagnosis Date  . Barrett's esophagus   . Bladder cancer ([facility]) 04/30/2014  . Dysrhythmia    occasional skip   . Gastric mass   . 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 Hypertension   . Hypothyroidism   . Memory disorder 08/30/2017  . Skin cancer    ON FACE    PAST SURGICAL HISTORY: Past Surgical History:  Procedure Laterality Date  . CATARACT EXTRACTION, BILATERAL    . COLONOSCOPY    . CYSTOSCOPY N/A 04/30/2014   Procedure: CYSTOSCOPY;  Surgeon: Irine Seal,  MD;  Location: [facility];  Service: Urology;  Laterality: N/A;  . CYSTOSCOPY WITH INJECTION N/A 06/26/2014   Procedure: CYSTOSCOPY WITH INJECTION OF INDOCYANINE GREEN DYE;  Surgeon: Alexis Frock, MD;  Location: [facility];  Service: Urology;  Laterality: N/A;  . EUS N/A 09/28/2016   Procedure: UPPER ENDOSCOPIC ULTRASOUND (EUS) LINEAR;  Surgeon: [name], MD;  Location: [facility];  Service: Endoscopy;  Laterality: N/A;  . hx of skin cancer removal     locally done   . IR URETERAL STENT LEFT NEW ACCESS W/O SEP NEPHROSTOMY CATH  09/07/2017  . NEPHROLITHOTOMY Left 09/07/2017    Procedure: LEFT ANTEGRADE URETEROSCOPY, DILATING OF STRICTURE;  Surgeon: Alexis Frock, MD;  Location: [facility];  Service: Urology;  Laterality: Left;  . ROBOT ASSISTED LAPAROSCOPIC COMPLETE CYSTECT ILEAL CONDUIT N/A 06/26/2014   Procedure: ROBOTIC ASSISTED LAPAROSCOPIC COMPLETE CYSTECT ILEAL CONDUIT;  Surgeon: Alexis Frock, MD;  Location: [facility];  Service: Urology;  Laterality: N/A;  . ROBOTIC ASSISTED LAPAROSCOPIC HYSTERECTOMY AND SALPINGECTOMY N/A 06/26/2014   Procedure: ROBOTIC ASSISTED LAPAROSCOPIC HYSTERECTOMY AND BILATERAL SALPINGO-OPHERECTOMY;  Surgeon: Alexis Frock, MD;  Location: [facility];  Service: Urology;  Laterality: N/A;  . TRANSURETHRAL RESECTION OF BLADDER TUMOR N/A 04/30/2014   Procedure: TRANSURETHRAL RESECTION OF BLADDER TUMOR (TURBT);  Surgeon: Irine Seal, MD;  Location: [facility];  Service: Urology;  Laterality: N/A;  . UPPER GASTROINTESTINAL ENDOSCOPY      FAMILY HISTORY: Family History  Problem Relation Age of Onset  . Stroke Maternal Aunt   . Hypertension Mother   . Bladder Cancer Neg Hx   . Stomach cancer Neg Hx   . Colon cancer Neg Hx   . Esophageal cancer Neg Hx   . Rectal cancer Neg Hx     SOCIAL HISTORY: Social History   Socioeconomic History  . Marital status: Widowed    Spouse name: Not on file  . Number of children: Not on file  . Years of education: Not on file  . Highest education level: Not on file  Occupational History  . Not on file  Social Needs  . Financial resource strain: Not on file  . Food insecurity    Worry: Not on file    Inability: Not on file  . Transportation needs    Medical: Not on file    Non-medical: Not on file  Tobacco Use  . Smoking status: Former Smoker    Packs/day: 0.50    Types: Cigarettes  . Smokeless tobacco: Never Used  . Tobacco comment: quit 30 yrs ago  Substance and Sexual Activity  . Alcohol use: Yes    Comment: no wine lately  . Drug use: No  . Sexual activity: Never    Partners: Male  Lifestyle  .  Physical activity    Days per week: Not on file    Minutes per session: Not on file  . Stress: Not on file  Relationships  . Social Herbalist on phone: Not on file    Gets together: Not on file    Attends religious service: Not on file    Active member of club or organization: Not on file    Attends meetings of clubs or organizations: Not on file    Relationship status: Not on file  . Intimate partner violence    Fear of current or ex partner: Not on file    Emotionally abused: Not on file    Physically abused: Not on file    Forced  sexual activity: Not on file  Other Topics Concern  . Not on file  Social History Narrative   Right handed    Lives alone   Caffeine use: 2 cups coffee per day   1 glass tea per day      PHYSICAL EXAM  There were no vitals filed for this visit. There is no height or weight on file to calculate BMI.  Generalized: Well developed, in no acute distress   Neurological examination  Mentation: Alert oriented to time, place, history taking. Follows all commands speech and language fluent Cranial nerve II-XII: Pupils were equal round reactive to light. Extraocular movements were full, visual field were full on confrontational test. Facial sensation and strength were normal. Uvula tongue midline. Head turning and shoulder shrug  were normal and symmetric. Motor: The motor testing reveals 5 over 5 strength of all 4 extremities. Good symmetric motor tone is noted throughout.  Sensory: Sensory testing is intact to soft touch on all 4 extremities. No evidence of extinction is noted.  Coordination: Cerebellar testing reveals good finger-nose-finger and heel-to-shin bilaterally.  Gait and station: Gait is normal. Tandem gait is normal. Romberg is negative. No drift is seen.  Reflexes: Deep tendon reflexes are symmetric and normal bilaterally.   DIAGNOSTIC DATA (LABS, IMAGING, TESTING) - I reviewed patient records, labs, notes, testing and imaging  myself where available.  Lab Results  Component Value Date   WBC 7.8 06/19/2018   HGB 9.6 (L) 06/19/2018   HCT 30.8 (L) 06/19/2018   MCV 91.4 06/19/2018   PLT PLATELET CLUMPS NOTED ON SMEAR, UNABLE TO ESTIMATE 06/19/2018      Component Value Date/Time   NA 136 06/20/2018 0604   NA 139 05/07/2018 1209   K 3.7 06/20/2018 0604   CL 104 06/20/2018 0604   CO2 22 06/20/2018 0604   GLUCOSE 112 (H) 06/20/2018 0604   BUN 18 06/20/2018 0604   BUN 20 05/07/2018 1209   CREATININE 0.90 06/22/2018 0427   CALCIUM 8.0 (L) 06/20/2018 0604   PROT 6.3 (L) 06/18/2018 0310   PROT 7.0 05/07/2018 1209   ALBUMIN 2.8 (L) 06/18/2018 0310   ALBUMIN 4.2 05/07/2018 1209   AST 14 (L) 06/18/2018 0310   ALT 9 06/18/2018 0310   ALKPHOS 63 06/18/2018 0310   BILITOT 0.3 06/18/2018 0310   BILITOT 0.4 05/07/2018 1209   GFRNONAA 55 (L) 06/22/2018 0427   GFRAA >60 06/22/2018 0427   No results found for: CHOL, HDL, LDLCALC, LDLDIRECT, TRIG, CHOLHDL No results found for: HGBA1C Lab Results  Component Value Date   VITAMINB12 >2000 (H) 05/07/2018   Lab Results  Component Value Date   TSH 7.695 (H) 06/18/2018      ASSESSMENT AND PLAN 85 y.o. year old female  has a past medical history of Barrett's esophagus, Bladder cancer ([facility]) (04/30/2014), Dysrhythmia, Gastric mass, Gastrointestinal stromal tumor (GIST) ([facility]), Hypertension, Hypothyroidism, Memory disorder (08/30/2017), and Skin cancer. here with ***   I spent 15 minutes with the patient. 50% of this time was spent   Butler Denmark, [place], DNP 09/16/2018, 1:07 PM [facility] [address] [place], [place] [address] [phone]

## 2018-09-17 ENCOUNTER — Telehealth: Payer: Self-pay

## 2018-09-17 ENCOUNTER — Encounter: Payer: Self-pay | Admitting: Neurology

## 2018-09-17 ENCOUNTER — Ambulatory Visit: Payer: Medicare Other | Admitting: Neurology

## 2018-09-17 NOTE — Telephone Encounter (Signed)
Patient was a no call/no show for their appointment today.   

## 2018-10-09 ENCOUNTER — Telehealth: Payer: Self-pay | Admitting: Neurology

## 2018-10-09 NOTE — Telephone Encounter (Signed)
Pt's niece called wanting to inform us that she received a no show letter for her aunt and she wanted [place] to know that the reason she missed the appt was that she had passed away 08-06-2022 @12 :30am.

## 2018-10-29 IMAGING — CT CT RENAL STONE PROTOCOL
2 of 4 series · 15 of 46 positions shown, 17 images · non-contrast
Comparison: Contrast-enhanced CT 08/11/2016

CLINICAL DATA: [AGE] with flank and abdominal pain. Stone
disease suspected. Nausea.

EXAM:
CT ABDOMEN AND PELVIS WITHOUT CONTRAST
TECHNIQUE: Multidetector CT imaging of the abdomen and pelvis was performed
following the standard protocol without IV contrast.

[Series 3: stone study 5.0 i30f 2 · axial · 0.79mm/px · z∈[+1076,+1461]mm · 12 of 89 slices shown, 14 images]
[im 8/89  soft-tissue]
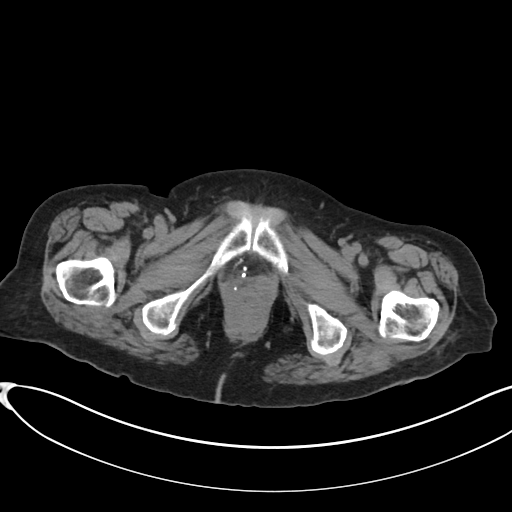
[im 8/89  bone]
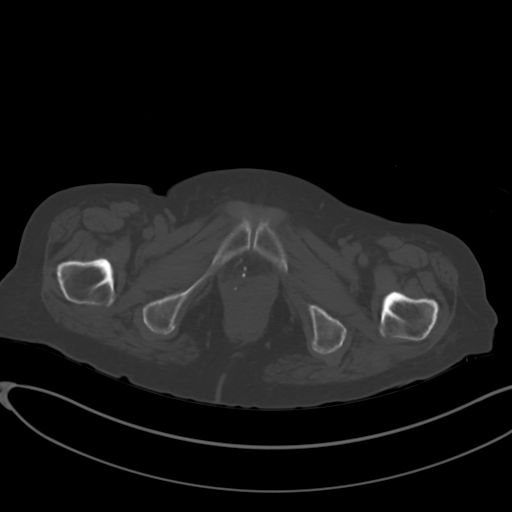
[im 15/89  soft-tissue]
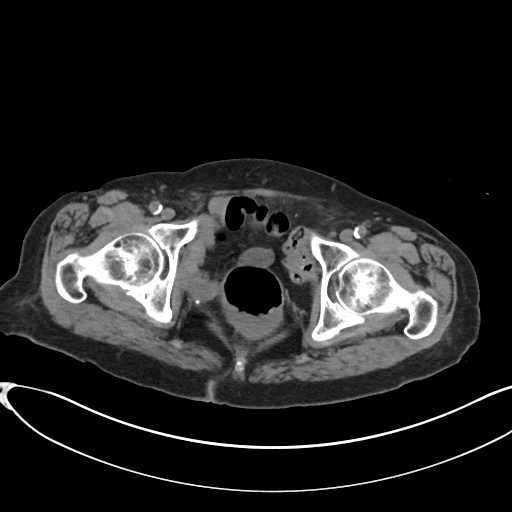
[im 22/89  soft-tissue]
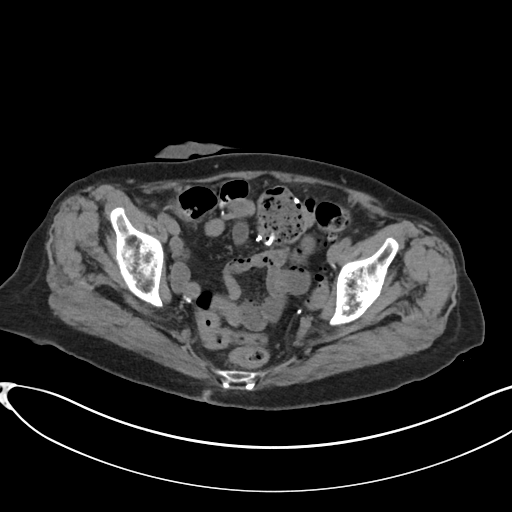
[im 29/89  soft-tissue]
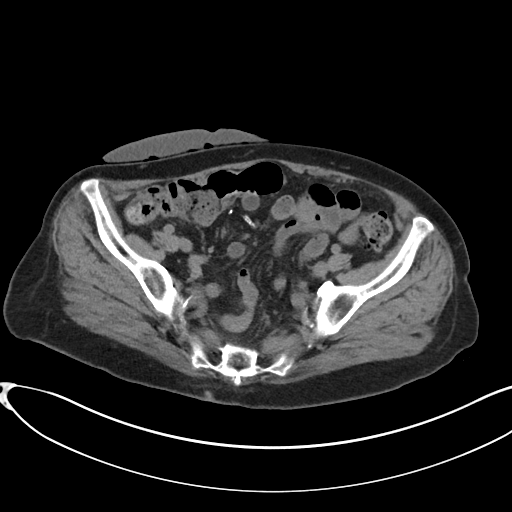
[im 36/89  soft-tissue]
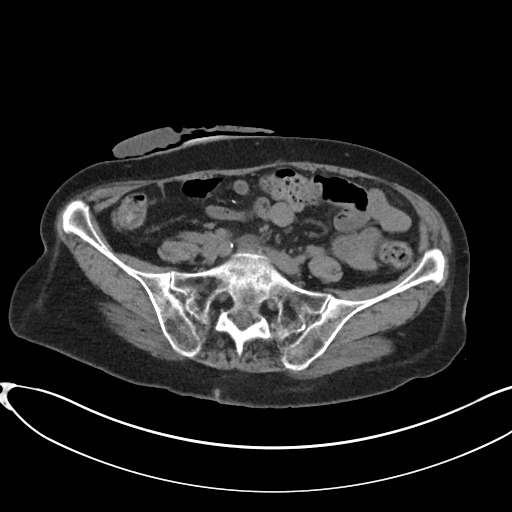
[im 43/89  soft-tissue]
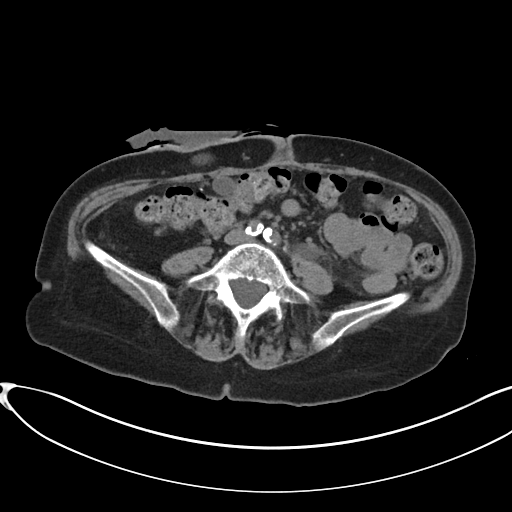
[im 50/89  soft-tissue]
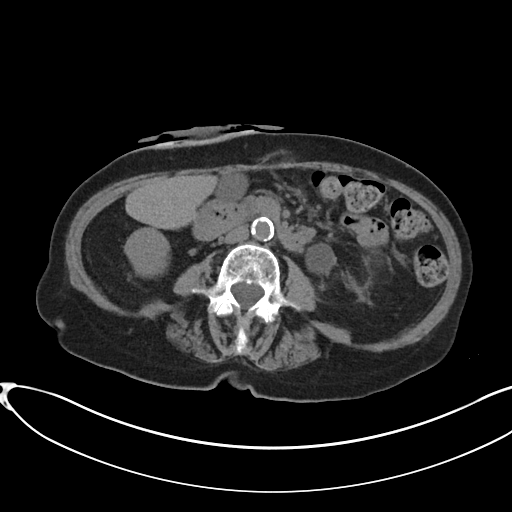
[im 57/89  soft-tissue]
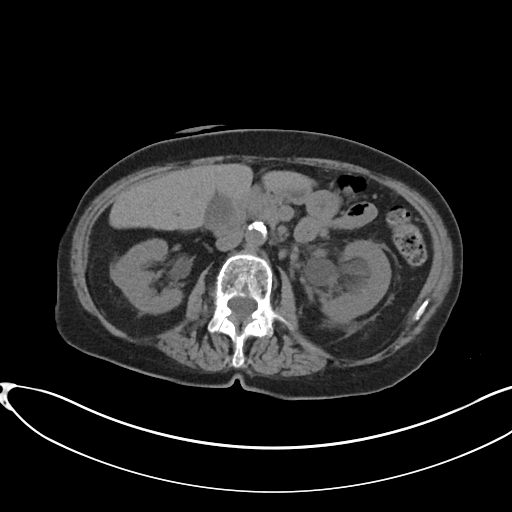
[im 64/89  soft-tissue]
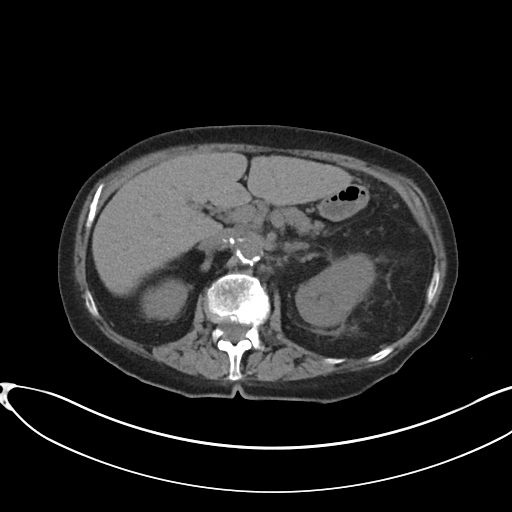
[im 64/89  bone]
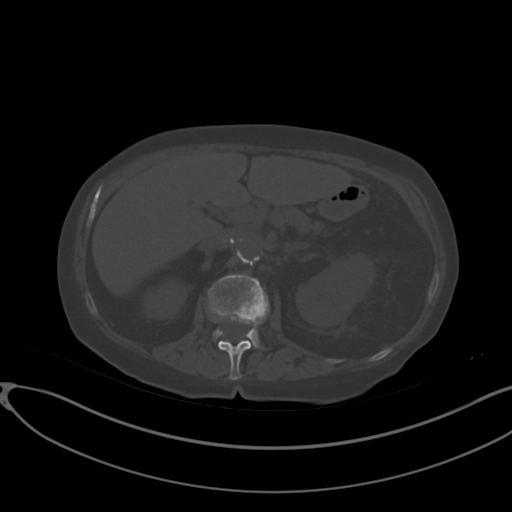
[im 71/89  soft-tissue]
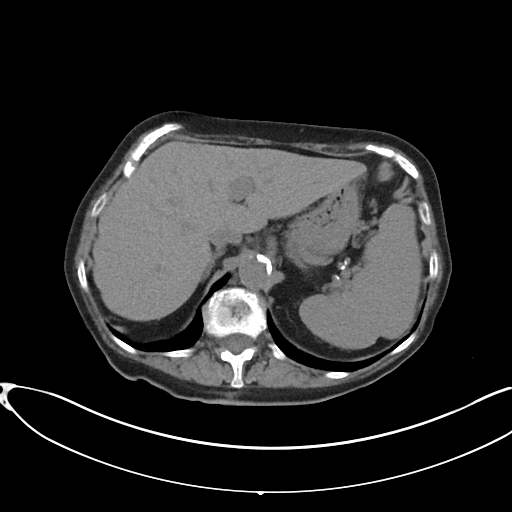
[im 78/89  soft-tissue]
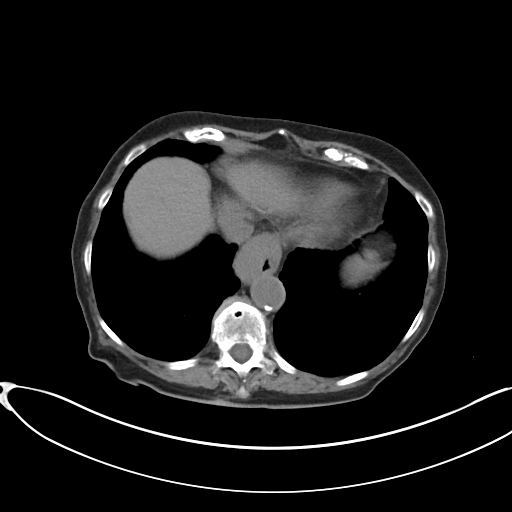
[im 85/89  soft-tissue]
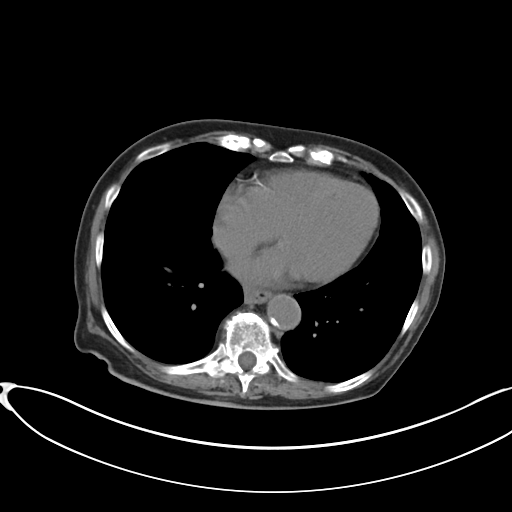

[Series 6: coronal soft tissue · coronal · 0.85mm/px · 3 of 72 slices shown]
[im 24/72  soft-tissue]
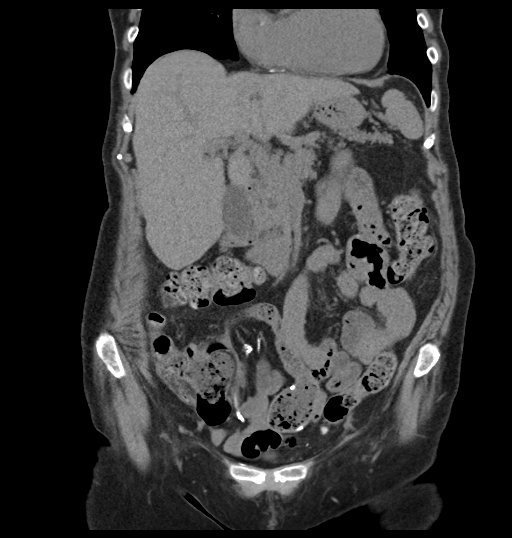
[im 32/72  soft-tissue]
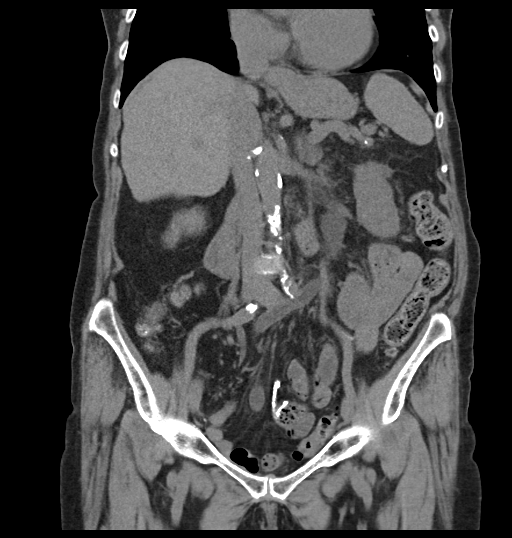
[im 40/72  soft-tissue]
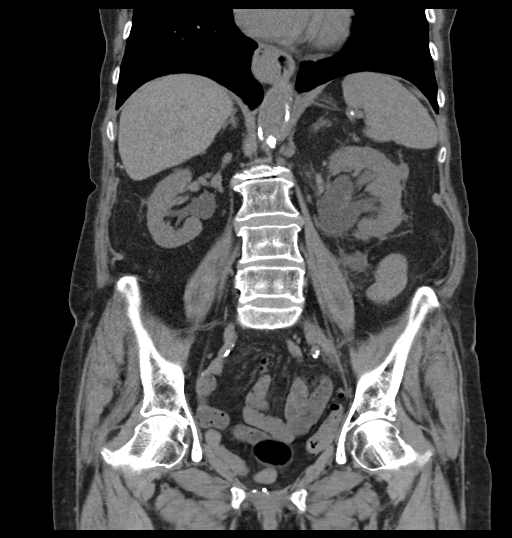

[15 of 46 positions shown; findings below may reference images not displayed]

FINDINGS: Lower chest: Mild cardiomegaly with coronary artery calcifications.
Small to moderate hiatal hernia which is increased in size from
prior CT. No pleural fluid or consolidation.

Hepatobiliary: Scattered hepatic granulomas. No focal lesion on
noncontrast exam. Gallbladder physiologically distended, no
calcified stone. No biliary dilatation.

Pancreas: No ductal dilatation or inflammation.

Spleen: Faint capsular calcifications anteriorly, unchanged. Normal
in size.

Adrenals/Urinary Tract: Normal right adrenal gland. Unchanged
low-density 16 mm left adrenal nodule. Patient is post cystectomy
with ileal conduit. There is left hydroureteronephrosis with diffuse
ureteral dilatation, transition at the ileal conduit anastomosis. No
urolithiasis. Moderate left perinephric edema. Punctate parenchymal
calcification in the left kidney is unchanged. Small exophytic
lesion from the posterolateral left kidney has slightly decreased
attenuation from prior exam. Additional smaller lesions are not as
well characterized currently given lack contrast, better grossly
similar. Minimal right hydronephrosis and prominence of the ureter
to a lesser extent. Minimal right perinephric edema appears similar
to prior exam. The ileal conduit is physiologically distended.

Stomach/Bowel: Increased size of small to moderate hiatal hernia.
Stomach is otherwise decompressed. No small bowel inflammation or
obstruction. Fecalization of small bowel contents at the ileal
anastomosis without proximal dilatation. Colonic diverticulosis most
prominent in the sigmoid without diverticulitis. Appendix not
definitively visualized, no secondary findings of appendicitis.

Vascular/Lymphatic: Advanced aortic and branch atherosclerosis. No
aneurysm. Lack contrast limits assessment for adenopathy, allowing
for this, no bulky adenopathy.

Reproductive: Status post hysterectomy. No adnexal masses.

Other: Umbilical hernia contains fat and anti mesenteric border of
transverse colon, no colonic wall thickening or obstruction. No
ascites or free air. No intra-abdominal abscess.

Musculoskeletal: There are no acute or suspicious osseous
abnormalities. Degenerative change in the spine with Modic endplate
changes at L1-L2.
IMPRESSION: 1. Post cystectomy with ileal conduit. Moderate left
hydroureteronephrosis with transition at the ureteral conduit
anastomosis. Considerations include stricture or urothelial lesion.
No obstructing stone. Minimal right hydronephrosis and ureteral
prominence as well.
2. Small to moderate hiatal hernia which has increased in size from
exam 1 year prior.
3. Chronic findings of colonic diverticulosis, small
hyperattenuating renal lesions likely complex cysts, and Aortic
Atherosclerosis ([id number]).

## 2018-12-01 IMAGING — US IR URETURAL STENT LEFT NEW ACCESS W/O SEP NEPHROSTOMY CATH
1 series · 1 of 1 positions shown · non-contrast
Comparison: None.

INDICATION: [AGE] with history of bladder cancer and status post
cystectomy with ileal conduit. Patient has left hydronephrosis and
concern for an anastomosis stricture or possibly tumor recurrence.
Request for percutaneous access for an antegrade ureteroscopy in the
OR.

EXAM:
PLACEMENT OF LEFT NEPHROURETERAL CATHETER WITH ULTRASOUND AND
FLUOROSCOPIC GUIDANCE

[Series 1: ir uretural stent left new access w/o sep nephrost · 0.16mm/px · 1 of 1 slices shown]
[im 1/1]
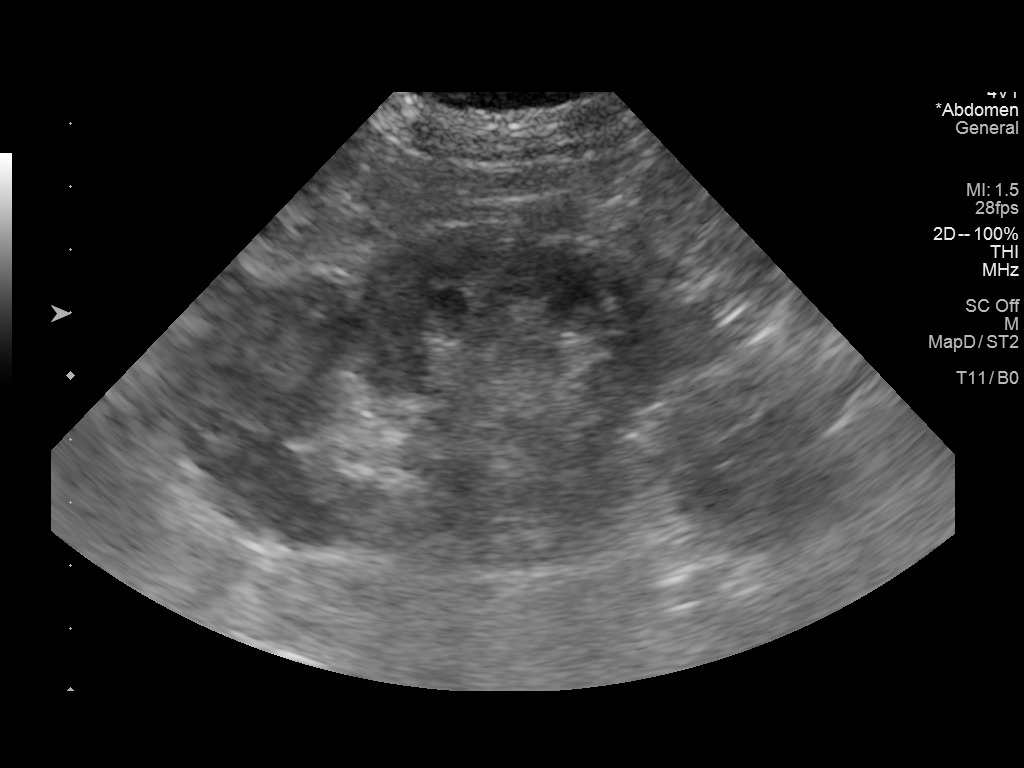

[1 of 1 positions shown; findings below may reference images not displayed]

MEDICATIONS:
Ancef 2 g; The antibiotic was administered in an appropriate time
frame prior to skin puncture.

ANESTHESIA/SEDATION:
Fentanyl 100 mcg IV; Versed 4 mg IV

Moderate Sedation Time:  55 minutes

The patient was continuously monitored during the procedure by the
interventional radiology nurse under my direct supervision.

CONTRAST:  25 mL [id number]-administered into the collecting
system(s)

FLUOROSCOPY TIME:  Fluoroscopy Time: 16 minutes 50 mGy

COMPLICATIONS:
None immediate.

PROCEDURE:
Informed written consent was obtained from the patient after a
thorough discussion of the procedural risks, benefits and
alternatives. All questions were addressed. Maximal Sterile Barrier
Technique was utilized including caps, mask, sterile gowns, sterile
gloves, sterile drape, hand hygiene and skin antiseptic. A timeout
was performed prior to the initiation of the procedure.

Patient was placed prone. The left flank was prepped and draped in
sterile fashion. Ultrasound was used to identify the left kidney.
Minimal hydronephrosis was noted. Skin was anesthetized with 1%
lidocaine. 22 gauge Chiba needle was directed into the lower pole
towards a lower pole calyx with ultrasound guidance. Contrast
injection confirmed placement in the collecting system. A 0.018 wire
was successfully advanced into the renal collecting system and
Accustick dilator set was placed. Tract was dilated to accommodate a
4 French glide catheter which was advanced down the ureter with a
Bentson wire. The catheter and wire was successfully advanced across
the anastomosis into the ileal conduit. Catheter was successfully
advanced out of the ileal conduit into the urostomy bag. This
catheter was exchanged for a 5 French catheter over a Glidewire.
Catheter was sutured to skin. Catheter was capped.
FINDINGS: Mild dilatation of the left renal pelvis with ultrasound. No
calyceal dilatation. Access was obtained from the lower pole.
Nephrostogram images demonstrated mild dilatation of the left renal
collecting system. There is reflux into the right renal collecting
system. Contrast drains into the ileal conduit. Catheter and wire
easily advanced across the ureter anastomosis.
IMPRESSION: Successful placement of left nephroureteral catheter with ultrasound
and fluoroscopic guidance. Catheter was advanced into the ileal
conduit and out to the urostomy bag.

Minimal dilatation of left renal collecting system without a
high-grade stricture or stenosis.

## 2019-09-11 IMAGING — DX ABDOMEN - 1 VIEW
1 series · 1 of 1 positions shown · non-contrast
Comparison: 06/17/2018

CLINICAL DATA: Obstipation

EXAM:
ABDOMEN - 1 VIEW

[abdomen kub]
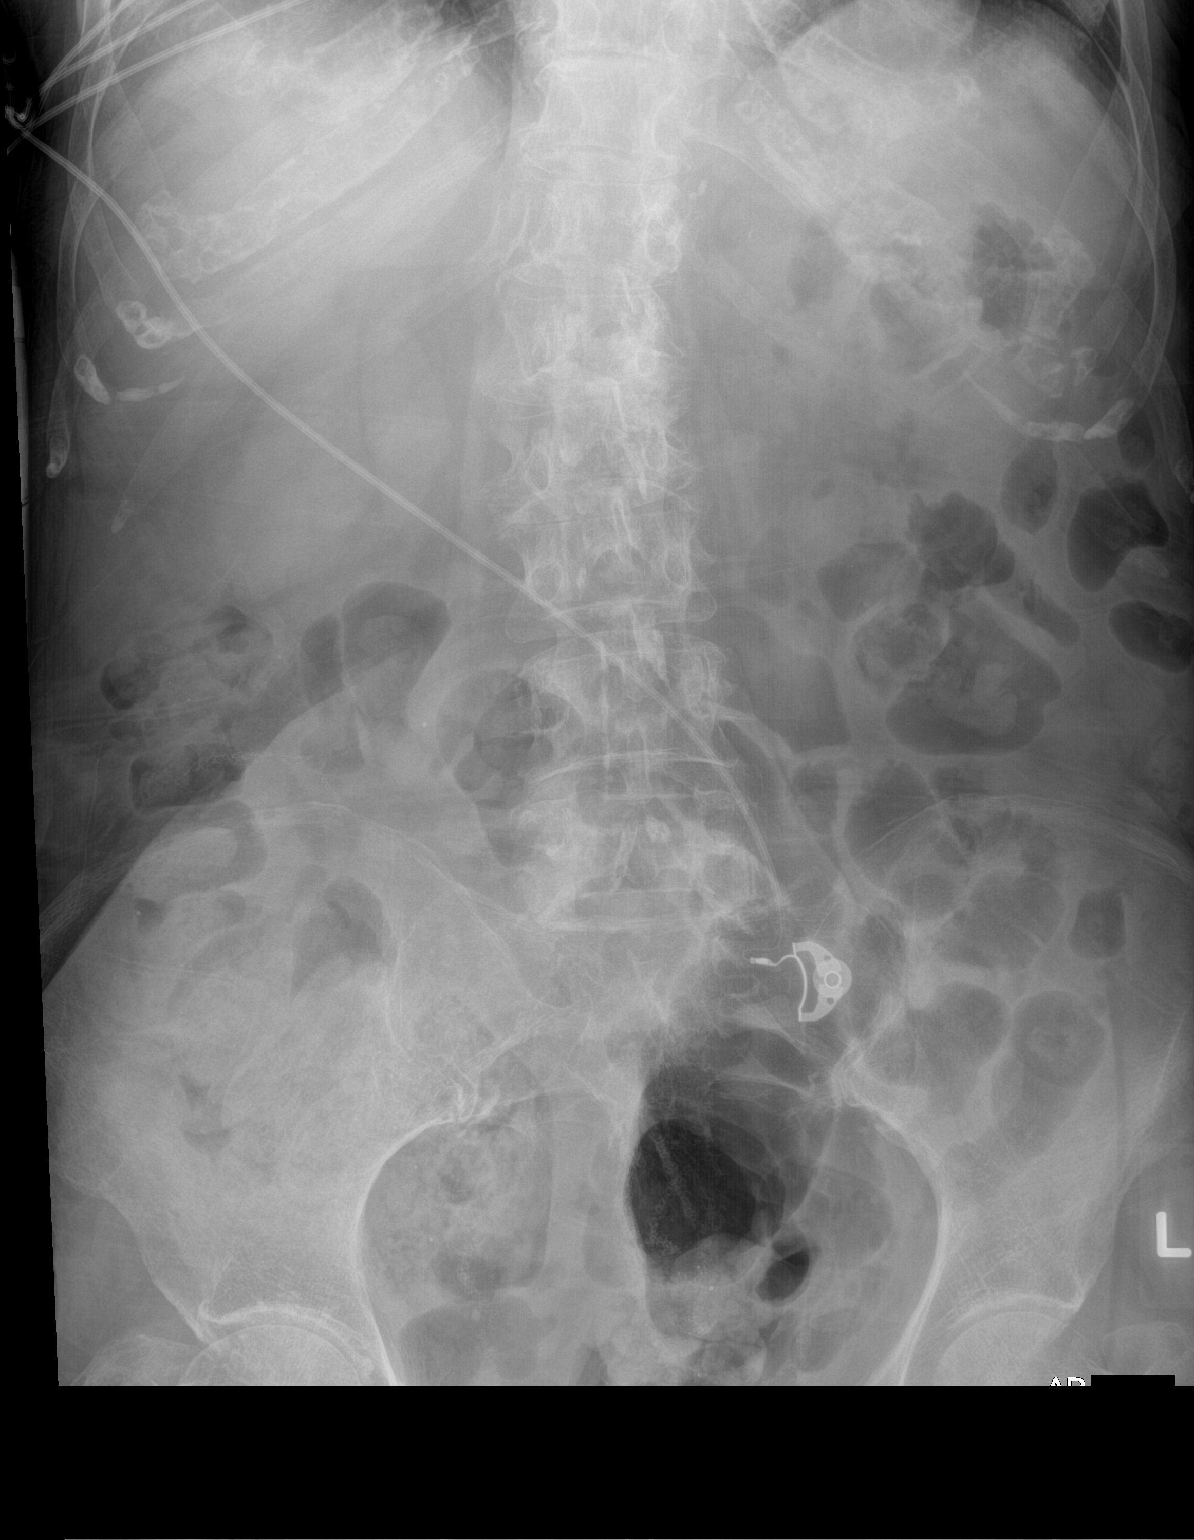

[1 of 1 positions shown; findings below may reference images not displayed]

FINDINGS: Stable bowel gas pattern with moderate stool in the colon and down
into the rectum and scattered air-filled loops of small bowel
without distention. No obvious free air. The soft tissue shadows of
the abdomen are grossly maintained. The bony structures are intact.
IMPRESSION: Stable nonobstructive bowel gas pattern.  Probable mild ileus.
